# Patient Record
Sex: Male | Born: 1940
Health system: Southern US, Community
[De-identification: ages and names within clinical notes are randomized; demographics above are authoritative.]

## PROBLEM LIST (undated history)

## (undated) DIAGNOSIS — R001 Bradycardia, unspecified: Secondary | ICD-10-CM

## (undated) DIAGNOSIS — C20 Malignant neoplasm of rectum: Secondary | ICD-10-CM

## (undated) DIAGNOSIS — K635 Polyp of colon: Secondary | ICD-10-CM

## (undated) DIAGNOSIS — I10 Essential (primary) hypertension: Secondary | ICD-10-CM

## (undated) DIAGNOSIS — Z803 Family history of malignant neoplasm of breast: Secondary | ICD-10-CM

## (undated) DIAGNOSIS — Z9289 Personal history of other medical treatment: Secondary | ICD-10-CM

## (undated) DIAGNOSIS — E7800 Pure hypercholesterolemia, unspecified: Secondary | ICD-10-CM

## (undated) DIAGNOSIS — I480 Paroxysmal atrial fibrillation: Secondary | ICD-10-CM

## (undated) DIAGNOSIS — K219 Gastro-esophageal reflux disease without esophagitis: Secondary | ICD-10-CM

## (undated) DIAGNOSIS — G478 Other sleep disorders: Secondary | ICD-10-CM

## (undated) DIAGNOSIS — R1013 Epigastric pain: Secondary | ICD-10-CM

## (undated) DIAGNOSIS — C189 Malignant neoplasm of colon, unspecified: Secondary | ICD-10-CM

## (undated) HISTORY — DX: Pure hypercholesterolemia, unspecified: E78.00

## (undated) HISTORY — DX: Personal history of other medical treatment: Z92.89

## (undated) HISTORY — DX: Family history of malignant neoplasm of breast: Z80.3

## (undated) HISTORY — DX: Essential (primary) hypertension: I10

## (undated) HISTORY — PX: TONSILLECTOMY: SUR1361

## (undated) HISTORY — DX: Epigastric pain: R10.13

## (undated) HISTORY — DX: Bradycardia, unspecified: R00.1

## (undated) HISTORY — DX: Paroxysmal atrial fibrillation: I48.0

## (undated) HISTORY — PX: CATARACT EXTRACTION: SUR2

## (undated) HISTORY — PX: COLON SURGERY: SHX602

## (undated) HISTORY — DX: Malignant neoplasm of colon, unspecified: C18.9

## (undated) HISTORY — DX: Malignant neoplasm of rectum: C20

---

## 1990-05-01 DIAGNOSIS — C189 Malignant neoplasm of colon, unspecified: Secondary | ICD-10-CM

## 1990-05-01 HISTORY — DX: Malignant neoplasm of colon, unspecified: C18.9

## 2000-11-09 ENCOUNTER — Encounter: Admission: RE | Admit: 2000-11-09 | Discharge: 2000-11-09 | Payer: Self-pay | Admitting: Oncology

## 2000-11-09 ENCOUNTER — Encounter (HOSPITAL_COMMUNITY): Admission: RE | Admit: 2000-11-09 | Discharge: 2000-12-09 | Payer: Self-pay | Admitting: Oncology

## 2001-11-13 ENCOUNTER — Encounter (HOSPITAL_COMMUNITY): Admission: RE | Admit: 2001-11-13 | Discharge: 2001-12-13 | Payer: Self-pay | Admitting: Oncology

## 2001-11-13 ENCOUNTER — Encounter: Admission: RE | Admit: 2001-11-13 | Discharge: 2001-11-13 | Payer: Self-pay | Admitting: Oncology

## 2002-11-17 ENCOUNTER — Encounter: Admission: RE | Admit: 2002-11-17 | Discharge: 2002-11-17 | Payer: Self-pay | Admitting: Oncology

## 2002-11-17 ENCOUNTER — Encounter (HOSPITAL_COMMUNITY): Admission: RE | Admit: 2002-11-17 | Discharge: 2002-12-17 | Payer: Self-pay | Admitting: Oncology

## 2003-12-23 ENCOUNTER — Encounter: Admission: RE | Admit: 2003-12-23 | Discharge: 2003-12-23 | Payer: Self-pay | Admitting: Oncology

## 2003-12-23 ENCOUNTER — Encounter (HOSPITAL_COMMUNITY): Admission: RE | Admit: 2003-12-23 | Discharge: 2004-01-22 | Payer: Self-pay | Admitting: Oncology

## 2004-12-22 ENCOUNTER — Encounter: Admission: RE | Admit: 2004-12-22 | Discharge: 2004-12-22 | Payer: Self-pay | Admitting: Oncology

## 2004-12-22 ENCOUNTER — Ambulatory Visit (HOSPITAL_COMMUNITY): Payer: Self-pay | Admitting: Oncology

## 2004-12-22 ENCOUNTER — Encounter (HOSPITAL_COMMUNITY): Admission: RE | Admit: 2004-12-22 | Discharge: 2005-01-21 | Payer: Self-pay | Admitting: Oncology

## 2005-12-27 ENCOUNTER — Encounter (HOSPITAL_COMMUNITY): Admission: RE | Admit: 2005-12-27 | Discharge: 2006-01-26 | Payer: Self-pay | Admitting: Oncology

## 2005-12-27 ENCOUNTER — Encounter: Admission: RE | Admit: 2005-12-27 | Discharge: 2006-01-26 | Payer: Self-pay | Admitting: Oncology

## 2005-12-27 ENCOUNTER — Ambulatory Visit (HOSPITAL_COMMUNITY): Payer: Self-pay | Admitting: Oncology

## 2007-01-22 ENCOUNTER — Ambulatory Visit (HOSPITAL_COMMUNITY): Payer: Self-pay | Admitting: Oncology

## 2007-01-22 ENCOUNTER — Encounter (HOSPITAL_COMMUNITY): Admission: RE | Admit: 2007-01-22 | Discharge: 2007-01-29 | Payer: Self-pay | Admitting: Oncology

## 2008-02-11 ENCOUNTER — Encounter (HOSPITAL_COMMUNITY): Admission: RE | Admit: 2008-02-11 | Discharge: 2008-03-12 | Payer: Self-pay | Admitting: Oncology

## 2008-02-11 ENCOUNTER — Ambulatory Visit (HOSPITAL_COMMUNITY): Payer: Self-pay | Admitting: Oncology

## 2009-02-09 ENCOUNTER — Encounter (HOSPITAL_COMMUNITY): Admission: RE | Admit: 2009-02-09 | Discharge: 2009-03-11 | Payer: Self-pay | Admitting: Oncology

## 2009-02-09 ENCOUNTER — Ambulatory Visit (HOSPITAL_COMMUNITY): Payer: Self-pay | Admitting: Oncology

## 2010-02-09 ENCOUNTER — Ambulatory Visit (HOSPITAL_COMMUNITY): Payer: Self-pay | Admitting: Oncology

## 2010-08-04 LAB — DIFFERENTIAL
Basophils Absolute: 0 10*3/uL (ref 0.0–0.1)
Lymphocytes Relative: 25 % (ref 12–46)
Lymphs Abs: 1.1 10*3/uL (ref 0.7–4.0)
Monocytes Absolute: 0.3 10*3/uL (ref 0.1–1.0)
Neutro Abs: 2.9 10*3/uL (ref 1.7–7.7)

## 2010-08-04 LAB — COMPREHENSIVE METABOLIC PANEL
Albumin: 4.1 g/dL (ref 3.5–5.2)
BUN: 7 mg/dL (ref 6–23)
Calcium: 9 mg/dL (ref 8.4–10.5)
Chloride: 107 mEq/L (ref 96–112)
Creatinine, Ser: 0.83 mg/dL (ref 0.4–1.5)
Potassium: 3.9 mEq/L (ref 3.5–5.1)
Sodium: 139 mEq/L (ref 135–145)
Total Bilirubin: 0.8 mg/dL (ref 0.3–1.2)
Total Protein: 6.2 g/dL (ref 6.0–8.3)

## 2010-08-04 LAB — LIPID PANEL
HDL: 42 mg/dL (ref >39)
Total CHOL/HDL Ratio: 4.7 RATIO
Triglycerides: 184 mg/dL — ABNORMAL HIGH (ref <150)

## 2010-08-04 LAB — CBC
HCT: 41 % (ref 39.0–52.0)
Hemoglobin: 14.2 g/dL (ref 13.0–17.0)
RBC: 4.45 MIL/uL (ref 4.22–5.81)
WBC: 4.4 10*3/uL (ref 4.0–10.5)

## 2011-01-30 LAB — OCCULT BLOOD X 1 CARD TO LAB, STOOL
Fecal Occult Bld: NEGATIVE
Fecal Occult Bld: NEGATIVE

## 2011-01-30 LAB — DIFFERENTIAL
Eosinophils Relative: 2
Lymphocytes Relative: 24
Lymphs Abs: 1.1
Monocytes Absolute: 0.3
Monocytes Relative: 7

## 2011-01-30 LAB — CBC
MCHC: 34.6
MCV: 89.8
Platelets: 184
WBC: 4.6

## 2011-01-30 LAB — LIPID PANEL
HDL: 35 — ABNORMAL LOW
Total CHOL/HDL Ratio: 5

## 2011-01-30 LAB — COMPREHENSIVE METABOLIC PANEL
AST: 16
Albumin: 4
Calcium: 9.1
Creatinine, Ser: 0.89
GFR calc Af Amer: >60

## 2011-02-08 ENCOUNTER — Encounter (HOSPITAL_COMMUNITY): Payer: Self-pay | Admitting: Oncology

## 2011-02-08 ENCOUNTER — Encounter (HOSPITAL_COMMUNITY): Payer: Medicare Other | Attending: Oncology | Admitting: Oncology

## 2011-02-08 DIAGNOSIS — E7800 Pure hypercholesterolemia, unspecified: Secondary | ICD-10-CM

## 2011-02-08 DIAGNOSIS — R197 Diarrhea, unspecified: Secondary | ICD-10-CM

## 2011-02-08 DIAGNOSIS — Z79899 Other long term (current) drug therapy: Secondary | ICD-10-CM | POA: Insufficient documentation

## 2011-02-08 DIAGNOSIS — Z85048 Personal history of other malignant neoplasm of rectum, rectosigmoid junction, and anus: Secondary | ICD-10-CM | POA: Insufficient documentation

## 2011-02-08 DIAGNOSIS — C189 Malignant neoplasm of colon, unspecified: Secondary | ICD-10-CM

## 2011-02-08 DIAGNOSIS — Z09 Encounter for follow-up examination after completed treatment for conditions other than malignant neoplasm: Secondary | ICD-10-CM | POA: Insufficient documentation

## 2011-02-08 LAB — CBC
MCH: 30.5 pg (ref 26.0–34.0)
MCV: 91.2 fL (ref 78.0–100.0)
Platelets: 184 10*3/uL (ref 150–400)
RBC: 4.75 MIL/uL (ref 4.22–5.81)
RDW: 12.9 % (ref 11.5–15.5)

## 2011-02-08 LAB — LIPID PANEL
Cholesterol: 191 mg/dL (ref 0–200)
HDL: 47 mg/dL (ref >39)
LDL Cholesterol: 109 mg/dL — ABNORMAL HIGH (ref 0–99)
Triglycerides: 177 mg/dL — ABNORMAL HIGH (ref <150)

## 2011-02-08 LAB — DIFFERENTIAL
Basophils Absolute: 0 10*3/uL (ref 0.0–0.1)
Basophils Relative: 0 % (ref 0–1)
Monocytes Relative: 5 % (ref 3–12)
Neutro Abs: 3.5 10*3/uL (ref 1.7–7.7)
Neutrophils Relative %: 63 % (ref 43–77)

## 2011-02-08 LAB — COMPREHENSIVE METABOLIC PANEL
Albumin: 4 g/dL (ref 3.5–5.2)
BUN: 6 mg/dL (ref 6–23)
Chloride: 106 mEq/L (ref 96–112)
Creatinine, Ser: 0.81 mg/dL (ref 0.50–1.35)
Total Bilirubin: 0.3 mg/dL (ref 0.3–1.2)

## 2011-02-08 NOTE — Patient Instructions (Signed)
[facility]  Discharge Instructions  RECOMMENDATIONS MADE BY THE CONSULTANT AND ANY TEST RESULTS WILL BE SENT TO YOUR REFERRING DOCTOR.   EXAM FINDINGS BY MD TODAY AND SIGNS AND SYMPTOMS TO REPORT TO CLINIC OR PRIMARY MD: doing well  MEDICATIONS PRESCRIBED: lomotil Follow label directions  INSTRUCTIONS GIVEN AND DISCUSSED: Other: report bowel changes, blood in stool, etc  SPECIAL INSTRUCTIONS/FOLLOW-UP: Return to Clinic in 1 year.   I acknowledge that I have been informed and understand all the instructions given to me and received a copy. I do not have any more questions at this time, but understand that I may call the Specialty Clinic at [facility] at [phone] during business hours should I have any further questions or need assistance in obtaining follow-up care.    __________________________________________  _____________  __________ Signature of Patient or Authorized Representative            Date                   Time    __________________________________________ Nurse's Signature

## 2011-02-08 NOTE — Progress Notes (Signed)
CC:   Arthur Flavin, MD Arthur Obrien, M.D.  DIAGNOSES: 1. Stage III cancer of the rectum with resection earlier in 1992,     followed by combination chemotherapy with 5-FU, leucovorin and     levamisole in conjunction with radiation therapy to the pelvis by     Dr. Kathrin Greathouse, and he has had no evidence of recurrence thus     far.  His last colonoscopy was by Dr. Elmyra Ricks in November     2010 and for his next one, he would like to see Dr. Lionel Obrien.     Dr. Cleotis Nipper told him it will be 5 years from 2010. 2. Mild hypercholesterolemia.  His medications include just some red     yeast rice for this and he takes some fish oil at times. 3. Gaseous distention of his abdomen intermittently, for which he     takes Prilosec at times. 4. Diarrhea since his rectal surgery and radiation therapy, treated     with p.r.n. Lomotil, which works very well. 5. Minimal excess weight, unchanged. 6. Right cataract operation and lens implant in the past as well as     left implant in 2011. 7. Right distal index finger infection secondary to a laceration from     glass that is better, but he did get a tetanus toxoid booster in     October 2010. Arthur Obrien's review of systems oncologically is totally negative.  PHYSICAL EXAMINATION:  His vital signs are fine.  He still working 3 days weeks, soon to go to 2 days a week.  He weighs 196 pounds, which is where he has been really for quite some time.  He is 5 feet 10-1/2 inches is tall, and his BMI is 27.9.  Blood pressure today 165/83.  His blood pressures are occasionally high when he comes here due to mild anxiety.  Respirations 15 and unlabored.  Pulse 60 and regular.  He is afebrile.  He has no peripheral edema.  He is in no acute distress.  He has no thyromegaly.  No adenopathy in any location.  He has no gynecomastia.  Lungs:  Clear to auscultation and percussion.  Heart:  A regular rhythm and rate without murmur, rub or gallop.  Abdomen:   Soft and nontender with normal bowel sounds, no hepatosplenomegaly, no distention, no ascites.   He looks really quite good.  We will get some blood work on him, ship it to him and Dr. Dimas Aguas.  We will see him ourselves in a year, sooner if need be.    ______________________________ Ladona Horns. Mariel Sleet, MD ESN/MEDQ  D:  02/08/2011  T:  02/08/2011  Job:  [id number]

## 2011-02-08 NOTE — Progress Notes (Signed)
This office note has been dictated.

## 2011-02-09 LAB — COMPREHENSIVE METABOLIC PANEL
AST: 16
Albumin: 4
CO2: 29
Calcium: 9
Creatinine, Ser: 0.85
GFR calc Af Amer: >60
GFR calc non Af Amer: >60

## 2011-02-09 LAB — LIPID PANEL
HDL: 38 — ABNORMAL LOW
Triglycerides: 209 — ABNORMAL HIGH

## 2011-02-09 LAB — CBC
HCT: 40.7
MCHC: 34.6
MCV: 89.3
Platelets: 201
RDW: 14.1 — ABNORMAL HIGH

## 2011-02-09 LAB — DIFFERENTIAL
Eosinophils Relative: 1
Lymphocytes Relative: 25
Lymphs Abs: 1.1

## 2011-02-09 LAB — PSA: PSA: 0.28

## 2011-02-10 ENCOUNTER — Telehealth (HOSPITAL_COMMUNITY): Payer: Self-pay

## 2011-02-10 NOTE — Telephone Encounter (Signed)
Lab results faxed to Dr. Dimas Aguas per MD and patient's request.

## 2011-10-12 DIAGNOSIS — J342 Deviated nasal septum: Secondary | ICD-10-CM | POA: Diagnosis not present

## 2011-10-12 DIAGNOSIS — R040 Epistaxis: Secondary | ICD-10-CM | POA: Diagnosis not present

## 2012-01-16 ENCOUNTER — Other Ambulatory Visit (HOSPITAL_COMMUNITY): Payer: Self-pay | Admitting: Oncology

## 2012-01-16 DIAGNOSIS — R197 Diarrhea, unspecified: Secondary | ICD-10-CM

## 2012-01-16 MED ORDER — DIPHENOXYLATE-ATROPINE 2.5-0.025 MG PO TABS: ORAL_TABLET | ORAL | Status: DC

## 2012-01-18 DIAGNOSIS — H26499 Other secondary cataract, unspecified eye: Secondary | ICD-10-CM | POA: Diagnosis not present

## 2012-02-07 ENCOUNTER — Ambulatory Visit (HOSPITAL_COMMUNITY): Payer: Medicare Other | Admitting: Oncology

## 2012-02-08 ENCOUNTER — Encounter (HOSPITAL_COMMUNITY): Payer: Self-pay | Admitting: Oncology

## 2012-02-08 ENCOUNTER — Encounter (HOSPITAL_COMMUNITY): Payer: Medicare Other | Attending: Oncology | Admitting: Oncology

## 2012-02-08 VITALS — BP 154/71 | HR 60 | Temp 98.2°F | Resp 18 | Ht 71.0 in | Wt 201.6 lb

## 2012-02-08 DIAGNOSIS — Z85048 Personal history of other malignant neoplasm of rectum, rectosigmoid junction, and anus: Secondary | ICD-10-CM | POA: Insufficient documentation

## 2012-02-08 DIAGNOSIS — R197 Diarrhea, unspecified: Secondary | ICD-10-CM | POA: Insufficient documentation

## 2012-02-08 DIAGNOSIS — Z09 Encounter for follow-up examination after completed treatment for conditions other than malignant neoplasm: Secondary | ICD-10-CM | POA: Diagnosis not present

## 2012-02-08 DIAGNOSIS — C20 Malignant neoplasm of rectum: Secondary | ICD-10-CM

## 2012-02-08 DIAGNOSIS — E7800 Pure hypercholesterolemia, unspecified: Secondary | ICD-10-CM | POA: Diagnosis not present

## 2012-02-08 HISTORY — DX: Malignant neoplasm of rectum: C20

## 2012-02-08 HISTORY — DX: Pure hypercholesterolemia, unspecified: E78.00

## 2012-02-08 LAB — CBC WITH DIFFERENTIAL/PLATELET
Basophils Absolute: 0 10*3/uL (ref 0.0–0.1)
Basophils Relative: 0 % (ref 0–1)
Eosinophils Absolute: 0.1 10*3/uL (ref 0.0–0.7)
Eosinophils Relative: 2 % (ref 0–5)
MCH: 30.8 pg (ref 26.0–34.0)
MCHC: 34.2 g/dL (ref 30.0–36.0)
MCV: 90 fL (ref 78.0–100.0)
Platelets: 165 10*3/uL (ref 150–400)
RDW: 13 % (ref 11.5–15.5)
WBC: 5.1 10*3/uL (ref 4.0–10.5)

## 2012-02-08 LAB — LIPID PANEL
Cholesterol: 196 mg/dL (ref 0–200)
HDL: 43 mg/dL (ref >39)
LDL Cholesterol: 107 mg/dL — ABNORMAL HIGH (ref 0–99)
Triglycerides: 229 mg/dL — ABNORMAL HIGH (ref <150)
VLDL: 46 mg/dL — ABNORMAL HIGH (ref 0–40)

## 2012-02-08 LAB — COMPREHENSIVE METABOLIC PANEL
ALT: 14 U/L (ref 0–53)
AST: 14 U/L (ref 0–37)
Calcium: 9.2 mg/dL (ref 8.4–10.5)
Sodium: 140 mEq/L (ref 135–145)
Total Protein: 6.5 g/dL (ref 6.0–8.3)

## 2012-02-08 MED ORDER — DIPHENOXYLATE-ATROPINE 2.5-0.025 MG PO TABS: ORAL_TABLET | ORAL | Status: DC

## 2012-02-08 NOTE — Progress Notes (Signed)
No primary provider on file. No primary provider on file.  1. Rectal cancer  diphenoxylate-atropine (LOMOTIL) 2.5-0.025 MG per tablet  2. Hypercholesterolemia  Lipid panel  3. Diarrhea  diphenoxylate-atropine (LOMOTIL) 2.5-0.025 MG per tablet    CURRENT THERAPY: Observation  INTERVAL HISTORY: Arthur Obrien 71 y.o. male returns for  regular  Obrien for followup of Stage III cancer of the rectum with resection earlier in 1992, followed by combination chemotherapy with 5-FU, leucovorin and levamisole in conjunction with radiation therapy to the pelvis by Dr. Kathrin Greathouse, and he has had no evidence of recurrence thus far. His last colonoscopy was by Dr. Elmyra Ricks in November 2010 and for his next one, he would like to see Dr. Lionel December. Dr. Cleotis Nipper told him it will be 5 years from 2010.   Arthur Obrien is doing well.  He continues to have chronic loose stools.  He reports that he uses Lomotil to help with that, particularly when he is traveling.  This controls his loose stools.    Otherwise, he denies any changes in his stool size, color, caliber.  He denies any blood in his stool and black tarry stools.  He denies any "pencil-thin" stools.   Complete ROS questioning is negative.  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     Past Medical History  Diagnosis Date  . Colon cancer 1992    rectal stage 3  . Hypercholesteremia     mild  . Rectal cancer 02/08/2012    Stage III cancer of the rectum with resection earlier in 1992, followed by combination chemotherapy with 5-FU, leucovorin and levamisole in conjunction with radiation therapy to the pelvis by Dr. Kathrin Greathouse, and he has had no evidence of recurrence thus far. His last colonoscopy was by Dr. Elmyra Ricks in November 2010 and for his next one, he would like to see Dr. Lionel December.  Dr. Carley Hammed  . Hypercholesterolemia 02/08/2012    has Rectal cancer and  Hypercholesterolemia on his problem list.      has no known allergies.  Arthur Obrien.  Past Surgical History  Procedure Date  . Cataract extraction     right(years ago) left 06/2010    Denies any headaches, dizziness, double vision, fevers, chills, night sweats, nausea, vomiting, diarrhea, constipation, chest pain, heart palpitations, shortness of breath, blood in stool, black tarry stool, urinary pain, urinary burning, urinary frequency, hematuria.   PHYSICAL EXAMINATION  ECOG PERFORMANCE STATUS: 0 - Asymptomatic  Filed Vitals:   02/08/12 0900  BP: 154/71  Pulse: 60  Temp: 98.2 F (36.8 C)  Resp: 18    GENERAL:alert, healthy, no distress, well nourished, well developed, comfortable, cooperative and smiling SKIN: skin color, texture, turgor are normal, no rashes or significant lesions HEAD: Normocephalic, No masses, lesions, tenderness or abnormalities EYES: normal, Conjunctiva are pink and non-injected EARS: External ears normal OROPHARYNX:lips, buccal mucosa, and tongue normal and mucous membranes are moist  NECK: supple, no adenopathy, thyroid normal size, non-tender, without nodularity, no stridor, non-tender, trachea midline LYMPH:  no palpable lymphadenopathy, no hepatosplenomegaly BREAST:not examined LUNGS: clear to auscultation and percussion HEART: regular rate & rhythm, no murmurs, no gallops, S1 normal and S2 normal ABDOMEN:abdomen soft, non-tender, normal bowel sounds, no masses or organomegaly and no hepatosplenomegaly BACK: Back symmetric, no curvature., No CVA tenderness EXTREMITIES:less then 2 second capillary refill, no joint deformities, effusion, or inflammation, no edema,  no skin discoloration, no clubbing, no cyanosis  NEURO: alert & oriented x 3 with fluent speech, no focal motor/sensory deficits, gait normal     ASSESSMENT:  1. Stage III cancer of the rectum with resection earlier in 1992, followed  by combination chemotherapy with 5-FU, leucovorin and levamisole in conjunction with radiation therapy to the pelvis by Dr. Kathrin Greathouse, and he has had no evidence of recurrence thus far. His last colonoscopy was by Dr. Elmyra Ricks in November 2010 and for his next one, he would like to see Dr. Lionel December. Dr. Cleotis Nipper told him it will be 5 years from 2010.  2. Mild hypercholesterolemia. His medications include just some red yeast rice for this and he takes some fish oil at times.  3. Gaseous distention of his abdomen intermittently, for which he takes Prilosec at times.  4. Diarrhea since his rectal surgery and radiation therapy, treated with p.r.n. Lomotil, which works very well.  5. Minimal excess weight, unchanged.  6. Right cataract operation and lens implant in the past as well as left implant in 2011.  7. Right distal index finger infection secondary to a laceration from glass that is better, but he did get a tetanus toxoid booster in October 2010.   PLAN:  1. I personally reviewed and went over laboratory results with the patient. 2. Lab work today: CBC diff, CMET, CEA. 3. Due for screening colonoscopy in 2015.  He would like to see Dr. Karilyn Cota for this procedure.  4. Rx for Lomotil #100 with 4 refills for his diarrhea.  5. Return in 1 year for follow-up.    All questions were answered. The patient knows to call the clinic with any problems, questions or concerns. We can certainly see the patient much sooner if necessary.  Arthur Obrien

## 2012-02-08 NOTE — Patient Instructions (Addendum)
[facility]  Discharge Instructions  RECOMMENDATIONS MADE BY THE CONSULTANT AND ANY TEST RESULTS WILL BE SENT TO YOUR REFERRING DOCTOR.   Lab work today. We will call you if there are any abnormal lab results. RX given to pt for Lomotil. Return to clinic in 1 year. Report any issues/concerns to clinic as needed.   I acknowledge that I have been informed and understand all the instructions given to me and received a copy. I do not have any more questions at this time, but understand that I may call the Specialty Clinic at [facility] at [phone] during business hours should I have any further questions or need assistance in obtaining follow-up care.    __________________________________________  _____________  __________ Signature of Patient or Authorized Representative            Date                   Time    __________________________________________ Nurse's Signature

## 2012-02-12 DIAGNOSIS — Z23 Encounter for immunization: Secondary | ICD-10-CM | POA: Diagnosis not present

## 2012-03-05 DIAGNOSIS — R7309 Other abnormal glucose: Secondary | ICD-10-CM | POA: Diagnosis not present

## 2012-03-05 DIAGNOSIS — I10 Essential (primary) hypertension: Secondary | ICD-10-CM | POA: Diagnosis not present

## 2012-03-05 DIAGNOSIS — E7800 Pure hypercholesterolemia, unspecified: Secondary | ICD-10-CM | POA: Diagnosis not present

## 2012-05-23 DIAGNOSIS — E039 Hypothyroidism, unspecified: Secondary | ICD-10-CM | POA: Diagnosis not present

## 2012-05-23 DIAGNOSIS — R002 Palpitations: Secondary | ICD-10-CM | POA: Diagnosis not present

## 2012-05-23 DIAGNOSIS — Z0000 Encounter for general adult medical examination without abnormal findings: Secondary | ICD-10-CM | POA: Diagnosis not present

## 2012-06-26 DIAGNOSIS — Z808 Family history of malignant neoplasm of other organs or systems: Secondary | ICD-10-CM | POA: Diagnosis not present

## 2012-06-26 DIAGNOSIS — Z85038 Personal history of other malignant neoplasm of large intestine: Secondary | ICD-10-CM | POA: Diagnosis not present

## 2012-06-26 DIAGNOSIS — R079 Chest pain, unspecified: Secondary | ICD-10-CM | POA: Diagnosis not present

## 2012-06-26 DIAGNOSIS — Z8489 Family history of other specified conditions: Secondary | ICD-10-CM | POA: Diagnosis not present

## 2012-06-26 DIAGNOSIS — Z79899 Other long term (current) drug therapy: Secondary | ICD-10-CM | POA: Diagnosis not present

## 2012-06-26 DIAGNOSIS — I4891 Unspecified atrial fibrillation: Secondary | ICD-10-CM | POA: Diagnosis not present

## 2012-08-21 ENCOUNTER — Encounter: Payer: Self-pay | Admitting: Internal Medicine

## 2012-09-26 ENCOUNTER — Ambulatory Visit (INDEPENDENT_AMBULATORY_CARE_PROVIDER_SITE_OTHER): Payer: Medicare Other | Admitting: Internal Medicine

## 2012-09-26 ENCOUNTER — Encounter: Payer: Self-pay | Admitting: Internal Medicine

## 2012-09-26 VITALS — BP 158/80 | HR 62 | Ht 71.0 in | Wt 200.8 lb

## 2012-09-26 DIAGNOSIS — I4891 Unspecified atrial fibrillation: Secondary | ICD-10-CM

## 2012-09-26 DIAGNOSIS — I498 Other specified cardiac arrhythmias: Secondary | ICD-10-CM

## 2012-09-26 DIAGNOSIS — R0609 Other forms of dyspnea: Secondary | ICD-10-CM

## 2012-09-26 DIAGNOSIS — C20 Malignant neoplasm of rectum: Secondary | ICD-10-CM | POA: Diagnosis not present

## 2012-09-26 DIAGNOSIS — E7800 Pure hypercholesterolemia, unspecified: Secondary | ICD-10-CM | POA: Diagnosis not present

## 2012-09-26 DIAGNOSIS — I480 Paroxysmal atrial fibrillation: Secondary | ICD-10-CM

## 2012-09-26 DIAGNOSIS — R001 Bradycardia, unspecified: Secondary | ICD-10-CM

## 2012-09-26 DIAGNOSIS — R0683 Snoring: Secondary | ICD-10-CM

## 2012-09-26 HISTORY — DX: Bradycardia, unspecified: R00.1

## 2012-09-26 HISTORY — DX: Paroxysmal atrial fibrillation: I48.0

## 2012-09-26 MED ORDER — PROPRANOLOL HCL 10 MG PO TABS: ORAL_TABLET | ORAL | Status: DC

## 2012-09-26 NOTE — Assessment & Plan Note (Addendum)
The patient has largely asymptomatic paroxysmal atrial fibrillation not withstanding the quite rapid rate. He has bradycardia which we'll make rate control difficult. We will use when necessary beta blockers to help when necessary. In the case that this is inadequate,  Would consider the use of dofetilide as an antiarrhythmic drug which is unassociated with significant heart rate slowing.  the use of a 1C requires AV nodal blocking agents and in him  This would be problematic. In addition, his left atrium of 47 mm might make these drugs less likely effective. In the event that symptoms persisted despite the use of dofetilide, I would have a low threshold for considering catheter ablation.  We also discussed anticoagulation. His early discussions with Dr. GD I suggested that he should not be on anticoagulation. His  chads2 score is hard to tell because his age is 49 and his blood pressure is borderline. His CHADS-VASc score is 1+.. I would have a low threshold for the use of anticoagulation but will hold off based on prior discussions he had with Dr. GD right now

## 2012-09-26 NOTE — Progress Notes (Signed)
ELECTROPHYSIOLOGY CONSULT NOTE  Patient ID: Arthur Obrien, MRN: [id number], DOB/AGE: 1941-04-02 72 y.o. Admit date: (Not on file) Date of Consult: 09/26/2012  Primary Physician: No primary provider on file. Primary Cardiologist GD  Chief Complaint: afib   HPI Arthur Obrien is a 72 y.o. male  ( note written days after visit>>A&P written day pt was here With a history of afib with rapid rates and complicated by sinus bradycardia  He had been seeing dr GD until he left, and there were plans for sleep study Episodes of atrial fibrillation relatively asymptomatic  TERF age-13>CHADS almost 1  Prior.  Cardiac eval>> EF normal and LA 47 mm     * Past Medical History  Diagnosis Date  . Colon cancer 1992    rectal stage 3  . Hypercholesteremia     mild  . Rectal cancer 02/08/2012    Stage III cancer of the rectum with resection earlier in 1992, followed by combination chemotherapy with 5-FU, leucovorin and levamisole in conjunction with radiation therapy to the pelvis by Dr. Kathrin Greathouse, and he has had no evidence of recurrence thus far. His last colonoscopy was by Dr. Elmyra Ricks in November 2010 and for his next one, he would like to see Dr. Lionel December.  Dr. Carley Hammed  . Hypercholesterolemia 02/08/2012      Surgical History:  Past Surgical History  Procedure Laterality Date  . Cataract extraction      right(years ago) left 06/2010     Home Meds: Prior to Admission medications   Medication Sig Start Date End Date Taking? Authorizing Provider  cyanocobalamin 1000 MCG tablet Take 1,000 mcg by mouth daily.   Yes Historical Provider, MD  diltiazem (CARDIZEM) 30 MG tablet Take 30 mg by mouth as needed.   Yes Historical Provider, MD  diphenoxylate-atropine (LOMOTIL) 2.5-0.025 MG per tablet Take 1-2 tablets PO every 6 hours PRN diarrhea 02/08/12  Yes Maurine Minister Kefalas, PA-C  ibuprofen (ADVIL,MOTRIN) 800 MG tablet Take 800 mg by mouth every 8 (eight) hours as needed.     Yes  Historical Provider, MD  Multiple Vitamin (MULTIVITAMIN) tablet Take 1 tablet by mouth daily.     Yes Historical Provider, MD      Allergies: No Known Allergies  History   Social History  . Marital Status: Married    Spouse Name: Arthur Obrien    Number of Children: Arthur Obrien  . Years of Education: Arthur Obrien   Occupational History  . Not on file.   Social History Main Topics  . Smoking status: Former Games developer  . Smokeless tobacco: Never Used  . Alcohol Use: No  . Drug Use: No  . Sexually Active: No   Other Topics Concern  . Not on file   Social History Narrative  . No narrative on file     Family History  Problem Relation Age of Onset  . Cancer Father     multiple myeloma  . Cancer Daughter     metastatic breast     ROS:  Please see the history of present illness.     All other systems reviewed and negative.    Physical Exam:*   Blood pressure 158/80, pulse 62, height 5\' 11"  (1.803 m), weight 200 lb 12.8 oz (91.082 kg). General: Well developed, well nourished male in no acute distress. Head: Normocephalic, atraumatic, sclera non-icteric, no xanthomas, nares are without discharge. EENT: normal Lymph Nodes:  none Back: without scoliosis/kyphosis, no CVA tendersness Neck: Negative for carotid bruits. JVD not  elevated. Lungs: Clear bilaterally to auscultation without wheezes, rales, or rhonchi. Breathing is unlabored. Heart: RRR with S1 S2. No  murmur , rubs, or gallops appreciated. Abdomen: Soft, non-tender, non-distended with normoactive bowel sounds. No hepatomegaly. No rebound/guarding. No obvious abdominal masses. Msk:  Strength and tone appear normal for age. Extremities: No clubbing or cyanosis. No  edema.  Distal pedal pulses are 2+ and equal bilaterally. Skin: Warm and Dry Neuro: Alert and oriented X 3. CN III-XII intact Grossly normal sensory and motor function . Psych:  Responds to questions appropriately with a normal affect.      Labs: Cardiac Enzymes No results  found for this basename: CKTOTAL, CKMB, TROPONINI,  in the last 72 hours CBC Lab Results  Component Value Date   WBC 5.1 02/08/2012   HGB 14.4 02/08/2012   HCT 42.1 02/08/2012   MCV 90.0 02/08/2012   PLT 165 02/08/2012   PROTIME: No results found for this basename: LABPROT, INR,  in the last 72 hours Chemistry No results found for this basename: NA, K, CL, CO2, BUN, CREATININE, CALCIUM, LABALBU, PROT, BILITOT, ALKPHOS, ALT, AST, GLUCOSE,  in the last 168 hours Lipids Lab Results  Component Value Date   CHOL 196 02/08/2012   HDL 43 02/08/2012   LDLCALC 107* 02/08/2012   TRIG 229* 02/08/2012   BNP No results found for this basename: probnp   Miscellaneous No results found for this basename: DDIMER    Radiology/Studies:  No results found.  EKG: sinus rhythm at 62 Intervals 16/08/39 Left atrial enlargement  Echocardiogram 4/14 normal left ventricular function left atrial dimension 47 Holter monitor/event recorder paroxysmal atrial fibrillation with heart rates ranging from 45 at night to 210 during the day. Also has sinus rhythm with rates in the 50s during the day   Assessment and Plan:    Sherryl Manges

## 2012-09-26 NOTE — Assessment & Plan Note (Signed)
As above.

## 2012-09-26 NOTE — Patient Instructions (Addendum)
Your physician has recommended that you have a sleep study in [place]. This test records several body functions during sleep, including: brain activity, eye movement, oxygen and carbon dioxide blood levels, heart rate and rhythm, breathing rate and rhythm, the flow of air through your mouth and nose, snoring, body muscle movements, and chest and belly movement.  Your physician has recommended you make the following change in your medication:  1) start inderal (propranolol) 10 mg one tablet as needed  Your physician recommends that you schedule a follow-up appointment in: 3 months with Dr. Graciela Husbands.

## 2012-10-15 ENCOUNTER — Telehealth: Payer: Self-pay | Admitting: Internal Medicine

## 2012-10-15 NOTE — Telephone Encounter (Signed)
New Prob    Pt has some questions regarding an appt he has scheduled for 6/23. Appt looks like its coming from pulmonary, however, Dr. Koren Bound name is in the appointment notes. Please call.

## 2012-10-15 NOTE — Telephone Encounter (Signed)
Spoke with pt, he is scheduled for PFT's on 6/23. He has not had his sleep study as of yet and he wonders if this was to be for his son instead of him. Left message for dr Caryn Bee howard's office to call me back to discuss.

## 2012-10-16 DIAGNOSIS — Z85038 Personal history of other malignant neoplasm of large intestine: Secondary | ICD-10-CM | POA: Diagnosis not present

## 2012-10-16 DIAGNOSIS — I4891 Unspecified atrial fibrillation: Secondary | ICD-10-CM | POA: Diagnosis not present

## 2012-10-16 DIAGNOSIS — Z6828 Body mass index (BMI) 28.0-28.9, adult: Secondary | ICD-10-CM | POA: Diagnosis not present

## 2012-10-16 DIAGNOSIS — G4710 Hypersomnia, unspecified: Secondary | ICD-10-CM | POA: Diagnosis not present

## 2012-10-16 DIAGNOSIS — Z79899 Other long term (current) drug therapy: Secondary | ICD-10-CM | POA: Diagnosis not present

## 2012-10-16 DIAGNOSIS — G4761 Periodic limb movement disorder: Secondary | ICD-10-CM | POA: Diagnosis not present

## 2012-10-17 NOTE — Telephone Encounter (Signed)
F/u   Pt called again and stated dr Dimas Aguas did not schedule this appt

## 2012-10-17 NOTE — Telephone Encounter (Signed)
Discussed with dr Graciela Husbands, this pt does not need PFT's. Left message for pt that appt has been cancelled.

## 2012-12-04 ENCOUNTER — Other Ambulatory Visit: Payer: Self-pay

## 2012-12-27 ENCOUNTER — Ambulatory Visit (INDEPENDENT_AMBULATORY_CARE_PROVIDER_SITE_OTHER): Payer: Medicare Other | Admitting: Internal Medicine

## 2012-12-27 ENCOUNTER — Encounter: Payer: Self-pay | Admitting: Internal Medicine

## 2012-12-27 VITALS — BP 162/74 | HR 58 | Ht 71.0 in | Wt 194.0 lb

## 2012-12-27 DIAGNOSIS — R001 Bradycardia, unspecified: Secondary | ICD-10-CM

## 2012-12-27 DIAGNOSIS — I4891 Unspecified atrial fibrillation: Secondary | ICD-10-CM

## 2012-12-27 DIAGNOSIS — R030 Elevated blood-pressure reading, without diagnosis of hypertension: Secondary | ICD-10-CM | POA: Diagnosis not present

## 2012-12-27 DIAGNOSIS — I498 Other specified cardiac arrhythmias: Secondary | ICD-10-CM | POA: Diagnosis not present

## 2012-12-27 NOTE — Patient Instructions (Addendum)
The current medical regimen is effective;  continue present plan and medications.  Follow up in 1 year with Dr Graciela Husbands.  You will receive a letter in the mail 2 months before you are due.  Please call us when you receive this letter to schedule your follow up appointment.

## 2012-12-27 NOTE — Progress Notes (Signed)
Patient has no care team.   HPI  Arthur Obrien is a 72 y.o. male Seen in followup for atrial fibrillation with a moderately rapid ventricular response in the setting of sinus bradycardia.  For thromboembolic risk factors are notable for borderline hypertension and borderline age 81 a CHADS-2 score of one  And a CHADS-VASc score of 2.   The patient denies chest pain, shortness of breath, nocturnal dyspnea, orthopnea or peripheral edema.  There have been no palpitations, lightheadedness or syncope.   His sleep study results are not available  Past Medical History  Diagnosis Date  . Colon cancer 1992    rectal stage 3  . Hypercholesteremia     mild  . Rectal cancer 02/08/2012    Stage III cancer of the rectum with resection earlier in 1992, followed by combination chemotherapy with 5-FU, leucovorin and levamisole in conjunction with radiation therapy to the pelvis by Dr. Kathrin Greathouse, and he has had no evidence of recurrence thus far. His last colonoscopy was by Dr. Elmyra Ricks in November 2010 and for his next one, he would like to see Dr. Lionel December.  Dr. Carley Hammed  . Hypercholesterolemia 02/08/2012  . Atrial fibrillation 09/26/2012  . Sinus bradycardia 09/26/2012    Past Surgical History  Procedure Laterality Date  . Cataract extraction      right(years ago) left 06/2010    Current Outpatient Prescriptions  Medication Sig Dispense Refill  . cyanocobalamin 1000 MCG tablet Take 1,000 mcg by mouth daily.      . diphenoxylate-atropine (LOMOTIL) 2.5-0.025 MG per tablet Take 1-2 tablets PO every 6 hours PRN diarrhea  100 tablet  4  . Multiple Vitamin (MULTIVITAMIN) tablet Take 1 tablet by mouth daily.        . propranolol (INDERAL) 10 MG tablet Take one tablet by mouth as needed  30 tablet  3   No current facility-administered medications for this visit.    No Known Allergies  Review of Systems negative except from HPI and PMH  Physical Exam BP 162/74  Pulse 58  Ht 5'  11" (1.803 m)  Wt 194 lb (87.998 kg)  BMI 27.07 kg/m2 Well developed and nourished in no acute distress HENT normal Neck supple with JVP-flat Clear Regular rate and rhythm, no murmurs or gallops Abd-soft with active BS No Clubbing cyanosis edema Skin-warm and dry A & Oriented  Grossly normal sensory and motor function  ECG demonstrates sinus rhythm at 58 Intervals 17/08/22 Axis XXXIV  Assessment and  Plan

## 2012-12-27 NOTE — Assessment & Plan Note (Signed)
Ihave asked him to follow outpatient blood pressures. In the past they have been in the range of 130-140/75-90 This would  have an impact on anticoagulation recommendations

## 2012-12-27 NOTE — Assessment & Plan Note (Signed)
Paroxysmal. Using when necessary beta blockers. We discussed anticoagulation and antiplatelet therapy. I've advised him that I would not take antiplatelet therapy. If blood pressure remains elevated, I would consider the use of apixaban

## 2012-12-27 NOTE — Assessment & Plan Note (Signed)
wtable

## 2013-02-03 DIAGNOSIS — Z23 Encounter for immunization: Secondary | ICD-10-CM | POA: Diagnosis not present

## 2013-02-07 ENCOUNTER — Encounter (HOSPITAL_COMMUNITY): Payer: Medicare Other | Attending: Hematology and Oncology

## 2013-02-07 ENCOUNTER — Encounter (HOSPITAL_COMMUNITY): Payer: Self-pay

## 2013-02-07 VITALS — BP 176/92 | HR 59 | Temp 97.9°F | Resp 16 | Wt 196.4 lb

## 2013-02-07 DIAGNOSIS — Z85048 Personal history of other malignant neoplasm of rectum, rectosigmoid junction, and anus: Secondary | ICD-10-CM | POA: Diagnosis not present

## 2013-02-07 DIAGNOSIS — Z09 Encounter for follow-up examination after completed treatment for conditions other than malignant neoplasm: Secondary | ICD-10-CM | POA: Diagnosis not present

## 2013-02-07 DIAGNOSIS — C20 Malignant neoplasm of rectum: Secondary | ICD-10-CM

## 2013-02-07 LAB — CBC WITH DIFFERENTIAL/PLATELET
Basophils Absolute: 0 10*3/uL (ref 0.0–0.1)
Basophils Relative: 0 % (ref 0–1)
Eosinophils Absolute: 0.1 10*3/uL (ref 0.0–0.7)
HCT: 40.2 % (ref 39.0–52.0)
MCH: 31.2 pg (ref 26.0–34.0)
MCHC: 34.6 g/dL (ref 30.0–36.0)
Monocytes Absolute: 0.4 10*3/uL (ref 0.1–1.0)
Neutro Abs: 3.4 10*3/uL (ref 1.7–7.7)
Neutrophils Relative %: 67 % (ref 43–77)
RDW: 12.9 % (ref 11.5–15.5)

## 2013-02-07 LAB — COMPREHENSIVE METABOLIC PANEL
AST: 12 U/L (ref 0–37)
Albumin: 3.6 g/dL (ref 3.5–5.2)
Chloride: 105 mEq/L (ref 96–112)
Creatinine, Ser: 0.86 mg/dL (ref 0.50–1.35)
Total Bilirubin: 0.3 mg/dL (ref 0.3–1.2)
Total Protein: 6.6 g/dL (ref 6.0–8.3)

## 2013-02-07 LAB — FERRITIN: Ferritin: 44 ng/mL (ref 22–322)

## 2013-02-07 MED ORDER — DIPHENOXYLATE-ATROPINE 2.5-0.025 MG PO TABS: 1.0000 | ORAL_TABLET | Freq: Four times a day (QID) | ORAL | Status: DC | PRN

## 2013-02-07 NOTE — Patient Instructions (Signed)
[facility] Discharge Instructions  RECOMMENDATIONS MADE BY THE CONSULTANT AND ANY TEST RESULTS WILL BE SENT TO YOUR REFERRING PHYSICIAN.  EXAM FINDINGS BY THE PHYSICIAN TODAY AND SIGNS OR SYMPTOMS TO REPORT TO CLINIC OR PRIMARY PHYSICIAN: Exam and findings as discussed by Dr. Zigmund Daniel.  MEDICATIONS PRESCRIBED:  1.  Lomotil - take as prescribed as needed for diarrhea symptoms  INSTRUCTIONS/FOLLOW-UP: 1.  We performed labs today and all results should be in by Tuesday.  Please call by Tuesday afternoon to have [place] fax your labs to you. 2.  Return in 1 year for a follow-up to see the physician again.  Thank you for choosing [facility] to provide your oncology and hematology care.  To afford each patient quality time with our providers, please arrive at least 15 minutes before your scheduled appointment time.  With your help, our goal is to use those 15 minutes to complete the necessary work-up to ensure our physicians have the information they need to help with your evaluation and healthcare recommendations.    Effective January 1st, 2014, we ask that you re-schedule your appointment with our physicians should you arrive 10 or more minutes late for your appointment.  We strive to give you quality time with our providers, and arriving late affects you and other patients whose appointments are after yours.    Again, thank you for choosing [facility].  Our hope is that these requests will decrease the amount of time that you wait before being seen by our physicians.       _____________________________________________________________  Should you have questions after your visit to [facility], please contact our office at [phone] between the hours of 8:30 a.m. and 5:00 p.m.  Voicemails left after 4:30 p.m. will not be returned until the following business day.  For prescription refill requests, have your pharmacy contact our office  with your prescription refill request.

## 2013-02-07 NOTE — Progress Notes (Signed)
[facility] OFFICE PROGRESS NOTE  No primary provider on file. No primary provider on file.  DIAGNOSIS: Rectal cancer - Plan: CBC with Differential, Comprehensive metabolic panel, Ferritin, CEA, CBC with Differential, Comprehensive metabolic panel, Ferritin, CEA  Chief Complaint  Patient presents with  . Rectal Cancer    CURRENT THERAPY: Watchful expectation.  INTERVAL HISTORY: Arthur Obrien 72 y.o. male returns for followup of rectal cancer treated in 1992. He continues to do well except for episodic diarrhea. He denies any melena or hematochezia, hematemesis, nausea, vomiting, fever, night sweats, lower extremity swelling or redness, craving for ice, cough, wheezing, nasal drip, skin rash, PND, orthopnea, palpitations, headache, or seizures.   MEDICAL HISTORY: Past Medical History  Diagnosis Date  . Colon cancer 1992    rectal stage 3  . Hypercholesteremia     mild  . Rectal cancer 02/08/2012    Stage III cancer of the rectum with resection earlier in 1992, followed by combination chemotherapy with 5-FU, leucovorin and levamisole in conjunction with radiation therapy to the pelvis by Dr. Kathrin Greathouse, and he has had no evidence of recurrence thus far. His last colonoscopy was by Dr. Elmyra Ricks in November 2010 and for his next one, he would like to see Dr. Lionel December.  Dr. Carley Hammed  . Hypercholesterolemia 02/08/2012  . Atrial fibrillation 09/26/2012  . Sinus bradycardia 09/26/2012    INTERIM HISTORY: has Rectal cancer; Hypercholesterolemia; Atrial fibrillation; Sinus bradycardia; and Elevated blood pressure on his problem list.   Stage III cancer of the rectum with resection earlier in 1992, followed by combination chemotherapy with 5-FU, leucovorin and levamisole in conjunction with radiation therapy to the pelvis by Dr. Kathrin Greathouse, and he has had no evidence of recurrence thus far. His last colonoscopy was by Dr. Elmyra Ricks in November 2010 and  for his next one, he would like to see Dr. Lionel December. Dr. Cleotis Nipper told him it will be 5 years from 2010  ALLERGIES:  has No Known Allergies.  MEDICATIONS: has a current medication list which includes the following prescription(s): cyanocobalamin, diphenoxylate-atropine, multivitamin, propranolol, and diphenoxylate-atropine.  SURGICAL HISTORY:  Past Surgical History  Procedure Laterality Date  . Cataract extraction      right(years ago) left 06/2010  . Colon surgery    . Tonsillectomy      FAMILY HISTORY: family history includes Cancer in his daughter and father.  SOCIAL HISTORY:  reports that he has never smoked. He has never used smokeless tobacco. He reports that he does not drink alcohol or use illicit drugs.  REVIEW OF SYSTEMS:  Other than that discussed above is noncontributory.  PHYSICAL EXAMINATION: ECOG PERFORMANCE STATUS: 0 - Asymptomatic  Blood pressure 176/92, pulse 59, temperature 97.9 F (36.6 C), temperature source Oral, resp. rate 16, weight 196 lb 6.4 oz (89.086 kg).  GENERAL:alert, no distress and comfortable SKIN: skin color, texture, turgor are normal, no rashes or significant lesions EYES: PERLA; Conjunctiva are pink and non-injected, sclera clear OROPHARYNX:no exudate, no erythema on lips, buccal mucosa, or tongue. NECK: supple, thyroid normal size, non-tender, without nodularity. No masses CHEST: Normal AP diameter with no gynecomastia. LYMPH:  no palpable lymphadenopathy in the cervical, axillary or inguinal LUNGS: clear to auscultation and percussion with normal breathing effort HEART: regular rate & rhythm and no murmurs and no lower extremity edema ABDOMEN:abdomen soft, non-tender and normal bowel sounds MUSCULOSKELETAL:no cyanosis of digits and no clubbing. Range of motion normal.  NEURO: alert & oriented x 3 with  fluent speech, no focal motor/sensory deficits   LABORATORY DATA: Office Visit on 02/07/2013  Component Date Value Range Status    . WBC 02/07/2013 5.1  4.0 - 10.5 K/uL Final  . RBC 02/07/2013 4.46  4.22 - 5.81 MIL/uL Final  . Hemoglobin 02/07/2013 13.9  13.0 - 17.0 g/dL Final  . HCT 16/01/9603 40.2  39.0 - 52.0 % Final  . MCV 02/07/2013 90.1  78.0 - 100.0 fL Final  . MCH 02/07/2013 31.2  26.0 - 34.0 pg Final  . MCHC 02/07/2013 34.6  30.0 - 36.0 g/dL Final  . RDW 54/12/8117 12.9  11.5 - 15.5 % Final  . Platelets 02/07/2013 181  150 - 400 K/uL Final  . Neutrophils Relative % 02/07/2013 67  43 - 77 % Final  . Neutro Abs 02/07/2013 3.4  1.7 - 7.7 K/uL Final  . Lymphocytes Relative 02/07/2013 24  12 - 46 % Final  . Lymphs Abs 02/07/2013 1.2  0.7 - 4.0 K/uL Final  . Monocytes Relative 02/07/2013 8  3 - 12 % Final  . Monocytes Absolute 02/07/2013 0.4  0.1 - 1.0 K/uL Final  . Eosinophils Relative 02/07/2013 1  0 - 5 % Final  . Eosinophils Absolute 02/07/2013 0.1  0.0 - 0.7 K/uL Final  . Basophils Relative 02/07/2013 0  0 - 1 % Final  . Basophils Absolute 02/07/2013 0.0  0.0 - 0.1 K/uL Final    PATHOLOGY: None new.  Urinalysis No results found for this basename: colorurine,  appearanceur,  labspec,  phurine,  glucoseu,  hgbur,  bilirubinur,  ketonesur,  proteinur,  urobilinogen,  nitrite,  leukocytesur    RADIOGRAPHIC STUDIES: No results found.  ASSESSMENT:1. Stage III cancer of the rectum with resection earlier in 1992, followed by combination chemotherapy with 5-FU, leucovorin and levamisole in conjunction with radiation therapy to the pelvis by Dr. Kathrin Greathouse, and he has had no evidence of recurrence thus far. His last colonoscopy was by Dr. Elmyra Ricks in November 2010 and for his next one, he would like to see Dr. Lionel December. Dr. Cleotis Nipper told him it will be 5 years from 2010, currently clinically without evidence of disease  #2. Intermittent diarrhea secondary to previous therapy. #3. Hypercholesterolemia, diet controlled.   PLAN: #1. CBC, chem profile, and CEA today. #2. Reminded to call Dr.  Karilyn Cota for colonoscopy and 2015. #3. Followup in one year with lab tests and physical exam. Patient was told to call should any new symptoms occur that are troublesome and persistent.   All questions were answered. The patient knows to call the clinic with any problems, questions or concerns. We can certainly see the patient much sooner if necessary.   I spent 30 minutes counseling the patient face to face. The total time spent in the appointment was 25 minutes.    Maurilio Lovely, MD 02/07/2013 10:32 AM

## 2013-02-08 LAB — CEA: CEA: <0.5 ng/mL (ref 0.0–5.0)

## 2013-02-10 ENCOUNTER — Encounter (HOSPITAL_COMMUNITY): Payer: Self-pay

## 2013-02-13 ENCOUNTER — Encounter: Payer: Self-pay | Admitting: Internal Medicine

## 2013-03-06 ENCOUNTER — Other Ambulatory Visit: Payer: Self-pay

## 2014-02-02 DIAGNOSIS — Z23 Encounter for immunization: Secondary | ICD-10-CM | POA: Diagnosis not present

## 2014-02-06 ENCOUNTER — Encounter (HOSPITAL_COMMUNITY): Payer: Medicare Other | Attending: Hematology

## 2014-02-06 ENCOUNTER — Encounter (HOSPITAL_BASED_OUTPATIENT_CLINIC_OR_DEPARTMENT_OTHER): Payer: Medicare Other

## 2014-02-06 VITALS — BP 145/64 | HR 60 | Temp 97.7°F | Resp 20 | Wt 194.8 lb

## 2014-02-06 DIAGNOSIS — C20 Malignant neoplasm of rectum: Secondary | ICD-10-CM | POA: Diagnosis not present

## 2014-02-06 DIAGNOSIS — Z85048 Personal history of other malignant neoplasm of rectum, rectosigmoid junction, and anus: Secondary | ICD-10-CM

## 2014-02-06 LAB — CBC WITH DIFFERENTIAL/PLATELET
BASOS ABS: 0 10*3/uL (ref 0.0–0.1)
BASOS PCT: 0 % (ref 0–1)
EOS ABS: 0.1 10*3/uL (ref 0.0–0.7)
EOS PCT: 2 % (ref 0–5)
HCT: 40.1 % (ref 39.0–52.0)
Hemoglobin: 13.9 g/dL (ref 13.0–17.0)
Lymphocytes Relative: 33 % (ref 12–46)
Lymphs Abs: 1.7 10*3/uL (ref 0.7–4.0)
MCH: 31.3 pg (ref 26.0–34.0)
MCHC: 34.7 g/dL (ref 30.0–36.0)
MCV: 90.3 fL (ref 78.0–100.0)
Monocytes Absolute: 0.4 10*3/uL (ref 0.1–1.0)
Monocytes Relative: 8 % (ref 3–12)
Neutro Abs: 2.9 10*3/uL (ref 1.7–7.7)
Neutrophils Relative %: 57 % (ref 43–77)
PLATELETS: 177 10*3/uL (ref 150–400)
RBC: 4.44 MIL/uL (ref 4.22–5.81)
RDW: 13 % (ref 11.5–15.5)
WBC: 5.1 10*3/uL (ref 4.0–10.5)

## 2014-02-06 LAB — COMPREHENSIVE METABOLIC PANEL
ALT: 13 U/L (ref 0–53)
AST: 14 U/L (ref 0–37)
Albumin: 3.7 g/dL (ref 3.5–5.2)
Alkaline Phosphatase: 91 U/L (ref 39–117)
Anion gap: 11 (ref 5–15)
BUN: 18 mg/dL (ref 6–23)
CALCIUM: 9.3 mg/dL (ref 8.4–10.5)
CO2: 26 mEq/L (ref 19–32)
Chloride: 106 mEq/L (ref 96–112)
Creatinine, Ser: 0.96 mg/dL (ref 0.50–1.35)
GFR calc non Af Amer: 79 mL/min — ABNORMAL LOW (ref >90)
GLUCOSE: 103 mg/dL — AB (ref 70–99)
Potassium: 4.3 mEq/L (ref 3.7–5.3)
SODIUM: 143 meq/L (ref 137–147)
TOTAL PROTEIN: 6.6 g/dL (ref 6.0–8.3)
Total Bilirubin: 0.3 mg/dL (ref 0.3–1.2)

## 2014-02-06 LAB — FERRITIN: FERRITIN: 57 ng/mL (ref 22–322)

## 2014-02-06 LAB — CEA: CEA: <0.5 ng/mL (ref 0.0–5.0)

## 2014-02-06 NOTE — Progress Notes (Signed)
[facility] OFFICE PROGRESS NOTE  PCP: Dr. Selinda Michaels   DIAGNOSIS: Stage III Rectal Cancer S/P Low Anterior Resection 1992                         Adjuvant  Chemotherapy 5FU, Leucovorin, and Levamisole in conjunction with external beam pelvic radiation   CURRENT THERAPY:  Surveillance. Prior colonoscopy 5 years ago.  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 The patient has urgency but no stool incontinence as the anastomosis is close to the sphincter. No painful defecation or narrowing. Denies bleeding or urinary complaints. CEA levels have been normal.      MEDICAL HISTORY:  Past Medical History  Diagnosis Date  . Colon cancer 1992    rectal stage 3  . Hypercholesteremia     mild  . Rectal cancer 02/08/2012    Stage III cancer of the rectum with resection earlier in 1992, followed by combination chemotherapy with 5-FU, leucovorin and levamisole in conjunction with radiation therapy to the pelvis by Dr. Nila Nephew, and he has had no evidence of recurrence thus far. His last colonoscopy was by Dr. Benson Norway in November 2010 and for his next one, he would like to see Dr. Hildred Laser.  Dr. Posey Rea  . Hypercholesterolemia 02/08/2012  . Atrial fibrillation 09/26/2012  . Sinus bradycardia 09/26/2012    has Rectal cancer; Hypercholesterolemia; Atrial fibrillation; Sinus bradycardia; and Elevated blood pressure on his problem list.    ALLERGIES:  has No Known Allergies.  MEDICATIONS: has a current medication list which includes the following prescription(s): cyanocobalamin, diphenoxylate-atropine, ibuprofen, krill oil, multivitamin, and propranolol.  FAMILY HISTORY: family history includes Cancer in his daughter and father.  REVIEW OF SYSTEMS:    SINCE YOUR LAST VISIT Been diagnosed or  treated for a new medical /surgical  problem or condition: No (has afib & takes med prn for that) Any Recent Xrays or studies performed: No Any new prescription or OTC medications: No ECOG Perf Status: Fully active, able to carry on all pre-disease performance without restriction Problems sleeping: No Medications taken to help sleep: No How is your appetie: 100% normal Any Supplements: No Any trouble chewing or swallowing: No Any Nausea or Vomiting: No Any Bowel problems: Yes (occassional diarrhea) # Bowel Movements per week: 10+ Any Urinary Issues: No Any Cardiac Problems: No Any Respiratory Issues: No Any Neurological Issues: No Do you live alone: No Feelings hopelessness: No You or your family have any concerns or Health changes: No Pain Assessment Pain Score: 0-No pain  Other than that discussed above is noncontributory.    PHYSICAL EXAMINATION:   weight is 194 lb 12.8 oz (88.361 kg). His oral temperature is 97.7 F (36.5 C). His blood pressure is 145/64 and his pulse is 60. His respiration is 20.    GENERAL:alert, no distress and comfortable EYES: PERRL, EOM intact. Conjunctiva are pink and sclera clear OROPHARYNX:no exudate, no erythema and lips, buccal mucosa, and tongue normal  NECK: supple, thyroid normal size, non-tender, without nodularity. No cervical adenopathy.  CHEST/BREASTS:  Non tender ribcage. No axillary adenopathy.  LUNGS: clear to auscultation and percussion with normal breathing effort HEART: regular rate & rhythm and no murmurs, S3, or S4 ABDOMEN: soft, non-tender, normal bowel sounds;  liver: and spleen are nonpalpable MUSCULOSKELETAL: Spine without localized tenderness .  No peripheral edema or inguinal adenopathy.  SKIN: no jaundice, bruises, or rashes. NEURO: alert & oriented x 3 with fluent speech, no focal motor/sensory deficits,      LABORATORY DATA: Office Visit on 02/06/2014  Component Date Value Ref Range Status  . WBC 02/06/2014 5.1   4.0 - 10.5 K/uL Final  . RBC 02/06/2014 4.44  4.22 - 5.81 MIL/uL Final  . Hemoglobin 02/06/2014 13.9  13.0 - 17.0 g/dL Final  . HCT 02/06/2014 40.1  39.0 - 52.0 % Final  . MCV 02/06/2014 90.3  78.0 - 100.0 fL Final  . MCH 02/06/2014 31.3  26.0 - 34.0 pg Final  . MCHC 02/06/2014 34.7  30.0 - 36.0 g/dL Final  . RDW 02/06/2014 13.0  11.5 - 15.5 % Final  . Platelets 02/06/2014 177  150 - 400 K/uL Final  . Neutrophils Relative % 02/06/2014 57  43 - 77 % Final  . Neutro Abs 02/06/2014 2.9  1.7 - 7.7 K/uL Final  . Lymphocytes Relative 02/06/2014 33  12 - 46 % Final  . Lymphs Abs 02/06/2014 1.7  0.7 - 4.0 K/uL Final  . Monocytes Relative 02/06/2014 8  3 - 12 % Final  . Monocytes Absolute 02/06/2014 0.4  0.1 - 1.0 K/uL Final  . Eosinophils Relative 02/06/2014 2  0 - 5 % Final  . Eosinophils Absolute 02/06/2014 0.1  0.0 - 0.7 K/uL Final  . Basophils Relative 02/06/2014 0  0 - 1 % Final  . Basophils Absolute 02/06/2014 0.0  0.0 - 0.1 K/uL Final  . Sodium 02/06/2014 143  137 - 147 mEq/L Final  . Potassium 02/06/2014 4.3  3.7 - 5.3 mEq/L Final  . Chloride 02/06/2014 106  96 - 112 mEq/L Final  . CO2 02/06/2014 26  19 - 32 mEq/L Final  . Glucose, Bld 02/06/2014 103* 70 - 99 mg/dL Final  . BUN 02/06/2014 18  6 - 23 mg/dL Final  . Creatinine, Ser 02/06/2014 0.96  0.50 - 1.35 mg/dL Final  . Calcium 02/06/2014 9.3  8.4 - 10.5 mg/dL Final  . Total Protein 02/06/2014 6.6  6.0 - 8.3 g/dL Final  . Albumin 02/06/2014 3.7  3.5 - 5.2 g/dL Final  . AST 02/06/2014 14  0 - 37 U/L Final  . ALT 02/06/2014 13  0 - 53 U/L Final  . Alkaline Phosphatase 02/06/2014 91  39 - 117 U/L Final  . Total Bilirubin 02/06/2014 0.3  0.3 - 1.2 mg/dL Final  . GFR calc non Af Amer 02/06/2014 79* >90 mL/min Final  . GFR calc Af Amer 02/06/2014 >90  >90 mL/min Final   Comment: (NOTE)                          The eGFR has been calculated using the CKD EPI equation.                          This calculation has not been validated in  all clinical situations.                          eGFR's persistently <90 mL/min signify possible Chronic Kidney                          Disease.  . Anion gap 02/06/2014 11  5 - 15 Final  . CEA 02/06/2014 <0.5  0.0 -  5.0 ng/mL Final   Performed at [facility]  . Ferritin 02/06/2014 57  22 - 322 ng/mL Final   Performed at [facility]: No results found.  ASSESSMENT:   1. Distant history of Stage III rectal cancer resected in 1992 and clinically without evidence of recurrent or metastatic disease.  2. Decreased rectal capacity S/P low anterior resection and anastomosis. Patient controls frequent stools with lomotil.     RECOMMENDATIONS/PLAN:   Due for surveillance colonoscopy with Dr. Laural Golden. Patient advised accordingly. We remain available to see the patient in 1 year otherwise Mr. Tuohey rectal cancer can be monitored by Dr. Caryl Comes, his PCP.        All questions were answered. The patient knows to call the clinic with any problems, questions or concerns.    Darrall Dears, MD 02/06/2014 9:40 PM

## 2014-02-06 NOTE — Progress Notes (Signed)
LABS FOR FERR,CMP,CBCD,CEA

## 2014-02-06 NOTE — Patient Instructions (Signed)
..  [facility] Discharge Instructions  RECOMMENDATIONS MADE BY THE CONSULTANT AND ANY TEST RESULTS WILL BE SENT TO YOUR REFERRING PHYSICIAN.  EXAM FINDINGS BY THE PHYSICIAN TODAY AND SIGNS OR SYMPTOMS TO REPORT TO CLINIC OR PRIMARY PHYSICIAN: Exam and findings as discussed by Dr. Bubba Hales.   INSTRUCTIONS/FOLLOW-UP: Released from clinic  Thank you for choosing [facility] to provide your oncology and hematology care.  To afford each patient quality time with our providers, please arrive at least 15 minutes before your scheduled appointment time.  With your help, our goal is to use those 15 minutes to complete the necessary work-up to ensure our physicians have the information they need to help with your evaluation and healthcare recommendations.    Effective January 1st, 2014, we ask that you re-schedule your appointment with our physicians should you arrive 10 or more minutes late for your appointment.  We strive to give you quality time with our providers, and arriving late affects you and other patients whose appointments are after yours.    Again, thank you for choosing [facility].  Our hope is that these requests will decrease the amount of time that you wait before being seen by our physicians.       _____________________________________________________________  Should you have questions after your visit to [facility], please contact our office at [phone] between the hours of 8:30 a.m. and 4:30 p.m.  Voicemails left after 4:30 p.m. will not be returned until the following business day.  For prescription refill requests, have your pharmacy contact our office with your prescription refill request.    _______________________________________________________________  We hope that we have given you very good care.  You may receive a patient satisfaction survey in the mail, please complete it and return it as soon as possible.  We  value your feedback!  _______________________________________________________________  Have you asked about our STAR program?  STAR stands for Survivorship Training and Rehabilitation, and this is a nationally recognized cancer care program that focuses on survivorship and rehabilitation.  Cancer and cancer treatments may cause problems, such as, pain, making you feel tired and keeping you from doing the things that you need or want to do. Cancer rehabilitation can help. Our goal is to reduce these troubling effects and help you have the best quality of life possible.  You may receive a survey from a nurse that asks questions about your current state of health.  Based on the survey results, all eligible patients will be referred to the [facility] program for an evaluation so we can better serve you!  A frequently asked questions sheet is available upon request.

## 2014-03-06 ENCOUNTER — Ambulatory Visit: Payer: Medicare Other | Admitting: Physician Assistant

## 2014-03-12 ENCOUNTER — Encounter: Payer: Self-pay | Admitting: Physician Assistant

## 2014-03-12 ENCOUNTER — Ambulatory Visit (INDEPENDENT_AMBULATORY_CARE_PROVIDER_SITE_OTHER): Payer: Medicare Other | Admitting: Physician Assistant

## 2014-03-12 VITALS — BP 151/77 | HR 56 | Ht 71.0 in | Wt 200.0 lb

## 2014-03-12 DIAGNOSIS — R030 Elevated blood-pressure reading, without diagnosis of hypertension: Secondary | ICD-10-CM | POA: Diagnosis not present

## 2014-03-12 DIAGNOSIS — E7800 Pure hypercholesterolemia, unspecified: Secondary | ICD-10-CM

## 2014-03-12 DIAGNOSIS — IMO0001 Reserved for inherently not codable concepts without codable children: Secondary | ICD-10-CM

## 2014-03-12 DIAGNOSIS — I480 Paroxysmal atrial fibrillation: Secondary | ICD-10-CM | POA: Diagnosis not present

## 2014-03-12 MED ORDER — APIXABAN 5 MG PO TABS: 5.0000 mg | ORAL_TABLET | Freq: Two times a day (BID) | ORAL | Status: DC

## 2014-03-12 MED ORDER — METOPROLOL SUCCINATE ER 25 MG PO TB24
25.0000 mg | ORAL_TABLET | Freq: Every day | ORAL | Status: DC
Start: 2014-03-12 — End: 2014-03-31

## 2014-03-12 NOTE — Patient Instructions (Addendum)
Your physician has recommended you make the following change in your medication:  1) Start Eliquis 5 mg twice a day 2) Start Metoprolol ER 25 mg once a day  Your physician wants you to follow-up in: 2 weeks with Richardson Dopp same day as Dr. Caryl Comes in office.  Needs to see [facility] for new Eliquis. Needs BMET/CBC in 6-8 weeks when sees [facility].  Giving prescription for ECK to be performed Monday 03/16/2014 with PCP.

## 2014-03-12 NOTE — Progress Notes (Signed)
Cardiology Office Note   Date:  03/12/2014   ID:  Arthur Obrien, DOB 11/27/1940, MRN [id number]  PCP:  Rory Percy, MD  Cardiologist:  Dr. Virl Axe     History of Present Illness: Arthur Obrien is a 73 y.o. male with a history of PAF, borderline hypertension, HL, colon cancer. Last seen by Dr. Caryl Comes 8/14. Patient's thromboembolic risk factor profile has been low in the past. If he has documented hypertension, he will need anticoagulation therapy.  He returns for FU.  The patient denies chest pain, shortness of breath, syncope, orthopnea, PND or significant pedal edema.  He feels that he has AFib about 1-2 times a month.  This improves with low dose Propranolol.  He has a hx of bradycardia and is unable to take daily beta blockers.  He was in sinus brady today when he was checked in.  However, now he is in AFib.  He is unable to tell that he is in AFib.    Studies:   - Echo (6/14):  EF 55-60%, mild-mod LAE, normal RV function, mild MR, RVSP 37 mmHg, mild pulmonary hypertension   Recent Labs/Images:  02/06/2014: ALT 13; BUN 18; Creatinine 0.96; Hemoglobin 13.9; Potassium 4.3; Sodium 143     Wt Readings from Last 3 Encounters:  03/12/14 200 lb (90.719 kg)  02/06/14 194 lb 12.8 oz (88.361 kg)  02/07/13 196 lb 6.4 oz (89.086 kg)     Past Medical History  Diagnosis Date  . Colon cancer 1992    rectal stage 3  . Hypercholesteremia     mild  . Rectal cancer 02/08/2012    Stage III cancer of the rectum with resection earlier in 1992, followed by combination chemotherapy with 5-FU, leucovorin and levamisole in conjunction with radiation therapy to the pelvis by Dr. Nila Nephew, and he has had no evidence of recurrence thus far. His last colonoscopy was by Dr. Benson Norway in November 2010 and for his next one, he would like to see Dr. Hildred Laser.  Dr. Posey Rea  . Hypercholesterolemia 02/08/2012  . Atrial fibrillation 09/26/2012  . Sinus bradycardia 09/26/2012     Current Outpatient Prescriptions  Medication Sig Dispense Refill  . Cyanocobalamin 2500 MCG TABS Take 2,500 mcg by mouth daily. B12    . diphenoxylate-atropine (LOMOTIL) 2.5-0.025 MG per tablet Take 1-2 tablets PO every 6 hours PRN diarrhea 100 tablet 4  . ibuprofen (ADVIL,MOTRIN) 800 MG tablet Take 800 mg by mouth every 8 (eight) hours as needed.    Javier Docker Oil 300 MG CAPS Take 1 capsule by mouth daily.    . Multiple Vitamin (MULTIVITAMIN) tablet Take 1 tablet by mouth daily.      . propranolol (INDERAL) 10 MG tablet Take one tablet by mouth as needed 30 tablet 3   No current facility-administered medications for this visit.     Allergies:   Review of patient's allergies indicates no known allergies.   Social History:  The patient  reports that he has never smoked. He has never used smokeless tobacco. He reports that he does not drink alcohol or use illicit drugs.   Family History:  The patient's family history includes Cancer in his daughter and father; Hypertension in his mother. There is no history of Heart attack or Stroke.   ROS:  Please see the history of present illness.       All other systems reviewed and negative.    PHYSICAL EXAM: VS:  BP 151/77 mmHg  Pulse  56  Ht 5\' 11"  (1.803 m)  Wt 200 lb (90.719 kg)  BMI 27.91 kg/m2 Well nourished, well developed, in no acute distress HEENT: normal Neck:  no JVD Cardiac:  normal S1, S2; irreg irreg rhythm; no murmur Lungs:   clear to auscultation bilaterally, no wheezing, rhonchi or rales Abd: soft, nontender, no hepatomegaly Ext:  no edema Skin: warm and dry Neuro:  CNs 2-12 intact, no focal abnormalities noted  EKG:  #1 Sinus brady, HR 56;  #2 AFib with RVR, HR ~ 120     BP Readings from Last 3 Encounters:  03/12/14 151/77  02/06/14 145/64  02/07/13 176/92     ASSESSMENT AND PLAN:  1.  Paroxysmal atrial fibrillation:  Patient went into AFib with RVR in the office today. CHADS2-VASc=2 (poss 3 with his borderline  BP).  He should be on anticoagulation Rx.  Bradycardia has limited Rx of RVR in the past.  I suspect he will go back into NSR on his own.    -  Start Toprol-XL 25 mg QD.  Hopefully, he can tolerate this.  He is a Software engineer.  He will check his pulse and reduce it to 12.5 mg QD if his HR is running low.     -  Start Eliquis 5 mg bid.      -  FU ECG with his PCP Monday (he cannot go get one tomorrow) and fax to me.    -  If in NSR at FU, consider Event monitor to assess AFib burden.    -  If remains in AFib by FU ECG with PCP, could consider DCCV as he has been in AFib < 24 hours (started today).     -  Consider repeat echo at FU visit. 2.  Elevated blood pressure:  Keep an eye on BP.  Hopefully, his HR will tolerate the low dose Toprol-XL. 3.  Hypercholesterolemia:  Managed by PCP.  Disposition:   FU with me in 2 weeks.    Signed, Versie Starks, MHS 03/12/2014 11:16 AM    [facility] [place], [place], [place]  [id number] Phone: [phone]; Fax: [phone]

## 2014-03-16 DIAGNOSIS — I480 Paroxysmal atrial fibrillation: Secondary | ICD-10-CM | POA: Diagnosis not present

## 2014-03-24 NOTE — Progress Notes (Signed)
FU ECG 03/16/14:  NSR. Keep FU with Dr. Virl Axe as planned. Richardson Dopp, PA-C   03/24/2014 5:55 PM

## 2014-03-31 ENCOUNTER — Ambulatory Visit (INDEPENDENT_AMBULATORY_CARE_PROVIDER_SITE_OTHER): Payer: Medicare Other | Admitting: Internal Medicine

## 2014-03-31 VITALS — BP 148/76 | HR 62 | Ht 71.0 in | Wt 200.0 lb

## 2014-03-31 DIAGNOSIS — I480 Paroxysmal atrial fibrillation: Secondary | ICD-10-CM

## 2014-03-31 MED ORDER — RIVAROXABAN 20 MG PO TABS
20.0000 mg | ORAL_TABLET | Freq: Every day | ORAL | Status: DC
Start: 2014-03-31 — End: 2014-05-20

## 2014-03-31 MED ORDER — DILTIAZEM HCL ER COATED BEADS 120 MG PO CP24: 120.0000 mg | ORAL_CAPSULE | Freq: Every day | ORAL | Status: DC

## 2014-03-31 NOTE — Progress Notes (Signed)
Patient Care Team: Rory Percy, MD as PCP - General (Family Medicine)   HPI  Arthur Obrien is a 73 y.o. male Seen in followup for atrial fibrillation with a moderately rapid ventricular response in the setting of sinus bradycardia.  For thromboembolic risk factors are notable for borderline hypertension and borderline age 37 a CHADS-2 score of one  And a CHADS-VASc score of 2.   The patient denies chest pain, shortness of breath, nocturnal dyspnea, orthopnea or peripheral edema.  There have been no palpitations, lightheadedness or syncope.    He has paroxysms of atrial fibrillation. This was noted when he saw Richardson Dopp in the clinic 11/12. Sinus rates were 56 A. fib rates were 120. He was started on metoprolol succinate as well as apixaban. Since that time he has felt terrible. He complains primarily of myalgias.  Echocardiogram 614 demonstrated normal left ventricular function but significant left atrial enlargement at 47 mm  Past Medical History  Diagnosis Date  . Colon cancer 1992    rectal stage 3  . Hypercholesteremia     mild  . Rectal cancer 02/08/2012    Stage III cancer of the rectum with resection earlier in 1992, followed by combination chemotherapy with 5-FU, leucovorin and levamisole in conjunction with radiation therapy to the pelvis by Dr. Nila Nephew, and he has had no evidence of recurrence thus far. His last colonoscopy was by Dr. Benson Norway in November 2010 and for his next one, he would like to see Dr. Hildred Laser.  Dr. Posey Rea  . Hypercholesterolemia 02/08/2012  . Atrial fibrillation 09/26/2012  . Sinus bradycardia 09/26/2012    Past Surgical History  Procedure Laterality Date  . Cataract extraction      right(years ago) left 06/2010  . Colon surgery    . Tonsillectomy      Current Outpatient Prescriptions  Medication Sig Dispense Refill  . apixaban (ELIQUIS) 5 MG TABS tablet Take 1 tablet (5 mg total) by mouth 2 (two) times daily. 60 tablet 0   . Cyanocobalamin 2500 MCG TABS Take 2,500 mcg by mouth daily. B12    . diphenoxylate-atropine (LOMOTIL) 2.5-0.025 MG per tablet Take 1-2 tablets PO every 6 hours PRN diarrhea 100 tablet 4  . ibuprofen (ADVIL,MOTRIN) 800 MG tablet Take 800 mg by mouth every 8 (eight) hours as needed.    Javier Docker Oil 300 MG CAPS Take 1 capsule by mouth daily.    . Multiple Vitamin (MULTIVITAMIN) tablet Take 1 tablet by mouth daily.      . propranolol (INDERAL) 10 MG tablet Take one tablet by mouth as needed 30 tablet 3  . metoprolol succinate (TOPROL-XL) 25 MG 24 hr tablet Take 1 tablet (25 mg total) by mouth daily. 30 tablet 0   No current facility-administered medications for this visit.    No Known Allergies  Review of Systems negative except from HPI and PMH  Physical Exam BP 148/76 mmHg  Pulse 62  Ht 5\' 11"  (1.803 m)  Wt 200 lb (90.719 kg)  BMI 27.91 kg/m2 Well developed and nourished in no acute distress HENT normal Neck supple with JVP-flat Clear Regular rate and rhythm, no murmurs or gallops Abd-soft with active BS No Clubbing cyanosis edema Skin-warm and dry A & Oriented  Grossly normal sensory and motor function  ECG demonstrates sinus rhythm at 58 Intervals 17/08/22 Axis XXXIV  Assessment and  Plan   Sinus bradycardia  Atrial fibrillation-paroxysmal  Hypertension  Myalgias.  Blood pressure is borderline elevated. We'll  fluid medication issues listed below we'll address it.  Temporally it is the patient's impression that his myalgias are related to his drugs. I do not see any significant association of myalgias with apixaban; however, we will go ahead and stop it. We will also stop his metoprolol succinate. In the event that his symptoms do not abate, I've asked that he follow-up with his PCP to consider alternative diagnoses like polymyalgia rheumatica.  If his symptoms do abate that we will implicate one of the drugs. It is more important that we protect him against  thromboembolism with a CHADS-VASc score of 3 so we will begin him on Rivaroxaban . I should note that his renal function was normal 2 weeks ago. Creatinine clearance was 79.  I will also give a prescription for low-dose diltiazem to take in conjunction. Again, I don't know that it's terribly significant put him on rate control as the reason to treat his atrial fibrillation is primarily related to symptoms apart from the issue of thromboembolic risk reduction.

## 2014-03-31 NOTE — Patient Instructions (Addendum)
Your physician has recommended you make the following change in your medication:  1) STOP Eliquis 2) STOP Metoprolol 3) START Diltiazem 120 mg daily 4) START Xarelto 20 mg daily  Your physician recommends that you schedule a follow-up appointment in: 4 weeks with Richardson Dopp, PA.   Your physician wants you to follow-up in: 1 year with Dr. Caryl Comes.  You will receive a reminder letter in the mail two months in advance. If you don't receive a letter, please call our office to schedule the follow-up appointment.

## 2014-04-08 ENCOUNTER — Encounter: Payer: Self-pay | Admitting: Physician Assistant

## 2014-04-22 ENCOUNTER — Other Ambulatory Visit: Payer: Medicare Other

## 2014-05-13 ENCOUNTER — Encounter: Payer: Self-pay | Admitting: Internal Medicine

## 2014-05-20 ENCOUNTER — Telehealth: Payer: Self-pay | Admitting: *Deleted

## 2014-05-20 ENCOUNTER — Ambulatory Visit (INDEPENDENT_AMBULATORY_CARE_PROVIDER_SITE_OTHER): Payer: Medicare Other | Admitting: Physician Assistant

## 2014-05-20 ENCOUNTER — Encounter: Payer: Self-pay | Admitting: Physician Assistant

## 2014-05-20 VITALS — BP 143/72 | HR 60 | Ht 71.0 in | Wt 202.0 lb

## 2014-05-20 DIAGNOSIS — M791 Myalgia, unspecified site: Secondary | ICD-10-CM

## 2014-05-20 DIAGNOSIS — I10 Essential (primary) hypertension: Secondary | ICD-10-CM

## 2014-05-20 DIAGNOSIS — I480 Paroxysmal atrial fibrillation: Secondary | ICD-10-CM | POA: Diagnosis not present

## 2014-05-20 LAB — CBC WITH DIFFERENTIAL/PLATELET
BASOS PCT: 0.4 % (ref 0.0–3.0)
Basophils Absolute: 0 10*3/uL (ref 0.0–0.1)
EOS PCT: 2.6 % (ref 0.0–5.0)
Eosinophils Absolute: 0.1 10*3/uL (ref 0.0–0.7)
HCT: 37.7 % — ABNORMAL LOW (ref 39.0–52.0)
Hemoglobin: 13.1 g/dL (ref 13.0–17.0)
Lymphocytes Relative: 25.8 % (ref 12.0–46.0)
Lymphs Abs: 1.3 10*3/uL (ref 0.7–4.0)
MCHC: 34.8 g/dL (ref 30.0–36.0)
MCV: 89 fl (ref 78.0–100.0)
MONO ABS: 0.4 10*3/uL (ref 0.1–1.0)
Monocytes Relative: 7 % (ref 3.0–12.0)
Neutro Abs: 3.2 10*3/uL (ref 1.4–7.7)
Neutrophils Relative %: 64.2 % (ref 43.0–77.0)
Platelets: 183 10*3/uL (ref 150.0–400.0)
RBC: 4.23 Mil/uL (ref 4.22–5.81)
RDW: 13.4 % (ref 11.5–15.5)
WBC: 5.1 10*3/uL (ref 4.0–10.5)

## 2014-05-20 LAB — BASIC METABOLIC PANEL
BUN: 12 mg/dL (ref 6–23)
CALCIUM: 9.1 mg/dL (ref 8.4–10.5)
CO2: 31 mEq/L (ref 19–32)
CREATININE: 0.96 mg/dL (ref 0.40–1.50)
Chloride: 108 mEq/L (ref 96–112)
GFR: 81.01 mL/min (ref >60.00)
GLUCOSE: 101 mg/dL — AB (ref 70–99)
POTASSIUM: 3.9 meq/L (ref 3.5–5.1)
Sodium: 141 mEq/L (ref 135–145)

## 2014-05-20 MED ORDER — RIVAROXABAN 20 MG PO TABS: 20.0000 mg | ORAL_TABLET | Freq: Every day | ORAL | Status: DC

## 2014-05-20 NOTE — Telephone Encounter (Signed)
Pt notified of lab results with verbal understanding  

## 2014-05-20 NOTE — Patient Instructions (Addendum)
Good to see you today.  Continue on Diltiazem every day.  Continue on Xarelto every day.  We will send in a prescription to your pharmacy.  Labs today:  BMET and CBC.  Follow up with Dr. Virl Axe in 04/2015 as planned.  Keep an eye on your blood pressure.  Call if it is 150/90 or higher.

## 2014-05-20 NOTE — Progress Notes (Signed)
Cardiology Office Note   Date:  05/20/2014   ID:  Arthur Obrien, Arthur Obrien 03-11-1941, MRN [id number]  Patient Care Team: Rory Percy, MD as PCP - General (Family Medicine) Deboraha Sprang, MD as Consulting Physician (Clinical Cardiac Electrophysiology)   Chief Complaint  Patient presents with  . Atrial Fibrillation  . Hypertension     History of Present Illness: Arthur Obrien is a 74 y.o. male who presents for FU on the above problems.  He has a history of PAF, borderline hypertension, HL, colon cancer.  I saw him in follow-up 03/12/14. He went into atrial fibrillation while in the office. I placed him on low-dose Toprol and Apixaban for anticoagulation.  He then saw Dr. Caryl Comes in follow-up 03/31/14. He complained of feeling poorly.  He reported problems with myalgias. His Apixaban was stopped. He was placed on Xarelto. His Toprol was stopped and he was placed on Cardizem CD. He returns for follow-up.  He is doing well.  His myalgias stopped as soon as he stopped the Apixaban.  He is tolerating the Diltiazem and Xarelto.  He denies chest pain, shortness of breath, syncope, orthopnea, PND or significant pedal edema. He has not taken an Propranolol since last seen.  He denies any increase in Palpitations.    Studies Reviewed Today:    - Echo (6/14): EF 55-60%, mild-mod LAE, normal RV function, mild MR, RVSP 37 mmHg, mild pulmonary hypertension      Past Medical History  Diagnosis Date  . Colon cancer 1992    rectal stage 3  . HTN (hypertension)   . Rectal cancer 02/08/2012    Stage III cancer of the rectum with resection earlier in 1992, followed by combination chemotherapy with 5-FU, leucovorin and levamisole in conjunction with radiation therapy to the pelvis by Dr. Nila Nephew, and he has had no evidence of recurrence thus far. His last colonoscopy was by Dr. Benson Norway in November 2010 and for his next one, he would like to see Dr. Hildred Laser.  Dr. Posey Rea  .  Hypercholesterolemia 02/08/2012  . PAF (paroxysmal atrial fibrillation) 09/26/2012  . Sinus bradycardia 09/26/2012  . Hx of echocardiogram     a.  Echo (6/14): EF 55-60%, mild-mod LAE, normal RV function, mild MR, RVSP 37 mmHg, mild pulmonary hypertension      Past Surgical History  Procedure Laterality Date  . Cataract extraction      right(years ago) left 06/2010  . Colon surgery    . Tonsillectomy       Current Outpatient Prescriptions  Medication Sig Dispense Refill  . Cyanocobalamin 2500 MCG TABS Take 2,500 mcg by mouth daily. B12    . diltiazem (CARDIZEM CD) 120 MG 24 hr capsule Take 1 capsule (120 mg total) by mouth daily. 90 capsule 3  . diphenoxylate-atropine (LOMOTIL) 2.5-0.025 MG per tablet Take 1-2 tablets PO every 6 hours PRN diarrhea 100 tablet 4  . Krill Oil 300 MG CAPS Take 1 capsule by mouth daily.    . Multiple Vitamin (MULTIVITAMIN) tablet Take 1 tablet by mouth daily.      . rivaroxaban (XARELTO) 20 MG TABS tablet Take 1 tablet (20 mg total) by mouth daily with supper. 30 tablet 0   No current facility-administered medications for this visit.    Allergies:   Review of patient's allergies indicates no known allergies.    Social History:  The patient  reports that he has never smoked. He has never used smokeless tobacco. He reports that  he does not drink alcohol or use illicit drugs.   Family History:  The patient's family history includes Cancer in his daughter and father; Hypertension in his mother. There is no history of Heart attack or Stroke.    ROS:  Please see the history of present illness.   Otherwise, review of systems are positive for chronic diarrhea, mild muscle pain.   All other systems are reviewed and negative.    PHYSICAL EXAM: VS:  BP 143/72 mmHg  Pulse 60  Ht 5\' 11"  (1.803 m)  Wt 202 lb (91.627 kg)  BMI 28.19 kg/m2   GEN: Well nourished, well developed, in no acute distress HEENT: normal Neck: no JVD or masses Cardiac:  RRR; no  murmurs, rubs, or gallops,no edema  Respiratory:  clear to auscultation bilaterally, no wheezing, rhonchi or rales. GI: soft, nontender  MS: no deformity or atrophy Skin: warm and dry  Neuro:  Strength and sensation are intact Psych: Normal affect   Wt Readings from Last 3 Encounters:  05/20/14 202 lb (91.627 kg)  03/31/14 200 lb (90.719 kg)  03/12/14 200 lb (90.719 kg)     EKG:  EKG is ordered today.  It demonstrates:   NSR, HR 60, no chagnes   Recent Labs: 02/06/2014: ALT 13; BUN 18; Creatinine 0.96; Hemoglobin 13.9; Platelets 177; Potassium 4.3; Sodium 143    Lipid Panel    Component Value Date/Time   CHOL 196 02/08/2012 0956   TRIG 229* 02/08/2012 0956   HDL 43 02/08/2012 0956   CHOLHDL 4.6 02/08/2012 0956   VLDL 46* 02/08/2012 0956   LDLCALC 107* 02/08/2012 0956     ASSESSMENT AND PLAN:  1.  Paroxysmal Atrial Fibrillation:  Maintaining NSR.  CHADS2-VASc=3 (age 87, HTN).  Continue Xarelto.  Continue Diltiazem.      - BMET, CBC today. 2.  Hypertension:  Borderline control.  He is a retired Software engineer.  He will keep an eye on his BP and let us know if it is elevated. 3.  Myalgias:  Improved off of Apixaban.   Current medicines are reviewed at length with the patient today.  The patient does not have concerns regarding medicines.  The following changes have been made:  no change  Labs/ tests ordered today include:  Orders Placed This Encounter  Procedures  . Basic metabolic panel  . CBC with Differential  . EKG 12-Lead     Disposition:   FU with Dr. Virl Axe  in 1 year.   Signed, Versie Starks, MHS 05/20/2014 8:41 AM    [facility] [address], [place], [place]  [address] Phone: [phone]; Fax: [phone]

## 2014-10-26 ENCOUNTER — Other Ambulatory Visit: Payer: Self-pay

## 2015-02-03 DIAGNOSIS — Z23 Encounter for immunization: Secondary | ICD-10-CM | POA: Diagnosis not present

## 2015-03-08 ENCOUNTER — Other Ambulatory Visit: Payer: Self-pay | Admitting: Internal Medicine

## 2015-05-26 ENCOUNTER — Other Ambulatory Visit: Payer: Self-pay | Admitting: Physician Assistant

## 2015-06-02 ENCOUNTER — Ambulatory Visit (INDEPENDENT_AMBULATORY_CARE_PROVIDER_SITE_OTHER): Payer: Medicare Other | Admitting: Internal Medicine

## 2015-06-02 ENCOUNTER — Encounter: Payer: Self-pay | Admitting: Internal Medicine

## 2015-06-02 VITALS — BP 148/68 | HR 63 | Ht 71.0 in | Wt 195.8 lb

## 2015-06-02 DIAGNOSIS — I4891 Unspecified atrial fibrillation: Secondary | ICD-10-CM | POA: Diagnosis not present

## 2015-06-02 DIAGNOSIS — I480 Paroxysmal atrial fibrillation: Secondary | ICD-10-CM | POA: Diagnosis not present

## 2015-06-02 LAB — CBC WITH DIFFERENTIAL/PLATELET
BASOS PCT: 0 % (ref 0–1)
Basophils Absolute: 0 10*3/uL (ref 0.0–0.1)
Eosinophils Absolute: 0.1 10*3/uL (ref 0.0–0.7)
Eosinophils Relative: 2 % (ref 0–5)
HCT: 38.7 % — ABNORMAL LOW (ref 39.0–52.0)
Hemoglobin: 12.6 g/dL — ABNORMAL LOW (ref 13.0–17.0)
Lymphocytes Relative: 24 % (ref 12–46)
Lymphs Abs: 1.3 10*3/uL (ref 0.7–4.0)
MCH: 28.1 pg (ref 26.0–34.0)
MCHC: 32.6 g/dL (ref 30.0–36.0)
MCV: 86.4 fL (ref 78.0–100.0)
MPV: 10.2 fL (ref 8.6–12.4)
Monocytes Absolute: 0.4 10*3/uL (ref 0.1–1.0)
Monocytes Relative: 7 % (ref 3–12)
NEUTROS PCT: 67 % (ref 43–77)
Neutro Abs: 3.8 10*3/uL (ref 1.7–7.7)
PLATELETS: 222 10*3/uL (ref 150–400)
RBC: 4.48 MIL/uL (ref 4.22–5.81)
RDW: 14.4 % (ref 11.5–15.5)
WBC: 5.6 10*3/uL (ref 4.0–10.5)

## 2015-06-02 LAB — BASIC METABOLIC PANEL
BUN: 13 mg/dL (ref 7–25)
CALCIUM: 9 mg/dL (ref 8.6–10.3)
CO2: 27 mmol/L (ref 20–31)
Chloride: 106 mmol/L (ref 98–110)
Creat: 0.88 mg/dL (ref 0.70–1.18)
Glucose, Bld: 94 mg/dL (ref 65–99)
Potassium: 3.8 mmol/L (ref 3.5–5.3)
Sodium: 141 mmol/L (ref 135–146)

## 2015-06-02 MED ORDER — DILTIAZEM HCL ER COATED BEADS 120 MG PO CP24: 120.0000 mg | ORAL_CAPSULE | Freq: Every day | ORAL | Status: DC

## 2015-06-02 MED ORDER — RIVAROXABAN 20 MG PO TABS: 20.0000 mg | ORAL_TABLET | Freq: Every day | ORAL | Status: DC

## 2015-06-02 MED ORDER — DILTIAZEM HCL 30 MG PO TABS: ORAL_TABLET | ORAL | Status: DC

## 2015-06-02 MED ORDER — LISINOPRIL 5 MG PO TABS: 5.0000 mg | ORAL_TABLET | Freq: Every day | ORAL | Status: DC

## 2015-06-02 NOTE — Patient Instructions (Signed)
Medication Instructions: 1) Start lisinopril 5 mg one tablet by mouth once daily 2) Start diltiazem 30 mg one tablet by mouth every 4 hours as needed for heart palpitations/ racing  Labwork: - Your physician recommends that you have lab work today: BMET/ CBC  Procedures/Testing: - none  Follow-Up: - Your physician wants you to follow-up in: 1 year with Dr. Caryl Comes. You will receive a reminder letter in the mail two months in advance. If you don't receive a letter, please call our office to schedule the follow-up appointment.  Any Additional Special Instructions Will Be Listed Below (If Applicable).     If you need a refill on your cardiac medications before your next appointment, please call your pharmacy.

## 2015-06-02 NOTE — Progress Notes (Signed)
Patient Care Team: Rory Percy, MD as PCP - General (Family Medicine) Deboraha Sprang, MD as Consulting Physician (Clinical Cardiac Electrophysiology)   HPI  Arthur Obrien is a 75 y.o. male Seen in followup for atrial fibrillation with a moderately rapid ventricular response in the setting of sinus bradycardia.  For thromboembolic risk factors are notable for borderline hypertension and borderline age 89 a CHADS-2 score of one  And a CHADS-VASc score of 2. On Rivaroxaban   The patient denies chest pain, shortness of breath, nocturnal dyspnea, orthopnea or peripheral edema.  There have been some palpitations particularly at night.  He does not have daytime somnolence. He does snore. His wife has had no complaints of this.   Echocardiogram 6/14 demonstrated normal left ventricular function but significant left atrial enlargement at 47 mm  Past Medical History  Diagnosis Date  . Colon cancer ([place]) 1992    rectal stage 3  . HTN (hypertension)   . Rectal cancer ([place]) 02/08/2012    Stage III cancer of the rectum with resection earlier in 1992, followed by combination chemotherapy with 5-FU, leucovorin and levamisole in conjunction with radiation therapy to the pelvis by Dr. Nila Nephew, and he has had no evidence of recurrence thus far. His last colonoscopy was by Dr. Benson Norway in November 2010 and for his next one, he would like to see Dr. Hildred Laser.  Dr. Posey Rea  . Hypercholesterolemia 02/08/2012  . PAF (paroxysmal atrial fibrillation) ([place]) 09/26/2012  . Sinus bradycardia 09/26/2012  . Hx of echocardiogram     a.  Echo (6/14): EF 55-60%, mild-mod LAE, normal RV function, mild MR, RVSP 37 mmHg, mild pulmonary hypertension      Past Surgical History  Procedure Laterality Date  . Cataract extraction      right(years ago) left 06/2010  . Colon surgery    . Tonsillectomy      Current Outpatient Prescriptions  Medication Sig Dispense Refill  . Cyanocobalamin 2500 MCG TABS  Take 5,000 mcg by mouth daily. B12    . diltiazem (CARDIZEM CD) 120 MG 24 hr capsule Take 1 capsule (120 mg total) by mouth daily. 90 capsule 3  . diphenoxylate-atropine (LOMOTIL) 2.5-0.025 MG per tablet Take 1-2 tablets PO every 6 hours PRN diarrhea 100 tablet 4  . Krill Oil 300 MG CAPS Take 1 capsule by mouth daily.    . Multiple Vitamin (MULTIVITAMIN) tablet Take 1 tablet by mouth daily.      . rivaroxaban (XARELTO) 20 MG TABS tablet Take 1 tablet (20 mg total) by mouth daily with supper. 30 tablet 11  . sildenafil (REVATIO) 20 MG tablet Take 20 mg by mouth as needed.    . diltiazem (CARDIZEM) 30 MG tablet Take one tablet by mouth every 4 hours as needed for heart palpitations/ racing 30 tablet 3  . lisinopril (PRINIVIL,ZESTRIL) 5 MG tablet Take 1 tablet (5 mg total) by mouth daily. 90 tablet 3   No current facility-administered medications for this visit.    No Known Allergies  Review of Systems negative except from HPI and PMH  Physical Exam BP 148/68 mmHg  Pulse 63  Ht 5\' 11"  (1.803 m)  Wt 195 lb 12.8 oz (88.814 kg)  BMI 27.32 kg/m2 Well developed and nourished in no acute distress HENT normal Neck supple with JVP-flat Clear Regular rate and rhythm, no murmurs or gallops Abd-soft with active BS No Clubbing cyanosis edema Skin-warm and dry A & Oriented  Grossly normal sensory and motor function  ECG demonstrates sinus rhythm at 63 Intervals 16/09/40 Axis XXXIII Otherwise normal   Assessment and  Plan  Sinus bradycardia  Atrial fibrillation-paroxysmal  Hypertension  Myalgias.    Blood pressure remains elevated. We discussed the sprain data. We'll begin him on lisinopril 5 mg. We have reviewed potential side effects. We will check his metabolic profile today. He is reminded that he needs a metabolic profile in 2 weeks time with his primary care physician. We will also check a CBC on his anticoagulant. He is not having any bleeding issues.  We will use diltiazem  to take as needed for his paroxysms of atrial fibrillation  Myalgias are much attenuated on the Rivaroxaban as opposed to the apixaban

## 2016-02-04 ENCOUNTER — Other Ambulatory Visit: Payer: Self-pay | Admitting: Internal Medicine

## 2016-02-04 DIAGNOSIS — Z23 Encounter for immunization: Secondary | ICD-10-CM | POA: Diagnosis not present

## 2016-06-17 ENCOUNTER — Other Ambulatory Visit: Payer: Self-pay | Admitting: Internal Medicine

## 2016-06-19 ENCOUNTER — Encounter: Payer: Self-pay | Admitting: Internal Medicine

## 2016-06-19 NOTE — Telephone Encounter (Signed)
Pt last saw Dr Caryl Comes on 06/02/15, has appt scheduled 1 yr f/u on 06/29/16.  Last labs on 06/02/15 Creat 0.88, placed note on appt to draw f/u CBC and BMP at [place].  Pts age is 39, weight 88.8kg, CrCl 88.3, pt is on Appropriate dosage of Xarelto 20mg  QD, will refill x 1 and await new labs results from [facility] on 06/29/16 to verify dosage still appropriate.

## 2016-06-29 ENCOUNTER — Ambulatory Visit (INDEPENDENT_AMBULATORY_CARE_PROVIDER_SITE_OTHER): Payer: Medicare Other | Admitting: Internal Medicine

## 2016-06-29 ENCOUNTER — Encounter: Payer: Self-pay | Admitting: Internal Medicine

## 2016-06-29 VITALS — BP 130/60 | HR 64 | Ht 71.0 in | Wt 189.2 lb

## 2016-06-29 DIAGNOSIS — R001 Bradycardia, unspecified: Secondary | ICD-10-CM

## 2016-06-29 DIAGNOSIS — Z79899 Other long term (current) drug therapy: Secondary | ICD-10-CM | POA: Diagnosis not present

## 2016-06-29 DIAGNOSIS — I480 Paroxysmal atrial fibrillation: Secondary | ICD-10-CM

## 2016-06-29 NOTE — Progress Notes (Signed)
Patient Care Team: Rory Percy, MD as PCP - General (Family Medicine) Deboraha Sprang, MD as Consulting Physician (Clinical Cardiac Electrophysiology)   HPI  Arthur Obrien is a 76 y.o. male Seen in followup for atrial fibrillation with a moderately rapid ventricular response in the setting of sinus bradycardia.  For thromboembolic risk factors are notable for borderline hypertension and borderline age 89 a CHADS-2 score of one  And a CHADS-VASc score of 2. On Rivaroxaban   The patient denies chest pain, shortness of breath, nocturnal dyspnea, orthopnea or peripheral edema.  There have been some palpitations particularly at night.  He does not have daytime somnolence. He does snore. His wife has had no complaints of this.   Echocardiogram 6/14 demonstrated normal left ventricular function but significant left atrial enlargement at 47 mm  Past Medical History:  Diagnosis Date  . Colon cancer ([place]) 1992   rectal stage 3  . HTN (hypertension)   . Hx of echocardiogram    a.  Echo (6/14): EF 55-60%, mild-mod LAE, normal RV function, mild MR, RVSP 37 mmHg, mild pulmonary hypertension    . Hypercholesterolemia 02/08/2012  . PAF (paroxysmal atrial fibrillation) ([place]) 09/26/2012  . Rectal cancer ([place]) 02/08/2012   Stage III cancer of the rectum with resection earlier in 1992, followed by combination chemotherapy with 5-FU, leucovorin and levamisole in conjunction with radiation therapy to the pelvis by Dr. Nila Nephew, and he has had no evidence of recurrence thus far. His last colonoscopy was by Dr. Benson Norway in November 2010 and for his next one, he would like to see Dr. Hildred Laser.  Dr. Posey Rea  . Sinus bradycardia 09/26/2012    Past Surgical History:  Procedure Laterality Date  . CATARACT EXTRACTION     right(years ago) left 06/2010  . COLON SURGERY    . TONSILLECTOMY      Current Outpatient Prescriptions  Medication Sig Dispense Refill  . Cyanocobalamin 2500 MCG TABS  Take 5,000 mcg by mouth daily. B12    . diltiazem (CARDIZEM) 30 MG tablet Take one tablet by mouth every 4 hours as needed for heart palpitations/ racing 30 tablet 3  . diltiazem (TIAZAC) 120 MG 24 hr capsule TAKE ONE CAPSULE BY MOUTH DAILY. 90 capsule 0  . diphenoxylate-atropine (LOMOTIL) 2.5-0.025 MG per tablet Take 1-2 tablets PO every 6 hours PRN diarrhea 100 tablet 4  . lisinopril (PRINIVIL,ZESTRIL) 5 MG tablet TAKE ONE TABLET BY MOUTH DAILY. 90 tablet 0  . Multiple Vitamin (MULTIVITAMIN) tablet Take 1 tablet by mouth daily.      . rivaroxaban (XARELTO) 20 MG TABS tablet Take 1 tablet (20 mg total) by mouth daily with supper. 30 tablet 1  . sildenafil (REVATIO) 20 MG tablet Take 20 mg by mouth as needed.     No current facility-administered medications for this visit.     No Known Allergies  Review of Systems negative except from HPI and PMH  Physical Exam BP 130/60   Pulse 64   Ht 5\' 11"  (1.803 m)   Wt 189 lb 3.2 oz (85.8 kg)   SpO2 99%   BMI 26.39 kg/m  Well developed and nourished in no acute distress HENT normal Neck supple with JVP-flat Clear Regular rate and rhythm, no murmurs or gallops Abd-soft with active BS No Clubbing cyanosis edema Skin-warm and dry A & Oriented  Grossly normal sensory and motor function   ECG personally reviewed Sinus 55 17/08/40    Assessment and  Plan  Sinus  bradycardia  Atrial fibrillation-paroxysmal  Hypertension     Blood pressure is better   No significant intercurrent atrial fibrillation or flutter  On Anticoagulation;  No bleeding issues   Will check BMET and CBC today   We will use diltiazem to take as needed for his paroxysms of atrial fibrillation

## 2016-06-29 NOTE — Patient Instructions (Addendum)
Medication Instructions: Your physician recommends that you continue on your current medications as directed. Please refer to the Current Medication list given to you today.   Labwork: Your physician recommends that you have lab work today: CBC and BMET   Procedures/Testing: None Ordered  Follow-Up: Your physician wants you to follow-up in 1 YEAR with Dr. Caryl Comes. You will receive a reminder letter in the mail two months in advance. If you don't receive a letter, please call our office to schedule the follow-up appointment.   Any Additional Special Instructions Will Be Listed Below (If Applicable).     If you need a refill on your cardiac medications before your next appointment, please call your pharmacy.

## 2016-06-30 LAB — BASIC METABOLIC PANEL
BUN / CREAT RATIO: 11 (ref 10–24)
BUN: 11 mg/dL (ref 8–27)
CHLORIDE: 105 mmol/L (ref 96–106)
CO2: 24 mmol/L (ref 18–29)
CREATININE: 0.99 mg/dL (ref 0.76–1.27)
Calcium: 9.1 mg/dL (ref 8.6–10.2)
GFR calc Af Amer: 85 mL/min/{1.73_m2} (ref >59)
GFR calc non Af Amer: 73 mL/min/{1.73_m2} (ref >59)
GLUCOSE: 76 mg/dL (ref 65–99)
Potassium: 4.3 mmol/L (ref 3.5–5.2)
Sodium: 145 mmol/L — ABNORMAL HIGH (ref 134–144)

## 2016-06-30 LAB — CBC WITH DIFFERENTIAL/PLATELET
BASOS ABS: 0 10*3/uL (ref 0.0–0.2)
Basos: 0 %
EOS (ABSOLUTE): 0.1 10*3/uL (ref 0.0–0.4)
Eos: 2 %
HEMOGLOBIN: 12.2 g/dL — AB (ref 13.0–17.7)
Hematocrit: 36.7 % — ABNORMAL LOW (ref 37.5–51.0)
IMMATURE GRANS (ABS): 0 10*3/uL (ref 0.0–0.1)
IMMATURE GRANULOCYTES: 0 %
LYMPHS: 28 %
Lymphocytes Absolute: 1.9 10*3/uL (ref 0.7–3.1)
MCH: 28.6 pg (ref 26.6–33.0)
MCHC: 33.2 g/dL (ref 31.5–35.7)
MCV: 86 fL (ref 79–97)
MONOCYTES: 8 %
Monocytes Absolute: 0.5 10*3/uL (ref 0.1–0.9)
NEUTROS PCT: 62 %
Neutrophils Absolute: 4.2 10*3/uL (ref 1.4–7.0)
PLATELETS: 268 10*3/uL (ref 150–379)
RBC: 4.27 x10E6/uL (ref 4.14–5.80)
RDW: 14.8 % (ref 12.3–15.4)
WBC: 6.8 10*3/uL (ref 3.4–10.8)

## 2016-07-10 ENCOUNTER — Telehealth: Payer: Self-pay | Admitting: Internal Medicine

## 2016-07-10 NOTE — Telephone Encounter (Signed)
New Message    Pt is returning christines call regarding lab results

## 2016-07-10 NOTE — Telephone Encounter (Signed)
Pt aware of results.  Will f/u with PCP.

## 2016-07-14 ENCOUNTER — Other Ambulatory Visit: Payer: Self-pay | Admitting: Internal Medicine

## 2016-07-14 NOTE — Telephone Encounter (Signed)
Request for Xarelto 20mg  received; wt-85.8kg on 06/29/16, Crea-0.99 on 06/29/16, last seen by Dr Caryl Comes on 06/29/16, age-76 yrs old, CreCl-75.70ml/min

## 2016-07-15 ENCOUNTER — Other Ambulatory Visit: Payer: Self-pay | Admitting: Internal Medicine

## 2016-10-09 DIAGNOSIS — Z85038 Personal history of other malignant neoplasm of large intestine: Secondary | ICD-10-CM | POA: Diagnosis not present

## 2016-10-09 DIAGNOSIS — Z9049 Acquired absence of other specified parts of digestive tract: Secondary | ICD-10-CM | POA: Diagnosis not present

## 2016-10-09 DIAGNOSIS — R1033 Periumbilical pain: Secondary | ICD-10-CM | POA: Diagnosis not present

## 2016-10-09 DIAGNOSIS — R1013 Epigastric pain: Secondary | ICD-10-CM | POA: Diagnosis not present

## 2016-10-09 DIAGNOSIS — K529 Noninfective gastroenteritis and colitis, unspecified: Secondary | ICD-10-CM | POA: Diagnosis not present

## 2016-10-09 DIAGNOSIS — K639 Disease of intestine, unspecified: Secondary | ICD-10-CM | POA: Diagnosis not present

## 2016-10-09 DIAGNOSIS — Z7901 Long term (current) use of anticoagulants: Secondary | ICD-10-CM | POA: Diagnosis not present

## 2016-10-09 DIAGNOSIS — Z79899 Other long term (current) drug therapy: Secondary | ICD-10-CM | POA: Diagnosis not present

## 2016-10-09 DIAGNOSIS — K6389 Other specified diseases of intestine: Secondary | ICD-10-CM | POA: Diagnosis not present

## 2016-11-17 ENCOUNTER — Other Ambulatory Visit: Payer: Self-pay

## 2016-11-27 DIAGNOSIS — Z6825 Body mass index (BMI) 25.0-25.9, adult: Secondary | ICD-10-CM | POA: Diagnosis not present

## 2016-11-27 DIAGNOSIS — R141 Gas pain: Secondary | ICD-10-CM | POA: Diagnosis not present

## 2016-11-27 DIAGNOSIS — K30 Functional dyspepsia: Secondary | ICD-10-CM | POA: Diagnosis not present

## 2016-11-30 ENCOUNTER — Encounter (INDEPENDENT_AMBULATORY_CARE_PROVIDER_SITE_OTHER): Payer: Self-pay | Admitting: Internal Medicine

## 2016-12-01 ENCOUNTER — Encounter (INDEPENDENT_AMBULATORY_CARE_PROVIDER_SITE_OTHER): Payer: Self-pay | Admitting: Internal Medicine

## 2016-12-14 ENCOUNTER — Ambulatory Visit (INDEPENDENT_AMBULATORY_CARE_PROVIDER_SITE_OTHER): Payer: Medicare Other | Admitting: Internal Medicine

## 2016-12-14 ENCOUNTER — Other Ambulatory Visit (INDEPENDENT_AMBULATORY_CARE_PROVIDER_SITE_OTHER): Payer: Self-pay | Admitting: Internal Medicine

## 2016-12-14 ENCOUNTER — Encounter (INDEPENDENT_AMBULATORY_CARE_PROVIDER_SITE_OTHER): Payer: Self-pay | Admitting: *Deleted

## 2016-12-14 ENCOUNTER — Telehealth (INDEPENDENT_AMBULATORY_CARE_PROVIDER_SITE_OTHER): Payer: Self-pay | Admitting: *Deleted

## 2016-12-14 ENCOUNTER — Encounter (INDEPENDENT_AMBULATORY_CARE_PROVIDER_SITE_OTHER): Payer: Self-pay | Admitting: Internal Medicine

## 2016-12-14 DIAGNOSIS — R1013 Epigastric pain: Secondary | ICD-10-CM

## 2016-12-14 DIAGNOSIS — R1010 Upper abdominal pain, unspecified: Secondary | ICD-10-CM | POA: Insufficient documentation

## 2016-12-14 DIAGNOSIS — C20 Malignant neoplasm of rectum: Secondary | ICD-10-CM

## 2016-12-14 HISTORY — DX: Epigastric pain: R10.13

## 2016-12-14 MED ORDER — PANTOPRAZOLE SODIUM 40 MG PO TBEC: 40.0000 mg | DELAYED_RELEASE_TABLET | Freq: Two times a day (BID) | ORAL | 5 refills | Status: DC

## 2016-12-14 MED ORDER — PEG 3350-KCL-NA BICARB-NACL 420 G PO SOLR: 4000.0000 mL | Freq: Once | ORAL | 0 refills | Status: AC

## 2016-12-14 NOTE — Telephone Encounter (Signed)
Patient needs trilyte 

## 2016-12-14 NOTE — Progress Notes (Signed)
Subjective:    Patient ID: Arthur Obrien, male    DOB: May 03, 1940, 76 y.o.   MRN: [id number]  HPI Referred by Arthur Obrien for abdominal pain. He was seen in the ED 11/10/2016 for epigastric pain. Underwent a CT which was basically normal. Was told to take Pepto Bismol. Felt better the next day. He says he has a burning in his epastric region. The Protonix is helping but not completely.  The pain in his epigastric region comes and goes. Denies having a virus. No fever. Symptoms worse after eating.  He is avoiding spicy foods at this point.  Has a BM several times a day. Stools are always loose which is normal for him. His appetite is good. No weight loss.  Last colonoscopy in 2010 by Arthur Obrien (personal hx of  Rectal cancer.  Normal. NO colonic or rectal polyps.   Hx of atrial fib, hypertension, high cholesterol. Maintained on Xarelto. Has been on Xarelto x 3 years Review of Systems Past Medical History:  Diagnosis Date  . Colon cancer ([place]) 1992   rectal stage 3  . HTN (hypertension)   . Hx of echocardiogram    a.  Echo (6/14): EF 55-60%, mild-mod LAE, normal RV function, mild MR, RVSP 37 mmHg, mild pulmonary hypertension    . Hypercholesterolemia 02/08/2012  . PAF (paroxysmal atrial fibrillation) ([place]) 09/26/2012  . Rectal cancer ([place]) 02/08/2012   Stage III cancer of the rectum with resection earlier in 1992, followed by combination chemotherapy with 5-FU, leucovorin and levamisole in conjunction with radiation therapy to the pelvis by Arthur Obrien, and he has had no evidence of recurrence thus far. His last colonoscopy was by Arthur Obrien in November 2010 and for his next one, he would like to see Arthur Obrien.  Arthur Obrien  . Sinus bradycardia 09/26/2012    Past Surgical History:  Procedure Laterality Date  . CATARACT EXTRACTION     right(years ago) left 06/2010  . COLON SURGERY    . TONSILLECTOMY      No Known Allergies  Current Outpatient Prescriptions on  File Prior to Visit  Medication Sig Dispense Refill  . Cyanocobalamin 2500 MCG TABS Take 5,000 mcg by mouth daily. B12    . diltiazem (CARDIZEM) 30 MG tablet TAKE ONE TABLET BY MOUTH EVERY 4 HOURS AS NEEDED FOR HEART PALPITATIONS/ RACING. 90 tablet 1  . diltiazem (TIAZAC) 120 MG 24 hr capsule TAKE ONE CAPSULE BY MOUTH DAILY. 90 capsule 3  . diphenoxylate-atropine (LOMOTIL) 2.5-0.025 MG per tablet Take 1-2 tablets PO every 6 hours PRN diarrhea 100 tablet 4  . lisinopril (PRINIVIL,ZESTRIL) 5 MG tablet TAKE ONE TABLET BY MOUTH DAILY. 90 tablet 3  . Multiple Vitamin (MULTIVITAMIN) tablet Take 1 tablet by mouth daily.      [name] 20 MG TABS tablet TAKE ONE TABLET BY MOUTH DAILY WITH SUPPER. 30 tablet 5   No current facility-administered medications on file prior to visit.         Objective:   Physical Exam Blood pressure 140/62, pulse 60, temperature 98.2 F (36.8 C), height 5\' 11"  (1.803 m), weight 179 lb (81.2 kg).  Alert and oriented. Skin warm and dry. Oral mucosa is moist.   . Sclera anicteric, conjunctivae is pink. Thyroid not enlarged. No cervical lymphadenopathy. Lungs clear. Heart regular rate and rhythm.  Abdomen is soft. Bowel sounds are positive. No hepatomegaly. No abdominal masses felt. No tenderness.  No edema to lower extremities. Patient is alert  and oriented.       Assessment & Plan:  GERD. EGD to rule out PUD. Will increase Protonix to BID.  Samples of Dexilant x 4 boxes given to patient to try.  Hx of rectal cancer. Last colonoscopy 2010. Colonoscopy.

## 2016-12-14 NOTE — Patient Instructions (Signed)
EGD/Colonoscopy. Take the Protonix twice a day.

## 2016-12-18 ENCOUNTER — Telehealth: Payer: Self-pay | Admitting: Internal Medicine

## 2016-12-18 ENCOUNTER — Encounter (INDEPENDENT_AMBULATORY_CARE_PROVIDER_SITE_OTHER): Payer: Self-pay | Admitting: *Deleted

## 2016-12-18 NOTE — Telephone Encounter (Signed)
Left message with Arthur Obrien to clarify if this is a surgical clearance question or just a question about holding xarelto.

## 2016-12-18 NOTE — Telephone Encounter (Signed)
° °  [facility] Pre-operative Risk Assessment    Request for surgical clearance:  1. What type of surgery is being performed? Colonoscopy and Endoscopy   When is this surgery scheduled?01/10/17  Are there any medications that need to be held prior to surgery and how long?Xarelto to be stopped 2 days prior   2. Name of physician performing surgery? Dr. Salley Slaughter  3. What is your office phone and fax number? Fax# [phone]   Romana Juniper 12/18/2016, 12:04 PM  _________________________________________________________________   (provider comments below)

## 2016-12-18 NOTE — Telephone Encounter (Signed)
No clearance is needed for procedure per Lelon Frohlich at Dr. Olevia Perches office.

## 2016-12-18 NOTE — Telephone Encounter (Signed)
Pt takes Xarelto for afib with CHADS2 of 2 (age and HTN), normal renal function. Ok to hold Xarelto for 2 days as requested. Clearance faxed to [phone].

## 2016-12-20 ENCOUNTER — Ambulatory Visit (INDEPENDENT_AMBULATORY_CARE_PROVIDER_SITE_OTHER): Payer: PRIVATE HEALTH INSURANCE | Admitting: Internal Medicine

## 2017-01-02 ENCOUNTER — Other Ambulatory Visit: Payer: Self-pay | Admitting: Internal Medicine

## 2017-01-10 ENCOUNTER — Encounter (HOSPITAL_COMMUNITY)
Admission: RE | Disposition: A | Discharge status: Home Health | Payer: Self-pay | Source: Ambulatory Visit | Attending: Internal Medicine

## 2017-01-10 ENCOUNTER — Ambulatory Visit (HOSPITAL_COMMUNITY)
Admission: RE | Admit: 2017-01-10 | Discharge: 2017-01-10 | Disposition: A | Discharge status: Home Health | Payer: Medicare Other | Source: Ambulatory Visit | Attending: Internal Medicine | Admitting: Internal Medicine

## 2017-01-10 ENCOUNTER — Encounter (HOSPITAL_COMMUNITY): Payer: Self-pay | Admitting: *Deleted

## 2017-01-10 DIAGNOSIS — Z980 Intestinal bypass and anastomosis status: Secondary | ICD-10-CM | POA: Diagnosis not present

## 2017-01-10 DIAGNOSIS — Z9221 Personal history of antineoplastic chemotherapy: Secondary | ICD-10-CM | POA: Diagnosis not present

## 2017-01-10 DIAGNOSIS — Z08 Encounter for follow-up examination after completed treatment for malignant neoplasm: Secondary | ICD-10-CM | POA: Diagnosis not present

## 2017-01-10 DIAGNOSIS — K644 Residual hemorrhoidal skin tags: Secondary | ICD-10-CM | POA: Insufficient documentation

## 2017-01-10 DIAGNOSIS — Z923 Personal history of irradiation: Secondary | ICD-10-CM | POA: Insufficient documentation

## 2017-01-10 DIAGNOSIS — D123 Benign neoplasm of transverse colon: Secondary | ICD-10-CM | POA: Insufficient documentation

## 2017-01-10 DIAGNOSIS — D122 Benign neoplasm of ascending colon: Secondary | ICD-10-CM | POA: Diagnosis not present

## 2017-01-10 DIAGNOSIS — R1010 Upper abdominal pain, unspecified: Secondary | ICD-10-CM | POA: Insufficient documentation

## 2017-01-10 DIAGNOSIS — K449 Diaphragmatic hernia without obstruction or gangrene: Secondary | ICD-10-CM | POA: Insufficient documentation

## 2017-01-10 DIAGNOSIS — I10 Essential (primary) hypertension: Secondary | ICD-10-CM | POA: Insufficient documentation

## 2017-01-10 DIAGNOSIS — I480 Paroxysmal atrial fibrillation: Secondary | ICD-10-CM | POA: Insufficient documentation

## 2017-01-10 DIAGNOSIS — R1013 Epigastric pain: Secondary | ICD-10-CM | POA: Insufficient documentation

## 2017-01-10 DIAGNOSIS — Z79899 Other long term (current) drug therapy: Secondary | ICD-10-CM | POA: Diagnosis not present

## 2017-01-10 DIAGNOSIS — Z7901 Long term (current) use of anticoagulants: Secondary | ICD-10-CM | POA: Diagnosis not present

## 2017-01-10 DIAGNOSIS — Z85038 Personal history of other malignant neoplasm of large intestine: Secondary | ICD-10-CM | POA: Diagnosis not present

## 2017-01-10 DIAGNOSIS — C20 Malignant neoplasm of rectum: Secondary | ICD-10-CM | POA: Insufficient documentation

## 2017-01-10 DIAGNOSIS — R634 Abnormal weight loss: Secondary | ICD-10-CM | POA: Diagnosis not present

## 2017-01-10 DIAGNOSIS — K317 Polyp of stomach and duodenum: Secondary | ICD-10-CM | POA: Diagnosis not present

## 2017-01-10 DIAGNOSIS — Z85048 Personal history of other malignant neoplasm of rectum, rectosigmoid junction, and anus: Secondary | ICD-10-CM | POA: Insufficient documentation

## 2017-01-10 DIAGNOSIS — Z6824 Body mass index (BMI) 24.0-24.9, adult: Secondary | ICD-10-CM | POA: Insufficient documentation

## 2017-01-10 HISTORY — PX: POLYPECTOMY: SHX5525

## 2017-01-10 HISTORY — PX: COLONOSCOPY: SHX5424

## 2017-01-10 HISTORY — PX: BIOPSY: SHX5522

## 2017-01-10 HISTORY — PX: ESOPHAGOGASTRODUODENOSCOPY: SHX5428

## 2017-01-10 SURGERY — EGD (ESOPHAGOGASTRODUODENOSCOPY)
Anesthesia: Moderate Sedation

## 2017-01-10 MED ORDER — MIDAZOLAM HCL 5 MG/5ML IJ SOLN
INTRAMUSCULAR | Status: AC
  Filled 2017-01-10: qty 5

## 2017-01-10 MED ORDER — MEPERIDINE HCL 50 MG/ML IJ SOLN
INTRAMUSCULAR | Status: DC | PRN
  Administered 2017-01-10 (×2): 25 mg via INTRAVENOUS

## 2017-01-10 MED ORDER — SPOT INK MARKER SYRINGE KIT
PACK | SUBMUCOSAL | Status: DC | PRN
  Administered 2017-01-10: 5.5 mL via SUBMUCOSAL
  Administered 2017-01-10: 4 mL via SUBMUCOSAL

## 2017-01-10 MED ORDER — MIDAZOLAM HCL 5 MG/5ML IJ SOLN
INTRAMUSCULAR | Status: AC
  Filled 2017-01-10: qty 10

## 2017-01-10 MED ORDER — SPOT INK MARKER SYRINGE KIT
PACK | SUBMUCOSAL | Status: AC
  Filled 2017-01-10: qty 5

## 2017-01-10 MED ORDER — MIDAZOLAM HCL 5 MG/5ML IJ SOLN
INTRAMUSCULAR | Status: DC | PRN
  Administered 2017-01-10 (×6): 2 mg via INTRAVENOUS

## 2017-01-10 MED ORDER — SODIUM CHLORIDE 0.9 % IV SOLN
INTRAVENOUS | Status: DC
  Administered 2017-01-10: 1000 mL via INTRAVENOUS

## 2017-01-10 MED ORDER — LIDOCAINE VISCOUS 2 % MT SOLN
OROMUCOSAL | Status: AC
  Filled 2017-01-10: qty 15

## 2017-01-10 MED ORDER — MEPERIDINE HCL 50 MG/ML IJ SOLN
INTRAMUSCULAR | Status: AC
  Filled 2017-01-10: qty 1

## 2017-01-10 MED ORDER — SODIUM CHLORIDE 0.9% FLUSH
INTRAVENOUS | Status: AC
  Filled 2017-01-10: qty 20

## 2017-01-10 MED ORDER — LIDOCAINE VISCOUS 2 % MT SOLN
OROMUCOSAL | Status: DC | PRN
  Administered 2017-01-10: 1 via OROMUCOSAL

## 2017-01-10 NOTE — Discharge Instructions (Signed)
Resume Xarelto on 01/15/2017. Resume other medications and diet as before. No driving for 24 hours. Physician will call with biopsy results and further recommendations.   Colonoscopy, Adult, Care After This sheet gives you information about how to care for yourself after your procedure. Your health care provider may also give you more specific instructions. If you have problems or questions, contact your health care provider. What can I expect after the procedure? After the procedure, it is common to have:  A small amount of blood in your stool for 24 hours after the procedure.  Some gas.  Mild abdominal cramping or bloating.  Follow these instructions at home: General instructions   For the first 24 hours after the procedure: ? Do not drive or use machinery. ? Do not sign important documents. ? Do not drink alcohol. ? Do your regular daily activities at a slower pace than normal. ? Eat soft, easy-to-digest foods. ? Rest often.  Take over-the-counter or prescription medicines only as told by your health care provider.  It is up to you to get the results of your procedure. Ask your health care provider, or the department performing the procedure, when your results will be ready. Relieving cramping and bloating  Try walking around when you have cramps or feel bloated.  Apply heat to your abdomen as told by your health care provider. Use a heat source that your health care provider recommends, such as a moist heat pack or a heating pad. ? Place a towel between your skin and the heat source. ? Leave the heat on for 20-30 minutes. ? Remove the heat if your skin turns bright red. This is especially important if you are unable to feel pain, heat, or cold. You may have a greater risk of getting burned. Eating and drinking  Drink enough fluid to keep your urine clear or pale yellow.  Resume your normal diet as instructed by your health care provider. Avoid heavy or fried foods that  are hard to digest.  Avoid drinking alcohol for as long as instructed by your health care provider. Contact a health care provider if:  You have blood in your stool 2-3 days after the procedure. Get help right away if:  You have more than a small spotting of blood in your stool.  You pass large blood clots in your stool.  Your abdomen is swollen.  You have nausea or vomiting.  You have a fever.  You have increasing abdominal pain that is not relieved with medicine.    Colon Polyps Polyps are tissue growths inside the body. Polyps can grow in many places, including the large intestine (colon). A polyp may be a round bump or a mushroom-shaped growth. You could have one polyp or several. Most colon polyps are noncancerous (benign). However, some colon polyps can become cancerous over time. What are the causes? The exact cause of colon polyps is not known. What increases the risk? This condition is more likely to develop in people who:  Have a family history of colon cancer or colon polyps.  Are older than 90 or older than 45 if they are African American.  Have inflammatory bowel disease, such as ulcerative colitis or Crohn disease.  Are overweight.  Smoke cigarettes.  Do not get enough exercise.  Drink too much alcohol.  Eat a diet that is: ? High in fat and red meat. ? Low in fiber.  Had childhood cancer that was treated with abdominal radiation.  What are the signs  or symptoms? Most polyps do not cause symptoms. If you have symptoms, they may include:  Blood coming from your rectum when having a bowel movement.  Blood in your stool.The stool may look dark red or black.  A change in bowel habits, such as constipation or diarrhea.  How is this diagnosed? This condition is diagnosed with a colonoscopy. This is a procedure that uses a lighted, flexible scope to look at the inside of your colon. How is this treated? Treatment for this condition involves  removing any polyps that are found. Those polyps will then be tested for cancer. If cancer is found, your health care provider will talk to you about options for colon cancer treatment. Follow these instructions at home: Diet  Eat plenty of fiber, such as fruits, vegetables, and whole grains.  Eat foods that are high in calcium and vitamin D, such as milk, cheese, yogurt, eggs, liver, fish, and broccoli.  Limit foods high in fat, red meats, and processed meats, such as hot dogs, sausage, bacon, and lunch meats.  Maintain a healthy weight, or lose weight if recommended by your health care provider. General instructions  Do not smoke cigarettes.  Do not drink alcohol excessively.  Keep all follow-up visits as told by your health care provider. This is important. This includes keeping regularly scheduled colonoscopies. Talk to your health care provider about when you need a colonoscopy.  Exercise every day or as told by your health care provider. Contact a health care provider if:  You have new or worsening bleeding during a bowel movement.  You have new or increased blood in your stool.  You have a change in bowel habits.  You unexpectedly lose weight. This information is not intended to replace advice given to you by your health care provider. Make sure you discuss any questions you have with your health care provider.  Gastrointestinal Endoscopy, Care After Refer to this sheet in the next few weeks. These instructions provide you with information about caring for yourself after your procedure. Your health care provider may also give you more specific instructions. Your treatment has been planned according to current medical practices, but problems sometimes occur. Call your health care provider if you have any problems or questions after your procedure. What can I expect after the procedure? After your procedure, it is common to feel:  Bloated.  Soreness in your  throat.  Sleepy.  Follow these instructions at home:  Do not drive for 24 hours if you received a if you received a medicine to help you relax (sedative).  Avoid drinking warm beverages and alcohol for the first 24 hours after the procedure.  Take over-the-counter and prescription medicines only as told by your health care provider.  Drink enough fluids to keep your urine clear or pale yellow.  If you feel bloated, try going for a walk. Walking may help the feeling go away.  If your throat is sore, try gargling with salt water. Get help right away if:  You have severe nausea or vomiting.  You have severe abdominal pain, abdominal cramps that last longer than 6 hours, or abdominal swelling.  You have severe shoulder or back pain.  You have trouble swallowing.  You have shortness of breath, your breathing is shallow, or you breathing is faster than normal.  You have a fever.  Your heart is beating very fast.  You vomit blood or material that looks like coffee grounds.  You have bloody, black, or tarry stools.  This information is not intended to replace advice given to you by your health care provider. Make sure you discuss any questions you have with your health care provider. ° °

## 2017-01-10 NOTE — Op Note (Signed)
[facility] Patient Name: Arthur Obrien Procedure Date: 01/10/2017 2:58 PM MRN: [id number] Date of Birth: [birth year] Attending MD: Hildred Laser , MD CSN: [id number] Age: 76 Admit Type: Outpatient Procedure:                Upper GI endoscopy Indications:              Epigastric abdominal pain Providers:                Hildred Laser, MD, Lurline Del, RN, Bonnetta Barry,                            Technician Referring MD:             Manon Hilding, MD Medicines:                Lidocaine spray, Meperidine 50 mg IV, Midazolam 10                            mg IV Complications:            No immediate complications. Estimated Blood Loss:     Estimated blood loss was minimal. Procedure:                Pre-Anesthesia Assessment:                           - Prior to the procedure, a History and Physical                            was performed, and patient medications and                            allergies were reviewed. The patient's tolerance of                            previous anesthesia was also reviewed. The risks                            and benefits of the procedure and the sedation                            options and risks were discussed with the patient.                            All questions were answered, and informed consent                            was obtained. Prior Anticoagulants: The patient                            last took Xarelto (rivaroxaban) 2 days prior to the                            procedure. ASA Grade Assessment: II - A patient  with mild systemic disease. After reviewing the                            risks and benefits, the patient was deemed in                            satisfactory condition to undergo the procedure.                           After obtaining informed consent, the endoscope was                            passed under direct vision. Throughout the                            procedure, the patient's  blood pressure, pulse, and                            oxygen saturations were monitored continuously. The                            EG29-[id number] ([id number]) scope was introduced through the                            mouth, and advanced to the second part of duodenum.                            The upper GI endoscopy was accomplished without                            difficulty. The patient tolerated the procedure                            well. Scope In: 3:14:20 PM Scope Out: 3:20:50 PM Total Procedure Duration: 0 hours 6 minutes 30 seconds  Findings:      The examined esophagus was normal.      The Z-line was regular and was found 38 cm from the incisors.      A 2 cm hiatal hernia was present.      A few 3 to 7 mm semi-sessile polyps were found in the gastric fundus and       in the gastric body. Biopsies were taken with a cold forceps for       histology. The pathology specimen was placed into Bottle Number 1.      The exam of the stomach was otherwise normal.      The duodenal bulb and second portion of the duodenum were normal. Impression:               - Normal esophagus.                           - Z-line regular, 38 cm from the incisors.                           - 2 cm hiatal hernia.                           -  A few gastric polyps. Biopsied.                           - Normal duodenal bulb and second portion of the                            duodenum. Moderate Sedation:      Moderate (conscious) sedation was administered by the endoscopy nurse       and supervised by the endoscopist. The following parameters were       monitored: oxygen saturation, heart rate, blood pressure, CO2       capnography and response to care. Total physician intraservice time was       14 minutes. Recommendation:           - Patient has a contact number available for                            emergencies. The signs and symptoms of potential                            delayed complications were  discussed with the                            patient. Return to normal activities tomorrow.                            Written discharge instructions were provided to the                            patient.                           - Resume previous diet today.                           - Continue present medications.                           - Resume Xarelto (rivaroxaban) at prior dose in 5                            days.                           - Await pathology results. Procedure Code(s):        --- Professional ---                           [phone], Esophagogastroduodenoscopy, flexible,                            transoral; with biopsy, single or multiple                           99152, Moderate sedation services provided by the  same physician or other qualified health care                            professional performing the diagnostic or                            therapeutic service that the sedation supports,                            requiring the presence of an independent trained                            observer to assist in the monitoring of the                            patient's level of consciousness and physiological                            status; initial 15 minutes of intraservice time,                            patient age 63 years or older Diagnosis Code(s):        --- Professional ---                           K44.9, Diaphragmatic hernia without obstruction or                            gangrene                           K31.7, Polyp of stomach and duodenum                           R10.13, Epigastric pain CPT copyright 2016 American Medical Association. All rights reserved. The codes documented in this report are preliminary and upon coder review may  be revised to meet current compliance requirements. Hildred Laser, MD Hildred Laser, MD 01/10/2017 5:24:28 PM This report has been signed electronically. Number of Addenda:  0

## 2017-01-10 NOTE — H&P (Signed)
Arthur Obrien is an 76 y.o. male.   Chief Complaint: Patient is here for EGD and colonoscopy. HPI: patient is 76 year old Caucasian male who presents with three-month history of epigastric pain made worse with meals. He's had few episodes of nausea and vomiting. No history of hematemesis or melena. He was seen in emergency room at [facility] in [facility] and underwent CT and no abnormality fouo account for symptoms. Since he has been on PPI he feels better. He has lost 18-20 pounds. He does not take OTC NSAIDs. He is Xarelto which is on hold for the procedure. He also has history of rectal carcinoma and is undergoing surveillance colonoscopy.He denies rectal bleeding. Family history is positive for non-GI malignancies including multiple myeloma and father who died at age 87 and one uncle had lung cancer.  Past Medical History:  Diagnosis Date  .  12/14/2016  . Colon cancer (Arthur Obrien) 1992   rectal stage 3  . HTN (hypertension)   . Hx of echocardiogram    a.  Echo (6/14): EF 55-60%, mild-mod LAE, normal RV function, mild MR, RVSP 37 mmHg, mild pulmonary hypertension    . Hypercholesterolemia 02/08/2012  . PAF (paroxysmal atrial fibrillation) ([place]) 09/26/2012  . Rectal cancer (Arthur Obrien) 02/08/2012   Stage III cancer of the rectum with resection earlier in 1992, followed by combination chemotherapy with 5-FU, leucovorin and levamisole in conjunction with radiation therapy to the pelvis by Dr. Nila Nephew, and he has had no evidence of recurrence thus far. His last colonoscopy was by Dr. Benson Norway in November 2010 and for his next one, he would like to see Dr. Hildred Laser.  Dr. Posey Rea  . Sinus bradycardia 09/26/2012    Past Surgical History:  Procedure Laterality Date  . CATARACT EXTRACTION     right(years ago) left 06/2010  . COLON SURGERY    . TONSILLECTOMY      Family History  Problem Relation Age of Onset  . Hypertension Mother   . Cancer Father        multiple myeloma  . Cancer  Daughter        metastatic breast  . Heart attack Neg Hx   . Stroke Neg Hx    Social History:  reports that he has never smoked. He has never used smokeless tobacco. He reports that he does not drink alcohol or use drugs.  Allergies: No Known Allergies  Medications Prior to Admission  Medication Sig Dispense Refill  . acetaminophen (TYLENOL) 500 MG tablet Take 1,000 mg by mouth every 6 (six) hours as needed for mild pain or headache.    . Cyanocobalamin 2500 MCG TABS Take 5,000 mcg by mouth daily. B12    . diltiazem (TIAZAC) 120 MG 24 hr capsule TAKE ONE CAPSULE BY MOUTH DAILY. 90 capsule 3  . diphenoxylate-atropine (LOMOTIL) 2.5-0.025 MG per tablet Take 1-2 tablets PO every 6 hours PRN diarrhea 100 tablet 4  . GAVILYTE-N WITH FLAVOR PACK 420 g solution MIX AND DRINK 4,059mS BY MOUTH ONCE AS DIRECTED.  0  . Hypromellose (ARTIFICIAL TEARS OP) Place 1 drop into both eyes every 4 (four) hours as needed (dry eyes).    .Marland Kitchenlisinopril (PRINIVIL,ZESTRIL) 5 MG tablet TAKE ONE TABLET BY MOUTH DAILY. 90 tablet 3  . Multiple Vitamin (MULTIVITAMIN) tablet Take 1 tablet by mouth daily.      . pantoprazole (PROTONIX) 40 MG tablet Take 1 tablet (40 mg total) by mouth 2 (two) times daily before a meal. 60 tablet 5  .  simethicone (MYLICON) 493 MG chewable tablet Chew 125 mg by mouth every 6 (six) hours as needed for flatulence.    . diltiazem (CARDIZEM) 30 MG tablet TAKE ONE TABLET BY MOUTH EVERY 4 HOURS AS NEEDED FOR HEART PALPITATIONS/ RACING. 90 tablet 1  . XARELTO 20 MG TABS tablet TAKE ONE TABLET BY MOUTH DAILY WITH SUPPER. 30 tablet 6    No results found for this or any previous visit (from the past 48 hour(s)). No results found.  ROS  Blood pressure (!) 147/57, pulse 66, temperature 98.1 F (36.7 C), temperature source Oral, resp. rate 18, height '5\' 11"'  (1.803 m), weight 175 lb (79.4 kg), SpO2 98 %. Physical Exam  Constitutional: He appears well-developed and well-nourished.  HENT:   Mouth/Throat: Oropharynx is clear and moist.  Eyes: Conjunctivae are normal. No scleral icterus.  Neck: No thyromegaly present.  Cardiovascular: Normal rate, regular rhythm and normal heart sounds.   No murmur heard. Respiratory: Effort normal and breath sounds normal.  GI:  Abdomen is symmetrical with low midline scar. It is soft and non tender without organomegaly or masses.  Lymphadenopathy:    He has no cervical adenopathy.     Assessment/Plan Epigastric pain and weight loss. History of rectal carcinoma. Diagnostic EGD and surveillance colonoscopy.  Hildred Laser, MD 01/10/2017, 2:58 PM

## 2017-01-10 NOTE — Op Note (Addendum)
[facility] Patient Name: Arthur Obrien Procedure Date: 01/10/2017 3:24 PM MRN: [id number] Date of Birth: 09/13/1940 Attending MD: Hildred Laser , MD CSN: [id number] Age: 76 Admit Type: Outpatient Procedure:                Colonoscopy Indications:              High risk colon cancer surveillance: Personal                            history of colon cancer Providers:                Hildred Laser, MD, Lurline Del, RN, Bonnetta Barry,                            Technician Referring MD:              Medicines:                Midazolam 2 mg IV Complications:            No immediate complications. Estimated Blood Loss:     Estimated blood loss was minimal. Procedure:                Pre-Anesthesia Assessment:                           - Prior to the procedure, a History and Physical                            was performed, and patient medications and                            allergies were reviewed. The patient's tolerance of                            previous anesthesia was also reviewed. The risks                            and benefits of the procedure and the sedation                            options and risks were discussed with the patient.                            All questions were answered, and informed consent                            was obtained. Prior Anticoagulants: The patient                            last took Xarelto (rivaroxaban) 2 days prior to the                            procedure. ASA Grade Assessment: II - A patient  with mild systemic disease. After reviewing the                            risks and benefits, the patient was deemed in                            satisfactory condition to undergo the procedure.                           After obtaining informed consent, the colonoscope                            was passed under direct vision. Throughout the                            procedure, the patient's blood pressure,  pulse, and                            oxygen saturations were monitored continuously. The                            EC-[id number] ([id number]) scope was introduced through                            the anus and advanced to the the cecum, identified                            by appendiceal orifice and ileocecal valve. The                            colonoscopy was performed without difficulty. The                            patient tolerated the procedure well. The quality                            of the bowel preparation was adequate to identify                            polyps 6 mm and larger in size. The ileocecal                            valve, appendiceal orifice, and rectum were                            photographed. Scope In: 3:27:10 PM Scope Out: 4:59:13 PM Total Procedure Duration: 1 hour 32 minutes 3 seconds  Findings:      The perianal and digital rectal examinations were normal.      Four sessile polyps were found in the transverse colon and ascending       colon. The polyps were small in size. These were biopsied with a cold       forceps for histology. The pathology specimen was placed into Bottle  Number 2.      A 8 mm polyp was found in the ascending colon. The polyp was       semi-pedunculated. The polyp was removed with a hot snare. Resection and       retrieval were complete. The pathology specimen was placed into Bottle       Number 3. To prevent bleeding after the polypectomy, two hemostatic       clips were successfully placed (MR conditional). There was no bleeding       at the end of the procedure.      A 10 to 20 mm polyp was found in the proximal transverse colon. The       polyp was carpet-like. The polyp was removed with a saline       injection-lift technique using a hot snare. Polyp resection was       incomplete. The resected tissue was retrieved. Coagulation for       destruction of remaining portion of lesion using argon plasma was        successful.      Single large multi-lobulated polyps were found in the splenic flexure.       The polyps were greater than 50 mm in size. These polyps were removed       with a piecemeal technique using a hot snare. Polyp resection was       incomplete. The resected tissue was retrieved. Coagulation for tissue       destruction using argon plasma was successful.      There was evidence of a prior end-to-end colo-colonic anastomosis in the       mid rectum. This was patent.      external hemorrhoids were found on retroflexion. Impression:               - Four small polyps in the transverse colon and in                            the ascending colon. Biopsied.                           - One 8 mm polyp in the ascending colon, removed                            with a hot snare. Resected and retrieved. Clips (MR                            conditional) were placed.                           - One 10 to 20 mm polyp in the proximal transverse                            colon, removed using injection-lift and a hot                            snare. Incomplete resection. Resected tissue                            retrieved. Treated with argon plasma coagulation                            (  APC).                           - Multiple greater than 50 mm polyps at the splenic                            flexure, removed piecemeal using a hot snare.                            Incomplete resection. Resected tissue retrieved.                            Treated with argon plasma coagulation (APC).                           - Patent end-to-end colo-colonic anastomosis.                           - Small external hemorrhoids.                           Few small polyps were not removed. Moderate Sedation:      Moderate (conscious) sedation was administered by the endoscopy nurse       and supervised by the endoscopist. The following parameters were       monitored: oxygen saturation, heart rate, blood  pressure, CO2       capnography and response to care. Total physician intraservice time was       99 minutes. Recommendation:           - Patient has a contact number available for                            emergencies. The signs and symptoms of potential                            delayed complications were discussed with the                            patient. Return to normal activities tomorrow.                            Written discharge instructions were provided to the                            patient.                           - Resume previous diet today.                           - Resume Xarelto (rivaroxaban) at prior dose in 5                            days.                           -  Await pathology results.                           - Repeat colonoscopy date to be determined after                            pending pathology results are reviewed. Procedure Code(s):        --- Professional ---                           [phone], Colonoscopy, flexible; with ablation of                            tumor(s), polyp(s), or other lesion(s) (includes                            pre- and post-dilation and guide wire passage, when                            performed)                           45385, 59, Colonoscopy, flexible; with removal of                            tumor(s), polyp(s), or other lesion(s) by snare                            technique                           45380, 59, Colonoscopy, flexible; with biopsy,                            single or multiple                           45381, Colonoscopy, flexible; with directed                            submucosal injection(s), any substance                           99152, Moderate sedation services provided by the                            same physician or other qualified health care                            professional performing the diagnostic or                            therapeutic service that the sedation  supports,                            requiring the presence of an independent trained  observer to assist in the monitoring of the                            patient's level of consciousness and physiological                            status; initial 15 minutes of intraservice time,                            patient age 3 years or older                           [phone], Moderate sedation services; each additional                            15 minutes intraservice time                           99153, Moderate sedation services; each additional                            15 minutes intraservice time                           99153, Moderate sedation services; each additional                            15 minutes intraservice time                           99153, Moderate sedation services; each additional                            15 minutes intraservice time                           99153, Moderate sedation services; each additional                            15 minutes intraservice time Diagnosis Code(s):        --- Professional ---                           D12.2, Benign neoplasm of ascending colon                           D12.3, Benign neoplasm of transverse colon (hepatic                            flexure or splenic flexure)                           Z85.038, Personal history of other malignant                            neoplasm of large intestine  Z98.0, Intestinal bypass and anastomosis status CPT copyright 2016 American Medical Association. All rights reserved. The codes documented in this report are preliminary and upon coder review may  be revised to meet current compliance requirements. Hildred Laser, MD Hildred Laser, MD 01/10/2017 5:38:30 PM This report has been signed electronically. Number of Addenda: 1 Addendum Number: 1   Addendum Date: 01/15/2017 1:02:48 PM      There is one large polyp at splenic flexure nd did  not multi polyps as       noted under impression. Hildred Laser, MD Hildred Laser, MD 01/15/2017 1:05:43 PM This report has been signed electronically.

## 2017-01-18 ENCOUNTER — Encounter (HOSPITAL_COMMUNITY): Payer: Self-pay | Admitting: Internal Medicine

## 2017-01-31 DIAGNOSIS — Z23 Encounter for immunization: Secondary | ICD-10-CM | POA: Diagnosis not present

## 2017-02-01 DIAGNOSIS — D126 Benign neoplasm of colon, unspecified: Secondary | ICD-10-CM | POA: Diagnosis not present

## 2017-02-01 DIAGNOSIS — I480 Paroxysmal atrial fibrillation: Secondary | ICD-10-CM | POA: Diagnosis not present

## 2017-02-01 DIAGNOSIS — Z85038 Personal history of other malignant neoplasm of large intestine: Secondary | ICD-10-CM | POA: Diagnosis not present

## 2017-02-01 DIAGNOSIS — R140 Abdominal distension (gaseous): Secondary | ICD-10-CM | POA: Diagnosis not present

## 2017-02-01 DIAGNOSIS — R109 Unspecified abdominal pain: Secondary | ICD-10-CM | POA: Diagnosis not present

## 2017-02-05 ENCOUNTER — Other Ambulatory Visit: Payer: Self-pay | Admitting: General Surgery

## 2017-02-05 ENCOUNTER — Telehealth: Payer: Self-pay

## 2017-02-05 DIAGNOSIS — R140 Abdominal distension (gaseous): Principal | ICD-10-CM

## 2017-02-05 DIAGNOSIS — R109 Unspecified abdominal pain: Secondary | ICD-10-CM

## 2017-02-05 NOTE — Telephone Encounter (Signed)
Patient with diagnosis of atrial fibrillaton on Xarelto for anticoagulation.    Procedure: colonic resection Date of procedure: not set  CHADS2-VASc score of  3 (HTN, AGE x 2)  CrCl 75.8 Platelet count 268  Per office protocol, patient can hold Xarelto for 3 days prior to procedure.    Patient should restart Xarelto on the evening of procedure or day after, at discretion of procedure MD

## 2017-02-05 NOTE — Telephone Encounter (Signed)
   [facility] Pre-operative Risk Assessment    Request for surgical clearance:  1. What type of surgery is being performed? COLONIC RESECTION  2. When is this surgery scheduled? The patient does not have a surgery date scheduled yet because he will require written clearance in order to get a surgery date. Please advise if the patient is cleared for surgery and needs to hold blood thinner. Please call to advise if this patient will require an office visit or further medical work-up before clearance can be given.   3. Are there any medications that need to be held prior to surgery and how long? Please advise if the patient is cleared for surgery and needs to hold blood thinner.  4. Practice name and name of physician performing surgery? [facility]. Dr. Redmond Pulling  5. What is your office phone and fax number? P: [phone]. F: [phone].  6. Anesthesia type (None, local, MAC, general) ? GENERAL   Stephannie Peters 02/05/2017, 3:48 PM  _________________________________________________________________   (provider comments below)

## 2017-02-06 ENCOUNTER — Other Ambulatory Visit: Payer: Self-pay | Admitting: General Surgery

## 2017-02-06 DIAGNOSIS — R140 Abdominal distension (gaseous): Principal | ICD-10-CM

## 2017-02-06 DIAGNOSIS — R109 Unspecified abdominal pain: Secondary | ICD-10-CM

## 2017-02-06 NOTE — Telephone Encounter (Signed)
Pt has not been seen in 6 months needs OV for clearance. Please arrange.

## 2017-02-06 NOTE — Telephone Encounter (Signed)
I spoke with pt and scheduled him to see R. Ursuy,PA on 10/18 at 8:30.  Abigail Butts in Nursing office at [place] notified of appointment.

## 2017-02-09 ENCOUNTER — Other Ambulatory Visit: Payer: Medicare Other

## 2017-02-09 ENCOUNTER — Ambulatory Visit
Admission: RE | Admit: 2017-02-09 | Discharge: 2017-02-09 | Disposition: A | Discharge status: Home / Self Care | Payer: Medicare Other | Source: Ambulatory Visit | Attending: General Surgery | Admitting: General Surgery

## 2017-02-09 DIAGNOSIS — R140 Abdominal distension (gaseous): Principal | ICD-10-CM

## 2017-02-09 DIAGNOSIS — R1033 Periumbilical pain: Secondary | ICD-10-CM | POA: Diagnosis not present

## 2017-02-09 DIAGNOSIS — R109 Unspecified abdominal pain: Secondary | ICD-10-CM

## 2017-02-09 MED ORDER — IOPAMIDOL (ISOVUE-300) INJECTION 61%: 100.0000 mL | Freq: Once | INTRAVENOUS | Status: DC | PRN

## 2017-02-14 ENCOUNTER — Ambulatory Visit (HOSPITAL_COMMUNITY)
Admission: RE | Admit: 2017-02-14 | Discharge: 2017-02-14 | Disposition: A | Discharge status: Home / Self Care | Payer: Medicare Other | Source: Ambulatory Visit | Attending: General Surgery | Admitting: General Surgery

## 2017-02-14 ENCOUNTER — Other Ambulatory Visit (HOSPITAL_COMMUNITY): Payer: Self-pay | Admitting: General Surgery

## 2017-02-14 DIAGNOSIS — R140 Abdominal distension (gaseous): Secondary | ICD-10-CM | POA: Diagnosis not present

## 2017-02-14 DIAGNOSIS — K635 Polyp of colon: Secondary | ICD-10-CM | POA: Diagnosis not present

## 2017-02-14 DIAGNOSIS — R109 Unspecified abdominal pain: Secondary | ICD-10-CM | POA: Diagnosis not present

## 2017-02-14 DIAGNOSIS — R7989 Other specified abnormal findings of blood chemistry: Secondary | ICD-10-CM | POA: Diagnosis not present

## 2017-02-14 DIAGNOSIS — I480 Paroxysmal atrial fibrillation: Secondary | ICD-10-CM | POA: Diagnosis not present

## 2017-02-14 DIAGNOSIS — K56600 Partial intestinal obstruction, unspecified as to cause: Secondary | ICD-10-CM | POA: Diagnosis not present

## 2017-02-14 DIAGNOSIS — Z6825 Body mass index (BMI) 25.0-25.9, adult: Secondary | ICD-10-CM | POA: Diagnosis not present

## 2017-02-14 DIAGNOSIS — I10 Essential (primary) hypertension: Secondary | ICD-10-CM | POA: Diagnosis not present

## 2017-02-14 NOTE — Progress Notes (Addendum)
Cardiology Office Note Date:  02/15/2017  Patient ID:  Arthur Obrien, Arthur Obrien 1940/11/04, MRN [id number] PCP:  Rory Percy, MD  Electrophysiologist:  Dr. Caryl Comes    Chief Complaint: pre-op evaluation  History of Present Illness: Arthur Obrien is a 76 y.o. male with history of PAFib, SB, HTN, HLD (the patient denies high cholesterol, states his PMD follows his labs/lipids, has never been told was high), colon and rectal ca.  He comes in to be seen for Dr. Caryl Comes, last seen by him in March of this year doing well without changes to his therapy.  The patient is pending potential colon surgery.  He has hx of colon cancer with prior colon surgery with abdominal complaints, he states suspsect to have some degree of blockage 2/2 scar tissue, this week has had a number of tests/scans and revisits with surgeon on Monday for final plan of care.  He comes today to be seen for pre-operative cardiac evaluation.  The patient outside of waxing/waning abdominal discomfort is feeling very well.  He denies any kind of CP or SOB, no DOE. He will feel his heart speed up very rarely, in the last year only once, lasted less then a minute and without any associated symptoms.  He has not felt the need to use his PRN diltiazem in well over a year.  He denies any symptoms of bradycardia, no dizziness, near syncope or syncope.    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  No formal exercise, but push mows the trim of his yard, ditch/hills without any difficulty, can push mor for an hour without breaks or intolerances.  Last week help his sone move the "in-laws" carrying/lifting boxes, furniture also without difficulty and denies any difficulty walking stairs.  He denies any bleeding or signs of bleeding, xarelto is appropriately dose with calc Creat cl of 69 by his last labs   Past Medical History:  Diagnosis Date  . Abdominal pain, epigastric 12/14/2016  . Colon cancer  ([place]) 1992   rectal stage 3  . HTN (hypertension)   . Hx of echocardiogram    a.  Echo (6/14): EF 55-60%, mild-mod LAE, normal RV function, mild MR, RVSP 37 mmHg, mild pulmonary hypertension    . Hypercholesterolemia 02/08/2012  . PAF (paroxysmal atrial fibrillation) ([place]) 09/26/2012  . Rectal cancer ([place]) 02/08/2012   Stage III cancer of the rectum with resection earlier in 1992, followed by combination chemotherapy with 5-FU, leucovorin and levamisole in conjunction with radiation therapy to the pelvis by Dr. Nila Nephew, and he has had no evidence of recurrence thus far. His last colonoscopy was by Dr. Benson Norway in November 2010 and for his next one, he would like to see Dr. Hildred Laser.  Dr. Posey Rea  . Sinus bradycardia 09/26/2012    Past Surgical History:  Procedure Laterality Date  . BIOPSY  01/10/2017   Procedure: BIOPSY;  Surgeon: Rogene Houston, MD;  [facility];  Service: Endoscopy;;  polyps @ body and fundus  . CATARACT EXTRACTION     right(years ago) left 06/2010  . COLON SURGERY    . COLONOSCOPY N/A 01/10/2017   Procedure: COLONOSCOPY;  Surgeon: Rogene Houston, MD;  Location: [facility];  Service: Endoscopy;  Laterality: N/A;  . ESOPHAGOGASTRODUODENOSCOPY N/A 01/10/2017   Procedure: ESOPHAGOGASTRODUODENOSCOPY (EGD);  Surgeon: Rogene Houston, MD;  Location[facility];  Service: Endoscopy;  Laterality: N/A;  12:45-rescheduled to 9/12 @ 2:00pm  per Lelon Frohlich  . POLYPECTOMY  01/10/2017   Procedure: POLYPECTOMY;  Surgeon: Rogene Houston, MD;  Location[facility];  Service: Endoscopy;;  ascending colon, transverse colon, and splenic flexure  . TONSILLECTOMY      Current Outpatient Prescriptions  Medication Sig Dispense Refill  . acetaminophen (TYLENOL) 500 MG tablet Take 1,000 mg by mouth every 6 (six) hours as needed for mild pain or headache.    . Cyanocobalamin 2500 MCG TABS Take 5,000 mcg by mouth daily. B12    . diltiazem (CARDIZEM) 30 MG tablet  TAKE ONE TABLET BY MOUTH EVERY 4 HOURS AS NEEDED FOR HEART PALPITATIONS/ RACING. 90 tablet 1  . diltiazem (TIAZAC) 120 MG 24 hr capsule TAKE ONE CAPSULE BY MOUTH DAILY. 90 capsule 3  . diphenoxylate-atropine (LOMOTIL) 2.5-0.025 MG per tablet Take 1-2 tablets PO every 6 hours PRN diarrhea 100 tablet 4  . GAVILYTE-N WITH FLAVOR PACK 420 g solution MIX AND DRINK 4,094mLS BY MOUTH ONCE AS DIRECTED.  0  . Hypromellose (ARTIFICIAL TEARS OP) Place 1 drop into both eyes every 4 (four) hours as needed (dry eyes).    Marland Kitchen lisinopril (PRINIVIL,ZESTRIL) 5 MG tablet TAKE ONE TABLET BY MOUTH DAILY. 90 tablet 3  . Multiple Vitamin (MULTIVITAMIN) tablet Take 1 tablet by mouth daily.      . pantoprazole (PROTONIX) 40 MG tablet Take 1 tablet (40 mg total) by mouth 2 (two) times daily before a meal. 60 tablet 5  . rivaroxaban (XARELTO) 20 MG TABS tablet TAKE ONE TABLET BY MOUTH DAILY WITH SUPPER. 30 tablet 6  . simethicone (MYLICON) 702 MG chewable tablet Chew 125 mg by mouth every 6 (six) hours as needed for flatulence.     No current facility-administered medications for this visit.     Allergies:   Patient has no known allergies.   Social History:  The patient  reports that he has never smoked. He has never used smokeless tobacco. He reports that he does not drink alcohol or use drugs.   Family History:  The patient's family history includes Cancer in his daughter and father; Hypertension in his mother.  ROS:  Please see the history of present illness. All other systems are reviewed and otherwise negative.   PHYSICAL EXAM:  VS:  BP 140/62   Pulse (!) 54   Ht 5\' 10"  (1.778 m)   Wt 176 lb (79.8 kg)   BMI 25.25 kg/m  BMI: Body mass index is 25.25 kg/m. Well nourished, well developed, in no acute distress  HEENT: normocephalic, atraumatic  Neck: no JVD, carotid bruits or masses Cardiac:  RRR; no significant murmurs are appreciated no rubs, or gallops Lungs:  CTA b/l, no wheezing, rhonchi or rales  Abd:  soft, nontender MS: no deformity or atrophy Ext:  no edema  Skin: warm and dry, no rash Neuro:  No gross deficits appreciated Psych: euthymic mood, full affect    EKG:  Done today shows SB 54bpm, PR 160ms, QRS 58ms, QTc 323ms, unchanged from prior  06/26/12: TTE LVEF 55-60%, LA mild-mod dilated Mild AV calcification, mild AI Mild MR RV 51mmHg Mild p.HTN   Recent Labs: 06/29/2016: BUN 11; Creatinine, Ser 0.99; Hemoglobin 12.2; Platelets 268; Potassium 4.3; Sodium 145  No results found for requested labs within last 8760 hours.   CrCl cannot be calculated (Patient's most recent lab result is older than the maximum 21 days allowed.).   Wt Readings from Last 3 Encounters:  02/15/17 176 lb (79.8 kg)  01/10/17 175  lb (79.4 kg)  12/14/16 179 lb (81.2 kg)     Other studies reviewed: Additional studies/records reviewed today include: summarized above  ASSESSMENT AND PLAN:  1. Paroxysmal Afib     CHA2DS2Vasc is at least 3, on Xarelto     RPH has addressed peri-operative a/c management     No symptoms of bradycardia     Will get BMET and CBC today given a/c  2. HTN     Looks OK, no changes  3. Pre-op     RCRI is <1 (assuming colon resection is surgery planned), he has good exertional capacity, able to push mow his grass, able to climb stairs without difficulty     I do not anticipate any need for any additional cardiac pre-operative testing/evaluation     The patient will let us know after his visit with the surgeon Monday what the final plan is and we can make formal comment regarding cardiac surgiacl risk      Disposition: F/u with [place] as previosuly planned otherwise, sooner if needed.    Current medicines are reviewed at length with the patient today.  The patient did not have any concerns regarding medicines.  Venetia Night, PA-C 02/15/2017 8:52 AM     [facility] [place] [address] [phone] (office)  [phone] (fax)

## 2017-02-15 ENCOUNTER — Ambulatory Visit (INDEPENDENT_AMBULATORY_CARE_PROVIDER_SITE_OTHER): Payer: Medicare Other | Admitting: Physician Assistant

## 2017-02-15 VITALS — BP 140/62 | HR 54 | Ht 70.0 in | Wt 176.0 lb

## 2017-02-15 DIAGNOSIS — Z01818 Encounter for other preprocedural examination: Secondary | ICD-10-CM | POA: Diagnosis not present

## 2017-02-15 DIAGNOSIS — I480 Paroxysmal atrial fibrillation: Secondary | ICD-10-CM

## 2017-02-15 DIAGNOSIS — Z79899 Other long term (current) drug therapy: Secondary | ICD-10-CM | POA: Diagnosis not present

## 2017-02-15 DIAGNOSIS — I10 Essential (primary) hypertension: Secondary | ICD-10-CM

## 2017-02-15 NOTE — Patient Instructions (Addendum)
Medication Instructions:   Your physician recommends that you continue on your current medications as directed. Please refer to the Current Medication list given to you today.   If you need a refill on your cardiac medications before your next appointment, please call your pharmacy.  Labwork:BMET AND CBC TODAY    Testing/Procedures: NONE ORDERED  TODAY    Follow-Up: IN MARCH WITH KLEIN OR URSUY    Any Other Special Instructions Will Be Listed Below (If Applicable).

## 2017-02-16 LAB — BASIC METABOLIC PANEL
BUN / CREAT RATIO: 10 (ref 10–24)
BUN: 9 mg/dL (ref 8–27)
CHLORIDE: 104 mmol/L (ref 96–106)
CO2: 26 mmol/L (ref 20–29)
Calcium: 9 mg/dL (ref 8.6–10.2)
Creatinine, Ser: 0.94 mg/dL (ref 0.76–1.27)
GFR, EST AFRICAN AMERICAN: 89 mL/min/{1.73_m2} (ref >59)
GFR, EST NON AFRICAN AMERICAN: 77 mL/min/{1.73_m2} (ref >59)
Glucose: 90 mg/dL (ref 65–99)
POTASSIUM: 4.4 mmol/L (ref 3.5–5.2)
SODIUM: 144 mmol/L (ref 134–144)

## 2017-02-16 LAB — CBC
HEMATOCRIT: 35.5 % — AB (ref 37.5–51.0)
Hemoglobin: 11.9 g/dL — ABNORMAL LOW (ref 13.0–17.7)
MCH: 29.4 pg (ref 26.6–33.0)
MCHC: 33.5 g/dL (ref 31.5–35.7)
MCV: 88 fL (ref 79–97)
PLATELETS: 266 10*3/uL (ref 150–379)
RBC: 4.05 x10E6/uL — ABNORMAL LOW (ref 4.14–5.80)
RDW: 14.2 % (ref 12.3–15.4)
WBC: 7.8 10*3/uL (ref 3.4–10.8)

## 2017-02-19 ENCOUNTER — Other Ambulatory Visit: Payer: Self-pay | Admitting: Surgery

## 2017-02-19 ENCOUNTER — Ambulatory Visit: Payer: Self-pay | Admitting: Surgery

## 2017-02-19 DIAGNOSIS — D126 Benign neoplasm of colon, unspecified: Secondary | ICD-10-CM

## 2017-02-19 NOTE — H&P (Signed)
History of Present Illness Arthur Obrien M. Arthur Tamburo MD; 02/19/2017 3:31 PM) Patient words: [name] is a 76 year old male with hx of HTN, HLD, AFib on Xarelto - referred to me by Dr. Redmond Obrien for surgical management of endoscopically unresectable colon polyps. He has a history of rectal cancer and underwent a LAR with colorectal anastomosis in 1992 followed by adjuvant chemoradiation therapy.  He has been told he was cured of this and in remission.  He underwent a high-risk screening colonoscopy 01/10/17 with Dr. Laural Obrien.  There are multiple tubular adenomas found in the colon on the colonoscopy - polyps are found in the ascending colon, hepatic flexure, transverse colon and splenic flexure.  By documentation there is a 1-2cm polyp in the proximal transverse colon which was carpet-like and incompletely removed - this was not tattooed; clips were placed.  An additional greater than 5cm polyp was found in the splenic flexure and partially removed, this one reportedly was tattoo'd.  He isn't referred to surgery for consideration of resection.  He saw Dr. Redmond Obrien in the office. He subsequently had a CT enterography for intermittent bloating and diarrhea which showed at least a partial small bowel obstruction with a potential transition point.  Since that test, however, he states he has been feeling well and tolerating a diet, no nausea or vomiting and having daily bowel movements - in fact has some degree of ongoing loose stool - occasionally takes lomotil. No distention today.  He reports 1-2 episodes per week where he has incontinence of stool particularly when the stool is liquid and has an accident in his underwear.  He does not wear diapers.  He occasionally changes his daily activities if he is concerned that his stools became more liquid because of fear of incontinence.  PMH: Afib (rate controlled on diltiazem; also takes Xarelto); HTN  PSH: Rectal CA s/p LAR 1992 + postop chemoXRT  FHx: Denies FHx of  malignancy.   Allergies Arthur Obrien, [place]; 02/19/2017 9:08 AM) No Known Drug Allergies  02/01/2017 Allergies Reconciled    Medication History Arthur Obrien, [place]; 02/19/2017 9:08 AM) DilTIAZem HCl ER Beads  (120MG  Capsule ER 24HR, Oral) Active. Lisinopril  (5MG  Tablet, Oral) Active. Xarelto  (20MG  Tablet, Oral) Active. Pantoprazole Sodium  (40MG  Tablet DR, Oral) Active. IBU  (800MG  Tablet, Oral) Active. Cyanocobalamin  (1000MCG Tablet, Oral) Active. Lomotil  (2.5-0.025MG  Tablet, Oral) Active. Simethicone  (125MG  Capsule, Oral) Active. Medications Reconciled     Review of Systems Arthur Obrien M. Arthur Dunaj MD; 02/19/2017 3:25 PM) General Present- Weight Loss. Not Present- Appetite Loss, Chills, Fatigue, Fever, Night Sweats and Weight Gain. Skin Not Present- Change in Wart/Mole, Dryness, Hives, Jaundice, New Lesions, Non-Healing Wounds, Rash and Ulcer. HEENT Present- Hearing Loss and Ringing in the Ears. Not Present- Earache, Hoarseness, Nose Bleed, Oral Ulcers, Seasonal Allergies, Sinus Pain, Sore Throat, Visual Disturbances, Wears glasses/contact lenses and Yellow Eyes. Respiratory Not Present- Bloody sputum, Chronic Cough, Difficulty Breathing, Snoring and Wheezing. Breast Not Present- Breast Mass, Breast Pain, Nipple Discharge and Skin Changes. Cardiovascular Present- Palpitations and Rapid Heart Rate. Not Present- Chest Pain, Difficulty Breathing Lying Down, Leg Cramps, Shortness of Breath and Swelling of Extremities. Gastrointestinal Present- Abdominal Pain, Bloating and Chronic diarrhea. Not Present- Bloody Stool, Change in Bowel Habits, Constipation, Difficulty Swallowing, Excessive gas, Gets full quickly at meals, Hemorrhoids, Indigestion, Nausea, Rectal Pain and Vomiting. Musculoskeletal Not Present- Back Pain, Joint Pain, Joint Stiffness, Muscle Pain, Muscle Weakness and Swelling of Extremities. Neurological Not Present- Decreased Memory, Fainting, Headaches, Numbness, Seizures,  Tingling, Tremor, Trouble walking and Weakness. Psychiatric Not Present- Anxiety, Bipolar, Change in Sleep Pattern, Depression, Fearful and Frequent crying. Endocrine Not Present- Cold Intolerance, Excessive Hunger, Hair Changes, Heat Intolerance, Hot flashes and New Diabetes. Hematology Present- Blood Thinners. Not Present- Easy Bruising, Excessive bleeding, Gland problems, HIV and Persistent Infections.  Vitals Arthur Obrien RMA; 02/19/2017 9:08 AM) 02/19/2017 9:07 AM Weight: 173.4 lb   Height: 70 in  Body Surface Area: 1.96 m   Body Mass Index: 24.88 kg/m   Temp.: 98.7 F    Pulse: 75 (Regular)    BP: 150/82 (Sitting, Left Arm, Standard)       Physical Exam Arthur Obrien M. Arthur Mccarver MD; 02/19/2017 3:36 PM) The physical exam findings are as follows: Note: constitutional: No acute distress, no deformities Eyes: Moist conjunctiva, no lid lag, anicteric, pupils equal round reactive to light Neck: Trachea midline; no thyromegaly Lungs: normal respiratory effort; no tactile fremitus CV: irregular rate/rhythm; no palpable thrills; no pitting edema GI: Abd soft, NT/ND; no palpable hepatosplenomegaly MSK: Normal gait; no clubbing/cyanosis Psych: Appropriate affect; A&O x3 Lymphatic: No palpable cervical or axillary lymphadenopathy  Rectal Note:  No palpable masses; anastomotic ring 4-5cm from anal verge palpable at tip of my index finger; patent. STool in rectal vault, no gross blood     Assessment & Plan Arthur Obrien M. Arthur Lemoine MD; 02/19/2017 3:46 PM) TUBULOVILLOUS ADENOMA POLYP OF COLON (D12.6) Impression: 84M hx HTN, HLD, AFib on Xarelto, referred by Dr. Redmond Obrien for evaluation of multiple colon polyps - incomplete removal of the polyp in proximal transverse colon as well as splenic flexure where there were 'multiple >5cm polyps,' however, attempted destruction endoscopically with Argon beam may render these lesions absent.  We discussed his colonoscopy report in detail. We discussed  that he has a "precancerous "lesion in the vicinity of the splenic flexure. Unfortunately it was not tattooed. Dr. Redmond Obrien has verbally confirmed this with his gastroenterologist. I explained that we will not be able to detect this during surgery since it is a small lesion and therefore he will need repeat colonoscopy with tattooing prior to colon resection. -Additionally, by report of Dr. Laural Obrien, all of the polypoid tissue that was not removed was ablated with Argon beam -His LAR anastomosis - I believe I can palpate the circumferential staple line but will need to confirm this level during colonoscopy - additionally, we need to ensure all the lesions are tattoo'd to determine extent of colectomy, should this be the route he elects to take. At this time, I believe should surgery be necessary, he would potentially be facing a completion colectomy with end ileostomy - especially given concerns about control which he is already having. He will need cysto/stents given that this will be a reoperative pelvic surgery. If all of this is the case, this will be a permenant ileostomy and this was explained to the patient. -CT Chest -CEA -Cardiac clearance obtained 02/15/17 by [facility] in [facility] -Return to office to discuss next steps following colonoscopy  Sharon Mt. Dema Severin, ArthurD. General and Colorectal Surgery [facility]

## 2017-02-20 ENCOUNTER — Encounter (HOSPITAL_COMMUNITY): Payer: Self-pay

## 2017-02-28 ENCOUNTER — Ambulatory Visit (HOSPITAL_COMMUNITY)
Admission: RE | Admit: 2017-02-28 | Discharge: 2017-02-28 | Disposition: A | Discharge status: Home / Self Care | Payer: Medicare Other | Source: Ambulatory Visit | Attending: Surgery | Admitting: Surgery

## 2017-02-28 ENCOUNTER — Encounter (HOSPITAL_COMMUNITY): Payer: Self-pay

## 2017-02-28 ENCOUNTER — Ambulatory Visit (HOSPITAL_COMMUNITY): Payer: Medicare Other | Admitting: Anesthesiology

## 2017-02-28 ENCOUNTER — Encounter (HOSPITAL_COMMUNITY)
Admission: RE | Disposition: A | Discharge status: Home / Self Care | Payer: Self-pay | Source: Ambulatory Visit | Attending: Surgery

## 2017-02-28 DIAGNOSIS — D124 Benign neoplasm of descending colon: Secondary | ICD-10-CM | POA: Diagnosis not present

## 2017-02-28 DIAGNOSIS — Z85048 Personal history of other malignant neoplasm of rectum, rectosigmoid junction, and anus: Secondary | ICD-10-CM | POA: Diagnosis not present

## 2017-02-28 DIAGNOSIS — R1084 Generalized abdominal pain: Secondary | ICD-10-CM | POA: Insufficient documentation

## 2017-02-28 DIAGNOSIS — D122 Benign neoplasm of ascending colon: Secondary | ICD-10-CM | POA: Diagnosis not present

## 2017-02-28 DIAGNOSIS — K635 Polyp of colon: Secondary | ICD-10-CM | POA: Diagnosis not present

## 2017-02-28 DIAGNOSIS — E785 Hyperlipidemia, unspecified: Secondary | ICD-10-CM | POA: Diagnosis not present

## 2017-02-28 DIAGNOSIS — Z86010 Personal history of colonic polyps: Secondary | ICD-10-CM | POA: Insufficient documentation

## 2017-02-28 DIAGNOSIS — I4891 Unspecified atrial fibrillation: Secondary | ICD-10-CM | POA: Diagnosis not present

## 2017-02-28 DIAGNOSIS — I10 Essential (primary) hypertension: Secondary | ICD-10-CM | POA: Insufficient documentation

## 2017-02-28 DIAGNOSIS — Z79899 Other long term (current) drug therapy: Secondary | ICD-10-CM | POA: Insufficient documentation

## 2017-02-28 DIAGNOSIS — Z7901 Long term (current) use of anticoagulants: Secondary | ICD-10-CM | POA: Insufficient documentation

## 2017-02-28 DIAGNOSIS — Z980 Intestinal bypass and anastomosis status: Secondary | ICD-10-CM | POA: Diagnosis not present

## 2017-02-28 DIAGNOSIS — R1013 Epigastric pain: Secondary | ICD-10-CM | POA: Diagnosis not present

## 2017-02-28 DIAGNOSIS — D123 Benign neoplasm of transverse colon: Secondary | ICD-10-CM | POA: Diagnosis not present

## 2017-02-28 HISTORY — DX: Gastro-esophageal reflux disease without esophagitis: K21.9

## 2017-02-28 HISTORY — PX: COLONOSCOPY WITH PROPOFOL: SHX5780

## 2017-02-28 SURGERY — COLONOSCOPY WITH PROPOFOL
Anesthesia: General

## 2017-02-28 MED ORDER — SPOT INK MARKER SYRINGE KIT
PACK | SUBMUCOSAL | Status: AC
  Filled 2017-02-28: qty 10

## 2017-02-28 MED ORDER — SODIUM CHLORIDE 0.9 % IV SOLN: INTRAVENOUS | Status: DC

## 2017-02-28 MED ORDER — SPOT INK MARKER SYRINGE KIT
PACK | SUBMUCOSAL | Status: DC | PRN
  Administered 2017-02-28: 3.5 mL via SUBMUCOSAL

## 2017-02-28 MED ORDER — RIVAROXABAN 20 MG PO TABS: 20.0000 mg | ORAL_TABLET | Freq: Every day | ORAL | 6 refills | Status: DC

## 2017-02-28 MED ORDER — ONDANSETRON HCL 4 MG/2ML IJ SOLN
INTRAMUSCULAR | Status: AC
  Filled 2017-02-28: qty 2

## 2017-02-28 MED ORDER — PROPOFOL 10 MG/ML IV BOLUS
INTRAVENOUS | Status: AC
  Filled 2017-02-28: qty 20

## 2017-02-28 MED ORDER — ONDANSETRON HCL 4 MG/2ML IJ SOLN
INTRAMUSCULAR | Status: DC | PRN
  Administered 2017-02-28: 4 mg via INTRAVENOUS

## 2017-02-28 MED ORDER — PROPOFOL 500 MG/50ML IV EMUL
INTRAVENOUS | Status: DC | PRN
  Administered 2017-02-28: 75 ug/kg/min via INTRAVENOUS

## 2017-02-28 MED ORDER — SPOT INK MARKER SYRINGE KIT
PACK | SUBMUCOSAL | Status: AC
  Filled 2017-02-28: qty 5

## 2017-02-28 MED ORDER — PROPOFOL 10 MG/ML IV BOLUS
INTRAVENOUS | Status: DC | PRN
  Administered 2017-02-28: 20 mg via INTRAVENOUS
  Administered 2017-02-28: 50 mg via INTRAVENOUS
  Administered 2017-02-28 (×6): 20 mg via INTRAVENOUS

## 2017-02-28 MED ORDER — PROPOFOL 10 MG/ML IV BOLUS
INTRAVENOUS | Status: AC
  Filled 2017-02-28: qty 40

## 2017-02-28 MED ORDER — LACTATED RINGERS IV SOLN
INTRAVENOUS | Status: DC
  Administered 2017-02-28: 08:00:00 via INTRAVENOUS

## 2017-02-28 NOTE — Op Note (Signed)
[facility] Patient Name: Arthur Obrien Procedure Date: 02/28/2017 MRN: [id number] Attending MD: Ileana Roup MD, MD Date of Birth: Feb 05, 1941 CSN: [id number] Age: 76 Admit Type: Outpatient Procedure:                Colonoscopy Indications:              Generalized abdominal pain, Follow-up for history                            of adenomatous polyps in the colon, Personal                            history of malignant rectal neoplasm, Personal                            history of colonic polyps, Preoperative assessment Providers:                Sharon Mt. Hannie Shoe MD, MD, Burtis Junes, RN, Corliss Parish, Technician Referring MD:              Medicines:                Monitored Anesthesia Care Complications:            No immediate complications. Estimated Blood Loss:     Estimated blood loss was minimal. Procedure:                Pre-Anesthesia Assessment:                           - Prior to the procedure, a History and Physical                            was performed, and patient medications, allergies                            and sensitivities were reviewed. The patient's                            tolerance of previous anesthesia was reviewed.                           - The risks and benefits of the procedure and the                            sedation options and risks were discussed with the                            patient. All questions were answered and informed                            consent was obtained.                           - Patient identification and  proposed procedure                            were verified prior to the procedure by the                            physician, the nurse and the anesthetist. The                            procedure was verified in the pre-procedure area in                            the endoscopy suite.                           - ASA Grade Assessment: III - A patient  with severe                            systemic disease.                           - The anesthesia plan was to use monitored                            anesthesia care (MAC).                           After obtaining informed consent, the colonoscope                            was passed under direct vision. Throughout the                            procedure, the patient's blood pressure, pulse, and                            oxygen saturations were monitored continuously. The                            EC-[id number] ([id number]) scope was introduced through                            the anus and advanced to the the cecum, identified                            by appendiceal orifice and ileocecal valve. The                            colonoscopy was performed without difficulty. The                            patient tolerated the procedure well. The quality                            of the bowel preparation  was adequate. Scope In: 0:98:11 AM Scope Out: 9:21:30 AM Scope Withdrawal Time: 0 hours 22 minutes 47 seconds  Total Procedure Duration: 0 hours 35 minutes 17 seconds  Findings:      Colorectal anastomosis palpable a tip of finger (~6-7cm from anal       verge); patent      A 8 mm polyp was found in the proximal ascending colon. The polyp was       semi-sessile and already marked with a single clip - there was residual       polypoid tissue here      A 40 mm polyp was found in the proximal/mid transverse colon. The polyp       was carpet-like. Polypectomy was not attempted due to polyp size (too       large to be excised). This was previously tattoo'd and the tattoo was       seen endoscopically.      A 40 mm polyp was found in the distal transverse colon. The polyp was       carpet-like. Polypectomy was not attempted due to polyp size (too large       to be excised). This had a small residual tattoo seen endoscopically.       Area was successfully injected with an additional 4 mL  Spot (carbon       black) for surgical marking. Estimated blood loss was minimal.      Multiple semi-sessile polyps were found in the descending colon       extending to within 10cm of the prior colorectal anastomosis. The polyps       were 4 to 12 mm in size. Polypectomy was not attempted due to other       unresectable polyps in the vicinity and surgical plan being a completion       colectomy. Estimated blood loss: none.      There was evidence of a prior end-to-end colorectal anastomosis in the       mid rectum. This was patent and was characterized by healthy appearing       mucosa. The anastomosis was traversed.      The exam was otherwise without abnormality. Impression:               - One 8 mm polyp in the proximal ascending colon.                           - One 40 mm polyp in the mid transverse colon.                            Resection not attempted.                           - One 40 mm polyp in the distal transverse colon.                            Resection not attempted. Injected.                           - Multiple 4 to 12 mm polyps in the descending  colon. Resection not attempted.                           - Patent end-to-end colo-colonic anastomosis,                            characterized by healthy appearing mucosa.                           - The examination was otherwise normal.                           - No specimens collected. Moderate Sedation:      N/A- Per Anesthesia Care Recommendation:           - Discharge patient to home (with spouse).                           - Resume previous diet.                           - Resume Xarelto (rivaroxaban) at prior dose                            tomorrow.                           - Return to my office in 1 week. Procedure Code(s):        --- Professional ---                           [phone], Colonoscopy, flexible; diagnostic, including                            collection of specimen(s)  by brushing or washing,                            when performed (separate procedure) Diagnosis Code(s):        --- Professional ---                           D12.2, Benign neoplasm of ascending colon                           D12.3, Benign neoplasm of transverse colon (hepatic                            flexure or splenic flexure)                           D12.4, Benign neoplasm of descending colon                           Z98.0, Intestinal bypass and anastomosis status                           R10.84, Generalized abdominal pain  Z86.010, Personal history of colonic polyps                           Z85.048, Personal history of other malignant                            neoplasm of rectum, rectosigmoid junction, and anus                           Z01.818, Encounter for other preprocedural                            examination CPT copyright 2016 [employer]. All rights reserved. The codes documented in this report are preliminary and upon coder review may  be revised to meet current compliance requirements. Nadeen Landau, MD Ileana Roup MD, MD 02/28/2017 9:47:39 AM This report has been signed electronically. Number of Addenda: 0

## 2017-02-28 NOTE — H&P (View-Only) (Signed)
History of Present Illness Arthur Obrien M. Arthur Pasch MD; 02/19/2017 3:31 PM) Patient words: [name] is a 76 year old male with hx of HTN, HLD, AFib on Xarelto - referred to me by Dr. Redmond Pulling for surgical management of endoscopically unresectable colon polyps. He has a history of rectal cancer and underwent a LAR with colorectal anastomosis in 1992 followed by adjuvant chemoradiation therapy.  He has been told he was cured of this and in remission.  He underwent a high-risk screening colonoscopy 01/10/17 with Dr. Laural Golden.  There are multiple tubular adenomas found in the colon on the colonoscopy - polyps are found in the ascending colon, hepatic flexure, transverse colon and splenic flexure.  By documentation there is a 1-2cm polyp in the proximal transverse colon which was carpet-like and incompletely removed - this was not tattooed; clips were placed.  An additional greater than 5cm polyp was found in the splenic flexure and partially removed, this one reportedly was tattoo'd.  He isn't referred to surgery for consideration of resection.  He saw Dr. Redmond Pulling in the office. He subsequently had a CT enterography for intermittent bloating and diarrhea which showed at least a partial small bowel obstruction with a potential transition point.  Since that test, however, he states he has been feeling well and tolerating a diet, no nausea or vomiting and having daily bowel movements - in fact has some degree of ongoing loose stool - occasionally takes lomotil. No distention today.  He reports 1-2 episodes per week where he has incontinence of stool particularly when the stool is liquid and has an accident in his underwear.  He does not wear diapers.  He occasionally changes his daily activities if he is concerned that his stools became more liquid because of fear of incontinence.  PMH: Afib (rate controlled on diltiazem; also takes Xarelto); HTN  PSH: Rectal CA s/p LAR 1992 + postop chemoXRT  FHx: Denies FHx of  malignancy.   Allergies Arthur Obrien, Arthur Obrien; 02/19/2017 9:08 AM) No Known Drug Allergies  02/01/2017 Allergies Reconciled    Medication History Arthur Obrien, Arthur Obrien; 02/19/2017 9:08 AM) DilTIAZem HCl ER Beads  (120MG  Capsule ER 24HR, Oral) Active. Lisinopril  (5MG  Tablet, Oral) Active. Xarelto  (20MG  Tablet, Oral) Active. Pantoprazole Sodium  (40MG  Tablet DR, Oral) Active. IBU  (800MG  Tablet, Oral) Active. Cyanocobalamin  (1000MCG Tablet, Oral) Active. Lomotil  (2.5-0.025MG  Tablet, Oral) Active. Simethicone  (125MG  Capsule, Oral) Active. Medications Reconciled     Review of Systems Arthur Obrien M. Arthur Desa MD; 02/19/2017 3:25 PM) General Present- Weight Loss. Not Present- Appetite Loss, Chills, Fatigue, Fever, Night Sweats and Weight Gain. Skin Not Present- Change in Wart/Mole, Dryness, Hives, Jaundice, New Lesions, Non-Healing Wounds, Rash and Ulcer. HEENT Present- Hearing Loss and Ringing in the Ears. Not Present- Earache, Hoarseness, Nose Bleed, Oral Ulcers, Seasonal Allergies, Sinus Pain, Sore Throat, Visual Disturbances, Wears glasses/contact lenses and Yellow Eyes. Respiratory Not Present- Bloody sputum, Chronic Cough, Difficulty Breathing, Snoring and Wheezing. Breast Not Present- Breast Mass, Breast Pain, Nipple Discharge and Skin Changes. Cardiovascular Present- Palpitations and Rapid Heart Rate. Not Present- Chest Pain, Difficulty Breathing Lying Down, Leg Cramps, Shortness of Breath and Swelling of Extremities. Gastrointestinal Present- Abdominal Pain, Bloating and Chronic diarrhea. Not Present- Bloody Stool, Change in Bowel Habits, Constipation, Difficulty Swallowing, Excessive gas, Gets full quickly at meals, Hemorrhoids, Indigestion, Nausea, Rectal Pain and Vomiting. Musculoskeletal Not Present- Back Pain, Joint Pain, Joint Stiffness, Muscle Pain, Muscle Weakness and Swelling of Extremities. Neurological Not Present- Decreased Memory, Fainting, Headaches, Numbness, Seizures,  Tingling, Tremor, Trouble walking and Weakness. Psychiatric Not Present- Anxiety, Bipolar, Change in Sleep Pattern, Depression, Fearful and Frequent crying. Endocrine Not Present- Cold Intolerance, Excessive Hunger, Hair Changes, Heat Intolerance, Hot flashes and New Diabetes. Hematology Present- Blood Thinners. Not Present- Easy Bruising, Excessive bleeding, Gland problems, HIV and Persistent Infections.  Vitals Arthur Obrien RMA; 02/19/2017 9:08 AM) 02/19/2017 9:07 AM Weight: 173.4 lb   Height: 70 in  Body Surface Area: 1.96 m   Body Mass Index: 24.88 kg/m   Temp.: 98.7 F    Pulse: 75 (Regular)    BP: 150/82 (Sitting, Left Arm, Standard)       Physical Exam Arthur Obrien M. Arthur Petrak MD; 02/19/2017 3:36 PM) The physical exam findings are as follows: Note: constitutional: No acute distress, no deformities Eyes: Moist conjunctiva, no lid lag, anicteric, pupils equal round reactive to light Neck: Trachea midline; no thyromegaly Lungs: normal respiratory effort; no tactile fremitus CV: irregular rate/rhythm; no palpable thrills; no pitting edema GI: Abd soft, NT/ND; no palpable hepatosplenomegaly MSK: Normal gait; no clubbing/cyanosis Psych: Appropriate affect; A&O x3 Lymphatic: No palpable cervical or axillary lymphadenopathy  Rectal Note:  No palpable masses; anastomotic ring 4-5cm from anal verge palpable at tip of my index finger; patent. STool in rectal vault, no gross blood     Assessment & Plan Arthur Obrien M. Arthur Weissberg MD; 02/19/2017 3:46 PM) TUBULOVILLOUS ADENOMA POLYP OF COLON (D12.6) Impression: 77M hx HTN, HLD, AFib on Xarelto, referred by Dr. Redmond Pulling for evaluation of multiple colon polyps - incomplete removal of the polyp in proximal transverse colon as well as splenic flexure where there were 'multiple >5cm polyps,' however, attempted destruction endoscopically with Argon beam may render these lesions absent.  We discussed his colonoscopy report in detail. We discussed  that he has a "precancerous "lesion in the vicinity of the splenic flexure. Unfortunately it was not tattooed. Dr. Redmond Pulling has verbally confirmed this with his gastroenterologist. I explained that we will not be able to detect this during surgery since it is a small lesion and therefore he will need repeat colonoscopy with tattooing prior to colon resection. -Additionally, by report of Dr. Laural Golden, all of the polypoid tissue that was not removed was ablated with Argon beam -His LAR anastomosis - I believe I can palpate the circumferential staple line but will need to confirm this level during colonoscopy - additionally, we need to ensure all the lesions are tattoo'd to determine extent of colectomy, should this be the route he elects to take. At this time, I believe should surgery be necessary, he would potentially be facing a completion colectomy with end ileostomy - especially given concerns about control which he is already having. He will need cysto/stents given that this will be a reoperative pelvic surgery. If all of this is the case, this will be a permenant ileostomy and this was explained to the patient. -CT Chest -CEA -Cardiac clearance obtained 02/15/17 by [facility] in [facility] -Return to office to discuss next steps following colonoscopy  Sharon Mt. Dema Severin, ArthurD. General and Colorectal Surgery [facility]

## 2017-02-28 NOTE — Transfer of Care (Signed)
Immediate Anesthesia Transfer of Care Note  Patient: Arthur Obrien  Procedure(s) Performed: COLONOSCOPY WITH PROPOFOL WITH TATTOO (N/A )  Patient Location: PACU  Anesthesia Type:MAC  Level of Consciousness: awake  Airway & Oxygen Therapy: Patient Spontanous Breathing and Patient connected to face mask oxygen  Post-op Assessment: Report given to RN and Post -op Vital signs reviewed and stable  Post vital signs: Reviewed and stable  Last Vitals:  Vitals:   02/28/17 0721  BP: (!) 152/60  Pulse: 60  Resp: 14  Temp: 36.7 C  SpO2: 99%    Last Pain:  Vitals:   02/28/17 0721  TempSrc: Oral         Complications: No apparent anesthesia complications

## 2017-02-28 NOTE — Discharge Instructions (Signed)

## 2017-02-28 NOTE — Anesthesia Postprocedure Evaluation (Signed)
Anesthesia Post Note  Patient: Arthur Obrien  Procedure(s) Performed: COLONOSCOPY WITH PROPOFOL WITH TATTOO (N/A )     Patient location during evaluation: PACU Anesthesia Type: MAC Level of consciousness: awake and alert Pain management: pain level controlled Vital Signs Assessment: post-procedure vital signs reviewed and stable Respiratory status: spontaneous breathing, nonlabored ventilation, respiratory function stable and patient connected to nasal cannula oxygen Cardiovascular status: stable and blood pressure returned to baseline Postop Assessment: no apparent nausea or vomiting Anesthetic complications: no    Last Vitals:  Vitals:   02/28/17 0721 02/28/17 0932  BP: (!) 152/60 (!) 105/45  Pulse: 60 76  Resp: 14 17  Temp: 36.7 C 36.6 C  SpO2: 99% 100%    Last Pain:  Vitals:   02/28/17 0932  TempSrc: Oral                 Asuka Dusseau S

## 2017-02-28 NOTE — Interval H&P Note (Signed)
History and Physical Interval Note:  02/28/2017 8:35 AM  Arthur Obrien  has presented today for surgery, with the diagnosis of colon polyps prior colon cancer  The various methods of treatment have been discussed with the patient and family. After consideration of risks, benefits and other options for treatment, the patient has consented to  Procedure(s): COLONOSCOPY WITH PROPOFOL WITH TATTOO (N/A) as a surgical intervention .  The patient's history has been reviewed, patient examined, no change in status, stable for surgery.  I have reviewed the patient's chart and labs.  Questions were answered to the patient's satisfaction.    Sharon Mt. Dema Severin, M.D. General and Colorectal Surgery [facility]

## 2017-02-28 NOTE — Anesthesia Preprocedure Evaluation (Signed)
Anesthesia Evaluation  Patient identified by MRN, date of birth, ID band Patient awake    Reviewed: Allergy & Precautions, NPO status , Patient's Chart, lab work & pertinent test results  Airway Mallampati: II  TM Distance: >3 FB Neck ROM: Full    Dental no notable dental hx.    Pulmonary neg pulmonary ROS,    Pulmonary exam normal breath sounds clear to auscultation       Cardiovascular hypertension, Normal cardiovascular exam Rhythm:Regular Rate:Normal     Neuro/Psych negative neurological ROS  negative psych ROS   GI/Hepatic negative GI ROS, Neg liver ROS,   Endo/Other  negative endocrine ROS  Renal/GU negative Renal ROS  negative genitourinary   Musculoskeletal negative musculoskeletal ROS (+)   Abdominal   Peds negative pediatric ROS (+)  Hematology   Anesthesia Other Findings   Reproductive/Obstetrics negative OB ROS                             Anesthesia Physical Anesthesia Plan  ASA: III  Anesthesia Plan: General   Post-op Pain Management:    Induction: Intravenous  PONV Risk Score and Plan: 0  Airway Management Planned: Simple Face Mask  Additional Equipment:   Intra-op Plan:   Post-operative Plan:   Informed Consent: I have reviewed the patients History and Physical, chart, labs and discussed the procedure including the risks, benefits and alternatives for the proposed anesthesia with the patient or authorized representative who has indicated his/her understanding and acceptance.   Dental advisory given  Plan Discussed with: CRNA and Surgeon  Anesthesia Plan Comments:         Anesthesia Quick Evaluation

## 2017-03-05 ENCOUNTER — Ambulatory Visit: Payer: Self-pay | Admitting: Surgery

## 2017-03-05 NOTE — H&P (Signed)
Patient words: HPI: Here for f/u following colonoscopy hx of HTN, HLD, AFib on Xarelto - referred to me by Dr. Redmond Pulling for surgical management of endoscopically unresectable colon polyps. He has a history of rectal cancer and underwent a LAR with colorectal anastomosis in 1992 followed by adjuvant chemoradiation therapy.  He has been told he was cured of this and in remission.  He underwent a high-risk screening colonoscopy 01/10/17 with Dr. Laural Golden.  There are multiple tubular adenomas found in the colon on the colonoscopy - polyps are found in the ascending colon, hepatic flexure, transverse colon and splenic flexure.  By documentation there is a 1-2cm polyp in the proximal transverse colon which was carpet-like and incompletely removed - this was not tattooed; clips were placed.  An additional greater than 5cm polyp was found in the splenic flexure and partially removed, this one reportedly was tattoo'd. He saw Dr. Redmond Pulling in the office. He subsequently had a CT enterography for intermittent bloating and diarrhea which showed at least a partial small bowel obstruction with a potential transition point.  Since that test, however, he states he has been feeling well and tolerating a diet, no nausea or vomiting and having daily bowel movements - in fact has some degree of ongoing loose stool - occasionally takes lomotil. No distention today.  He reports 1-2 episodes per week where he has incontinence of stool particularly when the stool is liquid and has an accident in his underwear.  He does not wear diapers.  He occasionally changes his daily activities if he is concerned that his stools became more liquid because of fear of incontinence.  I repeated his colonoscopy last week and ensured marking of both large partially resected polyps. Additional polyps noted which were small and left in situ as the other larger polyps were endoscopically unretrievable and he would need colectomy to address.  PMH: Afib  (rate controlled on diltiazem; also takes Xarelto); HTN  PSH: Rectal CA s/p LAR 1992 + postop chemoXRT  FHx: Denies FHx of malignancy.  The patient is a 76 year old male.   Allergies (Tanisha A. Owens Shark, [place]; 03/05/2017 3:48 PM) No Known Drug Allergies  02/01/2017 Allergies Reconciled    Medication History (Tanisha A. Brown, [place]; 03/05/2017 3:48 PM) DilTIAZem HCl ER Beads  (120MG  Capsule ER 24HR, Oral) Active. Lisinopril  (5MG  Tablet, Oral) Active. Xarelto  (20MG  Tablet, Oral) Active. Pantoprazole Sodium  (40MG  Tablet DR, Oral) Active. IBU  (800MG  Tablet, Oral) Active. Cyanocobalamin  (1000MCG Tablet, Oral) Active. Lomotil  (2.5-0.025MG  Tablet, Oral) Active. Simethicone  (125MG  Capsule, Oral) Active. Medications Reconciled     Review of Systems Harrell Gave M. Sacora Hawbaker MD; 03/05/2017 5:20 PM) General Present- Weight Loss. Not Present- Appetite Loss, Chills, Fatigue, Fever, Night Sweats and Weight Gain. Skin Not Present- Change in Wart/Mole, Dryness, Hives, Jaundice, New Lesions, Non-Healing Wounds, Rash and Ulcer. HEENT Present- Hearing Loss and Ringing in the Ears. Not Present- Earache, Hoarseness, Nose Bleed, Oral Ulcers, Seasonal Allergies, Sinus Pain, Sore Throat, Visual Disturbances, Wears glasses/contact lenses and Yellow Eyes. Respiratory Not Present- Bloody sputum, Chronic Cough, Difficulty Breathing, Snoring and Wheezing. Breast Not Present- Breast Mass, Breast Pain, Nipple Discharge and Skin Changes. Cardiovascular Present- Palpitations and Rapid Heart Rate. Not Present- Chest Pain, Difficulty Breathing Lying Down, Leg Cramps, Shortness of Breath and Swelling of Extremities. Gastrointestinal Present- Abdominal Pain, Bloating and Chronic diarrhea. Not Present- Bloody Stool, Change in Bowel Habits, Constipation, Difficulty Swallowing, Excessive gas, Gets full quickly at meals, Hemorrhoids, Indigestion, Nausea, Rectal  Pain and Vomiting. Musculoskeletal Not Present- Back Pain, Joint  Pain, Joint Stiffness, Muscle Pain, Muscle Weakness and Swelling of Extremities. Neurological Not Present- Decreased Memory, Fainting, Headaches, Numbness, Seizures, Tingling, Tremor, Trouble walking and Weakness. Psychiatric Not Present- Anxiety, Bipolar, Change in Sleep Pattern, Depression, Fearful and Frequent crying. Endocrine Not Present- Cold Intolerance, Excessive Hunger, Hair Changes, Heat Intolerance, Hot flashes and New Diabetes. Hematology Present- Blood Thinners. Not Present- Easy Bruising, Excessive bleeding, Gland problems, HIV and Persistent Infections.  Vitals (Tanisha A. Brown RMA; 03/05/2017 3:48 PM) 03/05/2017 3:47 PM Weight: 177.6 lb   Height: 70 in  Body Surface Area: 1.98 m   Body Mass Index: 25.48 kg/m   Temp.: 98.2 F    Pulse: 60 (Regular)    BP: 128/74 (Sitting, Left Arm, Standard)       Physical Exam Harrell Gave M. Navon Kotowski MD; 03/05/2017 5:19 PM) The physical exam findings are as follows: Note: Constitutional: No acute distress; conversant; no deformities Eyes: Moist conjunctiva; no lid lag, anicteric pupils; pupils equal round reactive to light Neck: Trachea midline; no thyromegaly Lungs: Normal respiratory effort; no tactile fremitus CV: RRR; no palpable thrills; no pitting edema GI: Abdomen soft, NT; no palpable hepatosplenmegaly MSK: Normal gait; no clubbing/cyanosis Psych: Appropriate affect; alert and oriented x3 Lymphatic: No palpable cervical or axillary lymphadenopathy    Assessment & Plan Harrell Gave M. Seri Kimmer MD; 03/05/2017 5:27 PM) TUBULOVILLOUS ADENOMA POLYP OF COLON (D12.6) Impression: 43M hx HTN, HLD, AFib on Xarelto, prior rectal cancer s/p partial proctectomy with colorectal anastomosis at 6-7 cm. Referred by Dr. Redmond Pulling for evaluation of multiple colon polyps - incomplete removal of the polyp in proximal transverse colon as well as splenic flexure where there were 'multiple >5cm polyps,' attempted destruction endoscopically with Argon beam.  Repeat cscope by myself last week - ensured tattooing. Lesions remain present and carpeting. Additonal multiple polyps throughout colon.  -At this point, surgically his options include completion colectomy with end ileostomy. I don't believe an ileorectal anastomosis around 5cm with baseline issues with continence as it is will leave him with a meaningful quality of life and he agrees. -Plan laparoscopic vs open completion colectomy with end ileostomy; cysto and stents will be necessary given that he has a mid rectal colorectal anastomosis and then adjuvant chemoXRT. The ileostomy will be a permenant ileostomy and this was explained to the patient. -Genetics referral -CT Chest -CEA reviewed, <0.5 01/2017 -Cardiac clearance - will need plan in place for possible bridging preoperatively -Stoma marking by stoma nurse -The anatomy and physiology of the GI tract was explained using pictures and diagrams. The pathophysiology of polyps and natural course was described. The planned procedure including laparoscopic and open approach was described in detail. The material risks (including, but not limited to, pain, bleeding, infection, scarring, damage to surrounding structures including bowel blood vessels and ureters, need for additional procedures, high stoma output, dehydration, kidney failure, hernia, heart attack, stroke, death were described) benefits and alternatives to surgery were discussed. His and his wife's questions were answered to their satisfaction and they elected to proceed - he has requested a date at the end of this month.  Signed electronically by Ileana Roup, MD (03/05/2017 5:28 PM)

## 2017-03-09 ENCOUNTER — Telehealth: Payer: Self-pay | Admitting: *Deleted

## 2017-03-09 NOTE — Telephone Encounter (Signed)
   [facility] Pre-operative Risk Assessment    Request for surgical clearance:  1. What type of surgery is being performed? REMOVE COLON POLYPS - PRIOR RECTAL CANCER  2. When is this surgery scheduled? NEAR FUTURE   3. Are there any medications that need to be held prior to surgery and how long? PT TAKING XARELTO  4. Practice name and name of physician performing surgery? [facility]  5. What is your office phone and fax number? [phone] (ph) [phone] (FAX)  ATTN: [name], RMA 6. Anesthesia type (None, local, MAC, general) ? GENERAL  OFFICE NOTE WAS ATTACHED TO FAXED REQUESTP  BUT DISCARDED PER PROTOCOL  Arthur Obrien 03/09/2017, 3:29 PM  _________________________________________________________________   (provider comments below)

## 2017-03-09 NOTE — Telephone Encounter (Signed)
   Chart reviewed as part of pre-operative protocol coverage. Previously evaluated in clinic in October for pending procedure and felt to be acceptable risk w/o additional ischemic testing required.  I will forward to pharmacy for anticoagulation recs.  Murray Hodgkins, NP 03/09/2017, 4:45 PM

## 2017-03-12 ENCOUNTER — Ambulatory Visit
Admission: RE | Admit: 2017-03-12 | Discharge: 2017-03-12 | Disposition: A | Discharge status: Home / Self Care | Payer: Medicare Other | Source: Ambulatory Visit | Attending: Surgery | Admitting: Surgery

## 2017-03-12 DIAGNOSIS — M4804 Spinal stenosis, thoracic region: Secondary | ICD-10-CM | POA: Diagnosis not present

## 2017-03-12 DIAGNOSIS — R918 Other nonspecific abnormal finding of lung field: Secondary | ICD-10-CM | POA: Diagnosis not present

## 2017-03-12 DIAGNOSIS — D126 Benign neoplasm of colon, unspecified: Secondary | ICD-10-CM

## 2017-03-12 NOTE — Telephone Encounter (Signed)
Patient with diagnosis of A Fib on Xarelto for anticoagulation.    Procedure: colon polyps removal Date of procedure: TBD  CHADS2-VASc score of 3 (HTN, AGE >75)   No hx of stroke/TIA/VTE noted  CrCl = 67 ml/min Platelet count = 266  Per office protocol, patient can hold xarelto for 2 days prior to procedure.  Will not need bridging with Lovenox (enoxaparin) around procedure. Patient should restart Xarelto on the evening of procedure or day after, at discretion of procedure MD.  Harrington Challenger PharmD, BCPS, [place] [address] [place] [address] 03/12/2017 10:23 AM

## 2017-03-12 NOTE — Telephone Encounter (Signed)
    Chart reviewed as part of pre-operative protocol coverage. Clearance already addressed by colleagues. To summarize: Ignacia Bayley, NP reviewed chart - "Previously evaluated in clinic in October for pending procedure and felt to be acceptable risk w/o additional ischemic testing required."  Per pharmacist Raquel Rodriguez-Guzman, "Per office protocol, patient can hold xarelto for 2 days prior to procedure.  Will not need bridging with Lovenox (enoxaparin) around procedure. Patient should restart Xarelto on the evening of procedure or day after, at discretion of procedure MD."  Will route this bundled recommendation to requesting provider via Epic fax function. Please call with questions.  Charlie Pitter, PA-C  03/12/2017, 3:30 PM

## 2017-03-19 ENCOUNTER — Other Ambulatory Visit: Payer: Medicare Other

## 2017-03-20 ENCOUNTER — Other Ambulatory Visit: Payer: Self-pay | Admitting: Urology

## 2017-03-30 NOTE — Pre-Procedure Instructions (Addendum)
Arthur Obrien  03/30/2017      [facility] - [address], [place], [place] - [address] [address] Phone: [phone] Fax: [phone]    Your procedure is scheduled on Monday, April 09, 2017  Report to [facility] Admitting Entrance "A" at 7:15AM   Call this number if you have problems the morning of surgery:  [phone]   Remember:  Do not eat food or drink liquids after midnight.  Take these medicines the morning of surgery with A SIP OF WATER: Diltiazem (TIAZAC) and Pantoprazole (PROTONIX). If needed Diltiazem (CARDIZEM) for palpitations, Acetaminophen (TYLENOL) for pain,  and Hypromellose (ARTIFICIAL TEARS OP) for dry eyes. All Bowel Prep medicines need to be completed by 7AM The Day of Surgery, per Dr. Dema Severin.  Please drink plenty of clear liquids on the day of your bowel prep to prevent dehydration. Please complete your PRE-SURGERY ENSURE that was given to before you leave your house the morning of surgery.  Please, if able, drink it in one setting. DO NOT SIP.  Follow your doctor's instruction regarding Xarelto.  As of today, stop taking all Aspirins, Vitamins, Fish oils, and Herbal medications. Also stop all NSAIDS i.e. Advil, Ibuprofen, Motrin, Aleve, Anaprox, Naproxen, BC and Goody Powders.   Do not wear jewelry.  Do not wear lotions, powders, colognes, or deodorant.  Do not shave 48 hours prior to surgery.  Men may shave face and neck.  Do not bring valuables to the hospital.  [facility] is not responsible for any belongings or valuables.  Contacts, dentures or bridgework may not be worn into surgery.  Leave your suitcase in the car.  After surgery it may be brought to your room.  For patients admitted to the hospital, discharge time will be determined by your treatment team.  Patients discharged the day of surgery will not be allowed to drive home.   Special instructions:   [place]- Preparing For  Surgery  Before surgery, you can play an important role. Because skin is not sterile, your skin needs to be as free of germs as possible. You can reduce the number of germs on your skin by washing with CHG (chlorahexidine gluconate) Soap before surgery.  CHG is an antiseptic cleaner which kills germs and bonds with the skin to continue killing germs even after washing.  Please do not use if you have an allergy to CHG or antibacterial soaps. If your skin becomes reddened/irritated stop using the CHG.  Do not shave (including legs and underarms) for at least 48 hours prior to first CHG shower. It is OK to shave your face.  Please follow these instructions carefully.   1. Shower the NIGHT BEFORE SURGERY and the MORNING OF SURGERY with CHG.   2. If you chose to wash your hair, wash your hair first as usual with your normal shampoo.  3. After you shampoo, rinse your hair and body thoroughly to remove the shampoo.  4. Use CHG as you would any other liquid soap. You can apply CHG directly to the skin and wash gently with a scrungie or a clean washcloth.   5. Apply the CHG Soap to your body ONLY FROM THE NECK DOWN.  Do not use on open wounds or open sores. Avoid contact with your eyes, ears, mouth and genitals (private parts). Wash Face and genitals (private parts)  with your normal soap.  6. Wash thoroughly, paying special attention to the area  where your surgery will be performed.  7. Thoroughly rinse your body with warm water from the neck down.  8. DO NOT shower/wash with your normal soap after using and rinsing off the CHG Soap.  9. Pat yourself dry with a CLEAN TOWEL.  10. Wear CLEAN PAJAMAS to bed the night before surgery, wear comfortable clothes the morning of surgery  11. Place CLEAN SHEETS on your bed the night of your first shower and DO NOT SLEEP WITH PETS.  Day of Surgery: Do not apply any deodorants/lotions. Please wear clean clothes to the hospital/surgery center.    Please  read over the following fact sheets that you were given. Pain Booklet, Coughing and Deep Breathing and Surgical Site Infection Prevention

## 2017-04-02 ENCOUNTER — Other Ambulatory Visit: Payer: Self-pay

## 2017-04-02 ENCOUNTER — Encounter (HOSPITAL_COMMUNITY): Payer: Self-pay

## 2017-04-02 ENCOUNTER — Encounter (HOSPITAL_COMMUNITY)
Admission: RE | Admit: 2017-04-02 | Discharge: 2017-04-02 | Disposition: A | Discharge status: Home / Self Care | Payer: Medicare Other | Source: Ambulatory Visit | Attending: Surgery | Admitting: Surgery

## 2017-04-02 DIAGNOSIS — I10 Essential (primary) hypertension: Secondary | ICD-10-CM | POA: Insufficient documentation

## 2017-04-02 DIAGNOSIS — I4891 Unspecified atrial fibrillation: Secondary | ICD-10-CM | POA: Diagnosis not present

## 2017-04-02 DIAGNOSIS — Z01818 Encounter for other preprocedural examination: Secondary | ICD-10-CM | POA: Insufficient documentation

## 2017-04-02 DIAGNOSIS — Z85048 Personal history of other malignant neoplasm of rectum, rectosigmoid junction, and anus: Secondary | ICD-10-CM | POA: Insufficient documentation

## 2017-04-02 DIAGNOSIS — E785 Hyperlipidemia, unspecified: Secondary | ICD-10-CM | POA: Diagnosis not present

## 2017-04-02 DIAGNOSIS — Z79899 Other long term (current) drug therapy: Secondary | ICD-10-CM | POA: Insufficient documentation

## 2017-04-02 DIAGNOSIS — K219 Gastro-esophageal reflux disease without esophagitis: Secondary | ICD-10-CM | POA: Insufficient documentation

## 2017-04-02 HISTORY — DX: Polyp of colon: K63.5

## 2017-04-02 LAB — COMPREHENSIVE METABOLIC PANEL
ALBUMIN: 3.3 g/dL — AB (ref 3.5–5.0)
ALK PHOS: 134 U/L — AB (ref 38–126)
ALT: 64 U/L — AB (ref 17–63)
ANION GAP: 7 (ref 5–15)
AST: 44 U/L — ABNORMAL HIGH (ref 15–41)
BILIRUBIN TOTAL: 0.6 mg/dL (ref 0.3–1.2)
BUN: 17 mg/dL (ref 6–20)
CALCIUM: 8.9 mg/dL (ref 8.9–10.3)
CO2: 25 mmol/L (ref 22–32)
CREATININE: 0.95 mg/dL (ref 0.61–1.24)
Chloride: 109 mmol/L (ref 101–111)
GFR calc Af Amer: >60 mL/min (ref >60)
GFR calc non Af Amer: >60 mL/min (ref >60)
Glucose, Bld: 100 mg/dL — ABNORMAL HIGH (ref 65–99)
Potassium: 4.2 mmol/L (ref 3.5–5.1)
SODIUM: 141 mmol/L (ref 135–145)
TOTAL PROTEIN: 5.8 g/dL — AB (ref 6.5–8.1)

## 2017-04-02 LAB — CBC WITH DIFFERENTIAL/PLATELET
BASOS PCT: 0 %
Basophils Absolute: 0 10*3/uL (ref 0.0–0.1)
EOS ABS: 0.1 10*3/uL (ref 0.0–0.7)
Eosinophils Relative: 1 %
HEMATOCRIT: 36.7 % — AB (ref 39.0–52.0)
HEMOGLOBIN: 11.7 g/dL — AB (ref 13.0–17.0)
LYMPHS ABS: 1.3 10*3/uL (ref 0.7–4.0)
Lymphocytes Relative: 21 %
MCH: 28.6 pg (ref 26.0–34.0)
MCHC: 31.9 g/dL (ref 30.0–36.0)
MCV: 89.7 fL (ref 78.0–100.0)
Monocytes Absolute: 0.6 10*3/uL (ref 0.1–1.0)
Monocytes Relative: 9 %
NEUTROS ABS: 4.2 10*3/uL (ref 1.7–7.7)
NEUTROS PCT: 69 %
Platelets: 221 10*3/uL (ref 150–400)
RBC: 4.09 MIL/uL — AB (ref 4.22–5.81)
RDW: 14.3 % (ref 11.5–15.5)
WBC: 6.1 10*3/uL (ref 4.0–10.5)

## 2017-04-02 NOTE — Progress Notes (Signed)
PCP - Dr. Rory Percy  Cardiologist - Dr. Virl Axe  Chest x-ray - Denies  EKG - 02/15/17 (E)  Stress Test - Denies  ECHO - 06/26/12 (E)  Cardiac Cath - Denies  Sleep Study - Yes-Negative CPAP - None  Chart will be given for anesthesia review due to surgical order request.  Pt denies having chest pain, sob, or fever at this time. All instructions explained to the pt, with a verbal understanding of the material. Pt agrees to go over the instructions while at home for a better understanding. The opportunity to ask questions was provided.

## 2017-04-03 ENCOUNTER — Encounter (HOSPITAL_COMMUNITY): Payer: Self-pay | Admitting: Emergency Medicine

## 2017-04-03 NOTE — Progress Notes (Signed)
Anesthesia Chart Review:  Pt is a 76 year old male scheduled for laparoscopic vs open completion colectomy with ileostomy, ileostomy, cystoscopy/stents on 04/09/2017 with Nadeen Landau, MD and Franchot Gallo, MD  - PCP is Rory Percy, MD - Cardiologist is Virl Axe, MD. Last office visit 02/15/17 with Tommye Standard, PA.  Pt cleared for surgery by Melina Copa, PA 03/12/17  PMH includes:  PAF, HTN, hyperlipidemia, rectal cancer, GERD. Never smoker. BMI 25  Medications include: Diltiazem, lisinopril, Flagyl, Protonix, xarelto.  Last dose xarelto 04/03/17.   BP (!) 147/52   Pulse 64   Temp 36.7 C   Resp 18   Ht 5\' 10"  (1.778 m)   Wt 173 lb 4.8 oz (78.6 kg)   SpO2 100%   BMI 24.87 kg/m   Preoperative labs reviewed.   - PT/INR will be obtained day of surgery  EKG 02/15/17: Sinus bradycardia (54 bpm)  Echo 06/26/12:  1. EF 55-60%. Global wall motion and contractility are within normal limits. 2. LA mildly to moderately dilated. 3. RV global systolic function normal.  4. No PFO 5. Mild aortic leaflet calcification. No evidence aortic regurgitation. 6. Mild mitral regurgitation. 7. No evidence of tricuspid valve regurgitation. 8. RV systolic pressure calculated at 37 mmHg.  9. Evidence of mild pulmonary hypertension. 10. No evidence pulmonic valve thickening. 11. No pericardial effusion. 12. Inferior vena cava appears normal in size. > 50% respiratory change in inferior vena cava dimensions  If no changes, I anticipate pt can proceed with surgery as scheduled.   Willeen Cass, FNP-BC [facility]/Anesthesiology Phone: [phone] 04/03/2017 11:55 AM

## 2017-04-08 MED ORDER — ALVIMOPAN 12 MG PO CAPS: 12.0000 mg | ORAL_CAPSULE | ORAL | Status: DC

## 2017-04-08 MED ORDER — CEFOTETAN DISODIUM-DEXTROSE 2-2.08 GM-%(50ML) IV SOLR
2.0000 g | INTRAVENOUS | Status: DC
Start: 2017-04-09 — End: 2017-04-08

## 2017-04-08 MED ORDER — HEPARIN SODIUM (PORCINE) 5000 UNIT/ML IJ SOLN: 5000.0000 [IU] | INTRAMUSCULAR | Status: DC

## 2017-04-09 ENCOUNTER — Encounter (HOSPITAL_COMMUNITY): Admission: RE | Payer: Self-pay | Source: Ambulatory Visit

## 2017-04-09 ENCOUNTER — Inpatient Hospital Stay (HOSPITAL_COMMUNITY): Admission: RE | Admit: 2017-04-09 | Payer: Medicare Other | Source: Ambulatory Visit | Admitting: Surgery

## 2017-04-09 SURGERY — COLECTOMY, SIGMOID, LAPAROSCOPIC
Anesthesia: General

## 2017-04-11 ENCOUNTER — Other Ambulatory Visit: Payer: Self-pay | Admitting: Internal Medicine

## 2017-04-12 ENCOUNTER — Other Ambulatory Visit: Payer: Self-pay | Admitting: Urology

## 2017-04-30 ENCOUNTER — Encounter: Payer: Self-pay | Admitting: Genetic Counselor

## 2017-04-30 ENCOUNTER — Other Ambulatory Visit: Payer: Medicare Other

## 2017-04-30 ENCOUNTER — Ambulatory Visit (HOSPITAL_BASED_OUTPATIENT_CLINIC_OR_DEPARTMENT_OTHER): Payer: Medicare Other | Admitting: Genetic Counselor

## 2017-04-30 DIAGNOSIS — Z809 Family history of malignant neoplasm, unspecified: Secondary | ICD-10-CM

## 2017-04-30 DIAGNOSIS — D126 Benign neoplasm of colon, unspecified: Secondary | ICD-10-CM

## 2017-04-30 DIAGNOSIS — C20 Malignant neoplasm of rectum: Secondary | ICD-10-CM

## 2017-04-30 DIAGNOSIS — Z315 Encounter for genetic counseling: Secondary | ICD-10-CM | POA: Diagnosis not present

## 2017-04-30 DIAGNOSIS — Z86010 Personal history of colonic polyps: Secondary | ICD-10-CM | POA: Diagnosis not present

## 2017-04-30 DIAGNOSIS — Z803 Family history of malignant neoplasm of breast: Secondary | ICD-10-CM | POA: Diagnosis not present

## 2017-04-30 DIAGNOSIS — Z808 Family history of malignant neoplasm of other organs or systems: Secondary | ICD-10-CM

## 2017-04-30 DIAGNOSIS — Z85048 Personal history of other malignant neoplasm of rectum, rectosigmoid junction, and anus: Secondary | ICD-10-CM

## 2017-04-30 DIAGNOSIS — K635 Polyp of colon: Secondary | ICD-10-CM | POA: Insufficient documentation

## 2017-04-30 NOTE — Progress Notes (Signed)
REFERRING PROVIDER: Ileana Roup, MD [place], [place] [address]  PRIMARY PROVIDER:  Rory Percy, MD  PRIMARY REASON FOR VISIT:  1. Rectal cancer ([place])   2. Family history of breast cancer   3. Adenomatous polyp of colon, unspecified part of colon      HISTORY OF PRESENT ILLNESS:   Arthur Obrien, a 76 y.o. male, was seen for a [place] cancer genetics consultation at the request of Dr. Dema Severin due to a personal and family history of cancer.  Arthur Obrien presents to clinic today to discuss the possibility of a hereditary predisposition to cancer, genetic testing, and to further clarify his future cancer risks, as well as potential cancer risks for family members.   In 1991, at the age of 68, Arthur Obrien was diagnosed with rectal cancer. This was treated with chemotherapy, surgery and radiation.  He has had several colonoscopies which have found over 10 lifetime polyps, some looking "carpeted".  Some are so numerous that he is planning surgery in January to remove a portion of his colon.      CANCER HISTORY:   No history exists.       Past Medical History:  Diagnosis Date  . Abdominal pain, epigastric 12/14/2016  . Colon cancer ([place]) 1992   rectal stage 3  . Colon polyps   . Colon polyps   . Family history of breast cancer   . GERD (gastroesophageal reflux disease)   . HTN (hypertension)   . Hx of echocardiogram    a.  Echo (6/14): EF 55-60%, mild-mod LAE, normal RV function, mild MR, RVSP 37 mmHg, mild pulmonary hypertension    . Hypercholesterolemia 02/08/2012  . PAF (paroxysmal atrial fibrillation) ([place]) 09/26/2012  . Rectal cancer ([place]) 02/08/2012   Stage III cancer of the rectum with resection earlier in 1992, followed by combination chemotherapy with 5-FU, leucovorin and levamisole in conjunction with radiation therapy to the pelvis by Dr. Nila Nephew, and he has had no evidence of recurrence thus far. His last colonoscopy was by Dr. Benson Norway in November 2010 and for his next one, he would like to see Dr. Hildred Laser.  Dr. Posey Rea  . Sinus bradycardia 09/26/2012    Past Surgical History:  Procedure Laterality Date  . BIOPSY  01/10/2017   Procedure: BIOPSY;  Surgeon: Rogene Houston, MD;  [facility];  Service: Endoscopy;;  polyps @ body and fundus  . CATARACT EXTRACTION     right(years ago) left 06/2010  . COLON SURGERY    . COLONOSCOPY N/A 01/10/2017   Procedure: COLONOSCOPY;  Surgeon: Rogene Houston, MD;  Location: [facility];  Service: Endoscopy;  Laterality: N/A;  . COLONOSCOPY WITH PROPOFOL N/A 02/28/2017   Procedure: COLONOSCOPY WITH PROPOFOL WITH TATTOO;  Surgeon: Ileana Roup, MD;  Location: [facility];  Service: General;  Laterality: N/A;  . ESOPHAGOGASTRODUODENOSCOPY N/A 01/10/2017   Procedure: ESOPHAGOGASTRODUODENOSCOPY (EGD);  Surgeon: Rogene Houston, MD;  [facility];  Service: Endoscopy;  Laterality: N/A;  12:45-rescheduled to 9/12 @ 2:00pm per Lelon Frohlich  . EYE SURGERY    . POLYPECTOMY  01/10/2017   Procedure: POLYPECTOMY;  Surgeon: Rogene Houston, MD;  [facility];  Service: Endoscopy;;  ascending colon, transverse colon, and splenic flexure  . TONSILLECTOMY      Social History   Socioeconomic History  . Marital status: Married    Spouse name: Not on file  . Number of children: Not on  file  . Years of education: Not on file  . Highest education level: Not on file  Social Needs  . Financial resource strain: Not on file  . Food insecurity - worry: Not on file  . Food insecurity - inability: Not on file  . Transportation needs - medical: Not on file  . Transportation needs - non-medical: Not on file  Occupational History  . Not on file  Tobacco Use  . Smoking status: Never Smoker  . Smokeless tobacco: Never Used  Substance and Sexual Activity  . Alcohol use: No  . Drug use: No  . Sexual activity: No  Other Topics Concern  . Not on file  Social  History Narrative  . Not on file     FAMILY HISTORY:  We obtained a detailed, 4-generation family history.  Significant diagnoses are listed below: Family History  Problem Relation Age of Onset  . Hypertension Mother   . Congestive Heart Failure Mother   . Cancer Father        multiple myeloma  . Breast cancer Daughter 70       died at 68, reportedly tested neg for breast cancer genes in 2008  . Cancer Paternal Aunt        NOS  . Heart attack Neg Hx   . Stroke Neg Hx     The patient has three children, two sons and a daughter.  His daughter developed breast cancer at 67 and died at 21.  She reportedly tested negative for hereditary breast cancer genes in 2008.  The patient is an only child.  Both parents are deceased.  His mother died of CHF at 38.  She had two brothers who were estranged and no information is known about them.  Maternal grandparents are both deceased from unknown reasons.  The patient's father died at 47 from multiple myeloma.  He had many siblings, including one sister who had an unknown cancer.  The patient feels that there are many people on his father's side with cancer, but is not sure what kind they had, who they were, or how old they were.  Both paternal grandparents are deceased.  Patient's maternal ancestors are of Caucasian descent, and paternal ancestors are of Caucasian descent. There is no reported Ashkenazi Jewish ancestry. There is no known consanguinity.  GENETIC COUNSELING ASSESSMENT: Arthur Obrien is a 76 y.o. male with a personal and family history of cancer which is somewhat suggestive of a hereditary cancer syndrome and predisposition to cancer. We, therefore, discussed and recommended the following at today's visit.   DISCUSSION: We discussed that about 5-6% of colon cancer and about 5-10% of breast cancer is hereditary.  The most common cause of hereditary colon cancer is due to Lynch syndrome, and is associated with many different cancers,  including more recently, a recognized increased risk for breast cancer.  However, it is not typically thought that breast cancer due to Lynch syndrome is necessarily an early onset such as at 25.  Due to some of the patient's polyps having a carpeted appearance, we are also concerned about the risk for an APC mutation causing familial adenomatous polyposis.    Colon cancer is not typically part of most breast cancer syndromes, however, CHEK2 mutations do increase the risk for colon cancer and therefore we should consider some of these genes.  While BRCA mutations are the most common cause of hereditary breast cancer, colon cancer is not a cancer that we think of as being a  part of this syndrome.  However, literature does indicate that some families have had colon and other GI cancers such as stomach cancer, when a BRCA mutation is present.   We reviewed the characteristics, features and inheritance patterns of hereditary cancer syndromes. We also discussed genetic testing, including the appropriate family members to test, the process of testing, insurance coverage and turn-around-time for results. We discussed the implications of a negative, positive and/or variant of uncertain significant result. We recommended Arthur Obrien pursue genetic testing for the Multi-cancer gene panel. The Multi-Gene Panel offered by [facility] includes sequencing and/or deletion duplication testing of the following 83 genes: ALK, APC, ATM, AXIN2,BAP1,  BARD1, BLM, BMPR1A, BRCA1, BRCA2, BRIP1, CASR, CDC73, CDH1, CDK4, CDKN1B, CDKN1C, CDKN2A (p14ARF), CDKN2A (p16INK4a), CEBPA, CHEK2, CTNNA1, DICER1, DIS3L2, EGFR (c.2369C>T, p.Thr790Met variant only), EPCAM (Deletion/duplication testing only), FH, FLCN, GATA2, GPC3, GREM1 (Promoter region deletion/duplication testing only), HOXB13 (c.251G>A, p.Gly84Glu), HRAS, KIT, MAX, MEN1, MET, MITF (c.952G>A, p.Glu318Lys variant only), MLH1, MSH2, MSH3, MSH6, MUTYH, NBN, NF1, NF2, NTHL1, PALB2, PDGFRA,  PHOX2B, PMS2, POLD1, POLE, POT1, PRKAR1A, PTCH1, PTEN, RAD50, RAD51C, RAD51D, RB1, RECQL4, RET, RUNX1, SDHAF2, SDHA (sequence changes only), SDHB, SDHC, SDHD, SMAD4, SMARCA4, SMARCB1, SMARCE1, STK11, SUFU, TERT, TERT, TMEM127, TP53, TSC1, TSC2, VHL, WRN and WT1.    Based on Arthur Obrien's personal and family history of cancer, he meets medical criteria for genetic testing. Despite that he meets criteria, he may still have an out of pocket cost. We discussed that if his out of pocket cost for testing is over $100, the laboratory will call and confirm whether he wants to proceed with testing.  If the out of pocket cost of testing is less than $100 he will be billed by the genetic testing laboratory.   PLAN: After considering the risks, benefits, and limitations, Arthur Obrien  provided informed consent to pursue genetic testing and the blood sample was sent to [facility] for analysis of the Multi-cancer. Results should be available within approximately 2-3 weeks' time, at which point they will be disclosed by telephone to Arthur Obrien, as will any additional recommendations warranted by these results. Arthur Obrien will receive a summary of his genetic counseling visit and a copy of his results once available. This information will also be available in Epic. We encouraged Arthur Obrien to remain in contact with cancer genetics annually so that we can continuously update the family history and inform him of any changes in cancer genetics and testing that may be of benefit for his family. Arthur Obrien questions were answered to his satisfaction today. Our contact information was provided should additional questions or concerns arise.  Lastly, we encouraged Arthur Obrien to remain in contact with cancer genetics annually so that we can continuously update the family history and inform him of any changes in cancer genetics and testing that may be of benefit for this family.   Arthur Obrien questions were  answered to his satisfaction today. Our contact information was provided should additional questions or concerns arise. Thank you for the referral and allowing [place] to share in the care of your patient.   Arthur Obrien P. Florene [place], [place], [facility] Certified Genetic Counselor [email or web address] phone: [phone]  The patient was seen for a total of 60 minutes in face-to-face genetic counseling.  This patient was discussed with Drs. Magrinat, Lindi Adie and/or Burr Medico who agrees with the above.    _______________________________________________________________________ For Office Staff:  Number of people involved in session: 2 Was an Intern/ student involved with case: no

## 2017-05-07 NOTE — Patient Instructions (Signed)
Arthur Obrien  05/07/2017   Your procedure is scheduled on: 05-11-17  Report to [facility] Main  Entrance     Follow signs to Short Stay on first floor at 530 AM    Call this number if you have problems the morning of surgery [phone]     Remember: Do not eat food After Midnight ON Wednesday 05-09-17. DRINK PLENTY OF CLEAR LIQUIDS ALL DAY Thursday 05-10-17 AND FOLLOW AL BOWEL PREP ORDERS PROVIDED BY YOUR SURGEON. NOTHING BY MOUTH AFTER MIDNIGHT ON Thursday !!     Take these medicines the morning of surgery with A SIP OF WATER: DILTIAZEM, TYLENOL IF NEEDED                                You may not have any metal on your body including hair pins and              piercings  Do not wear jewelry, make-up, lotions, powders or perfumes, deodorant             Do not wear nail polish.  Do not shave  48 hours prior to surgery.              Men may shave face and neck.   Do not bring valuables to the hospital. [place].  Contacts, dentures or bridgework may not be worn into surgery.  Leave suitcase in the car. After surgery it may be brought to your room.               Please read over the following fact sheets you were given: _____________________________________________________________________   CLEAR LIQUID DIET   Foods Allowed                                                                     Foods Excluded  Coffee and tea, regular and decaf                             liquids that you cannot  Plain Jell-O in any flavor                                             see through such as: Fruit ices (not with fruit pulp)                                     milk, soups, orange juice  Iced Popsicles                                    All solid food Carbonated beverages, regular and diet  Cranberry, grape and apple juices Sports drinks like Gatorade Lightly seasoned clear  broth or consume(fat free) Sugar, honey syrup  Sample Menu Breakfast                                Lunch                                     Supper Cranberry juice                    Beef broth                            Chicken broth Jell-O                                     Grape juice                           Apple juice Coffee or tea                        Jell-O                                      Popsicle                                                Coffee or tea                        Coffee or tea  _____________________________________________________________________    COLON BOWEL PREP  Please follow the instructions carefully. It is important to clean out your bowels & take the prescribed antibiotic pills to lower your chances of a wound infection or abscess.   FIVE DAYS PRIOR TO YOUR SURGERY Stop eating any nuts, popcorn, or fruit with seeds. Stop all fiber supplements such as Metamucil, Citrucel, etc.   Hold taking any blood thinning anticoagulation medication (ex: aspirin, warfarin/Coumadin, Plavix, Xarelto, Eliquis, Pradaxa, etc) as recommended by your medical/cardiology doctor  Obtain what you need at a pharmacy of your choice: -Filled out prescriptions for your oral antibiotics (Neomycin & Metronidazole)  -A bottle of MiraLax / Glycolax (288g) - no prescription required  -A large bottle of Gatorade / Powerade (64oz)  -Dulcolax tablets (4 tabs) - no prescription required   DAY PRIOR TO SURGERY   7:00am Swallow 4 Dulcolax tablets with some water Drink plenty of clear liquids all day to avoid getting dehydrated (Water, juice, soda, coffee, tea, bouillon, jello, etc.)  10:00am Mix the bottle of MiraLax with the 64-oz bottle of Gatorade.  Drink the Gatorade mixture gradually over the next few hours (8oz glass every 15-30 minutes) until gone. You should finish by 2pm.  2:00pm Take 2 Neomycin 500mg  tablets & 2 Metronidazole 500mg  tablets  3:00pm Take 2 Neomycin  500mg  tablets & 2 Metronidazole 500mg  tablets  Drink plenty of clear liquids all evening to avoid getting dehydrated  10:00pm Take 2 Neomycin 500mg  tablets & 2 Metronidazole 500mg  tablets  Do not eat or drink anything after bedtime (midnight) the night before your surgery.   MORNING OF SURGERY Remember to not to drink or eat anything that morning  Hold or take medications as recommended by the hospital staff at your Preoperative visit  If you have questions or concerns, please call [facility] [phone] during business hours to speak to the clinical staff for advice.   [facility] - Preparing for Surgery Before surgery, you can play an important role.  Because skin is not sterile, your skin needs to be as free of germs as possible.  You can reduce the number of germs on your skin by washing with CHG (chlorahexidine gluconate) soap before surgery.  CHG is an antiseptic cleaner which kills germs and bonds with the skin to continue killing germs even after washing. Please DO NOT use if you have an allergy to CHG or antibacterial soaps.  If your skin becomes reddened/irritated stop using the CHG and inform your nurse when you arrive at Short Stay. Do not shave (including legs and underarms) for at least 48 hours prior to the first CHG shower.  You may shave your face/neck. Please follow these instructions carefully:  1.  Shower with CHG Soap the night before surgery and the  morning of Surgery.  2.  If you choose to wash your hair, wash your hair first as usual with your  normal  shampoo.  3.  After you shampoo, rinse your hair and body thoroughly to remove the  shampoo.                           4.  Use CHG as you would any other liquid soap.  You can apply chg directly  to the skin and wash                       Gently with a scrungie or clean washcloth.  5.  Apply the CHG Soap to your body ONLY FROM THE NECK DOWN.   Do not use on face/ open                           Wound or  open sores. Avoid contact with eyes, ears mouth and genitals (private parts).                       Wash face,  Genitals (private parts) with your normal soap.             6.  Wash thoroughly, paying special attention to the area where your surgery  will be performed.  7.  Thoroughly rinse your body with warm water from the neck down.  8.  DO NOT shower/wash with your normal soap after using and rinsing off  the CHG Soap.                9.  Pat yourself dry with a clean towel.            10.  Wear clean pajamas.            11.  Place clean sheets on your bed the night of your first shower and do not  sleep with pets. Day of Surgery : Do not apply any lotions/deodorants the morning of surgery.  Please wear clean clothes  to the hospital/surgery center.  FAILURE TO FOLLOW THESE INSTRUCTIONS MAY RESULT IN THE CANCELLATION OF YOUR SURGERY PATIENT SIGNATURE_________________________________  NURSE SIGNATURE__________________________________  ________________________________________________________________________   Adam Phenix  An incentive spirometer is a tool that can help keep your lungs clear and active. This tool measures how well you are filling your lungs with each breath. Taking long deep breaths may help reverse or decrease the chance of developing breathing (pulmonary) problems (especially infection) following:  A long period of time when you are unable to move or be active. BEFORE THE PROCEDURE   If the spirometer includes an indicator to show your best effort, your nurse or respiratory therapist will set it to a desired goal.  If possible, sit up straight or lean slightly forward. Try not to slouch.  Hold the incentive spirometer in an upright position. INSTRUCTIONS FOR USE  1. Sit on the edge of your bed if possible, or sit up as far as you can in bed or on a chair. 2. Hold the incentive spirometer in an upright position. 3. Breathe out normally. 4. Place the  mouthpiece in your mouth and seal your lips tightly around it. 5. Breathe in slowly and as deeply as possible, raising the piston or the ball toward the top of the column. 6. Hold your breath for 3-5 seconds or for as long as possible. Allow the piston or ball to fall to the bottom of the column. 7. Remove the mouthpiece from your mouth and breathe out normally. 8. Rest for a few seconds and repeat Steps 1 through 7 at least 10 times every 1-2 hours when you are awake. Take your time and take a few normal breaths between deep breaths. 9. The spirometer may include an indicator to show your best effort. Use the indicator as a goal to work toward during each repetition. 10. After each set of 10 deep breaths, practice coughing to be sure your lungs are clear. If you have an incision (the cut made at the time of surgery), support your incision when coughing by placing a pillow or rolled up towels firmly against it. Once you are able to get out of bed, walk around indoors and cough well. You may stop using the incentive spirometer when instructed by your caregiver.  RISKS AND COMPLICATIONS  Take your time so you do not get dizzy or light-headed.  If you are in pain, you may need to take or ask for pain medication before doing incentive spirometry. It is harder to take a deep breath if you are having pain. AFTER USE  Rest and breathe slowly and easily.  It can be helpful to keep track of a log of your progress. Your caregiver can provide you with a simple table to help with this. If you are using the spirometer at home, follow these instructions: [place] IF:   You are having difficultly using the spirometer.  You have trouble using the spirometer as often as instructed.  Your pain medication is not giving enough relief while using the spirometer.  You develop fever of 100.5 F (38.1 C) or higher. SEEK IMMEDIATE MEDICAL CARE IF:   You cough up bloody sputum that had not been present  before.  You develop fever of 102 F (38.9 C) or greater.  You develop worsening pain at or near the incision site. MAKE SURE YOU:   Understand these instructions.  Will watch your condition.  Will get help right away if you are not doing well or  get worse. Document Released: 08/28/2006 Document Revised: 07/10/2011 Document Reviewed: 10/29/2006 [facility] Patient Information 2014 [place], [place].   ________________________________________________________________________

## 2017-05-07 NOTE — Progress Notes (Signed)
CT CHEST 03-12-17 Epic   EKG 02-15-17 Epic  LOV CARDIOLOGY URSUY PA-C 02-15-17 Epic

## 2017-05-08 ENCOUNTER — Encounter (HOSPITAL_COMMUNITY): Payer: Self-pay

## 2017-05-08 ENCOUNTER — Encounter (HOSPITAL_COMMUNITY)
Admission: RE | Admit: 2017-05-08 | Discharge: 2017-05-08 | Disposition: A | Discharge status: Home / Self Care | Payer: Medicare Other | Source: Ambulatory Visit | Attending: Surgery | Admitting: Surgery

## 2017-05-08 ENCOUNTER — Other Ambulatory Visit: Payer: Self-pay

## 2017-05-08 DIAGNOSIS — I10 Essential (primary) hypertension: Secondary | ICD-10-CM | POA: Insufficient documentation

## 2017-05-08 DIAGNOSIS — Z01812 Encounter for preprocedural laboratory examination: Secondary | ICD-10-CM

## 2017-05-08 DIAGNOSIS — I4891 Unspecified atrial fibrillation: Secondary | ICD-10-CM

## 2017-05-08 DIAGNOSIS — K635 Polyp of colon: Secondary | ICD-10-CM

## 2017-05-08 LAB — CBC
HCT: 36.1 % — ABNORMAL LOW (ref 39.0–52.0)
HEMOGLOBIN: 11.8 g/dL — AB (ref 13.0–17.0)
MCH: 29.4 pg (ref 26.0–34.0)
MCHC: 32.7 g/dL (ref 30.0–36.0)
MCV: 89.8 fL (ref 78.0–100.0)
Platelets: 282 10*3/uL (ref 150–400)
RBC: 4.02 MIL/uL — AB (ref 4.22–5.81)
RDW: 14 % (ref 11.5–15.5)
WBC: 7.3 10*3/uL (ref 4.0–10.5)

## 2017-05-08 LAB — BASIC METABOLIC PANEL
Anion gap: 6 (ref 5–15)
BUN: 16 mg/dL (ref 6–20)
CALCIUM: 8.6 mg/dL — AB (ref 8.9–10.3)
CHLORIDE: 111 mmol/L (ref 101–111)
CO2: 26 mmol/L (ref 22–32)
CREATININE: 0.96 mg/dL (ref 0.61–1.24)
GFR calc Af Amer: >60 mL/min (ref >60)
GLUCOSE: 113 mg/dL — AB (ref 65–99)
POTASSIUM: 4.1 mmol/L (ref 3.5–5.1)
Sodium: 143 mmol/L (ref 135–145)

## 2017-05-08 LAB — PROTIME-INR
INR: 0.97
Prothrombin Time: 12.8 seconds (ref 11.4–15.2)

## 2017-05-08 LAB — ABO/RH: ABO/RH(D): A POS

## 2017-05-08 NOTE — Progress Notes (Signed)
SPOKE WITH ANESTHESIA DR Suzette Battiest TO FULFILL SURGEON ORDER FOR ANESTHESIA CONSULT. INFORMED OF AFIB HISTORY AND XARELTO MANAGEMENT. PER FITZ[place], OKAY TO PROCEED ; NO FURTHER RECOMMENDATIONS

## 2017-05-10 NOTE — Anesthesia Preprocedure Evaluation (Addendum)
Anesthesia Evaluation  Patient identified by MRN, date of birth, ID band Patient awake    Reviewed: Allergy & Precautions, NPO status , Patient's Chart, lab work & pertinent test results  Airway Mallampati: II  TM Distance: >3 FB Neck ROM: Full    Dental  (+) Teeth Intact, Dental Advisory Given   Pulmonary neg pulmonary ROS,    Pulmonary exam normal breath sounds clear to auscultation       Cardiovascular hypertension, Pt. on medications Normal cardiovascular exam+ dysrhythmias (PAF)  Rhythm:Regular Rate:Normal     Neuro/Psych negative neurological ROS  negative psych ROS   GI/Hepatic Neg liver ROS, GERD  Medicated,Rectal cancer   Endo/Other  negative endocrine ROS  Renal/GU negative Renal ROS     Musculoskeletal negative musculoskeletal ROS (+)   Abdominal   Peds  Hematology  (+) Blood dyscrasia (Xarelto), anemia ,   Anesthesia Other Findings Day of surgery medications reviewed with the patient.  Reproductive/Obstetrics                            Anesthesia Physical Anesthesia Plan  ASA: III  Anesthesia Plan: General   Post-op Pain Management:    Induction: Intravenous  PONV Risk Score and Plan: 3 and Dexamethasone, Ondansetron and Treatment may vary due to age or medical condition  Airway Management Planned: Oral ETT  Additional Equipment:   Intra-op Plan:   Post-operative Plan: Extubation in OR  Informed Consent: I have reviewed the patients History and Physical, chart, labs and discussed the procedure including the risks, benefits and alternatives for the proposed anesthesia with the patient or authorized representative who has indicated his/her understanding and acceptance.   Dental advisory given  Plan Discussed with: CRNA  Anesthesia Plan Comments: (Risks/benefits of general anesthesia discussed with patient including risk of damage to teeth, lips, gum, and tongue,  nausea/vomiting, allergic reactions to medications, and the possibility of heart attack, stroke and death.  All patient questions answered.  Patient wishes to proceed.)        Anesthesia Quick Evaluation

## 2017-05-11 ENCOUNTER — Inpatient Hospital Stay (HOSPITAL_COMMUNITY)
Admission: RE | Admit: 2017-05-11 | Discharge: 2017-05-18 | DRG: 330 | Disposition: A | Discharge status: Home Health | Payer: Medicare Other | Source: Ambulatory Visit | Attending: Surgery | Admitting: Surgery

## 2017-05-11 ENCOUNTER — Other Ambulatory Visit: Payer: Self-pay

## 2017-05-11 ENCOUNTER — Inpatient Hospital Stay (HOSPITAL_COMMUNITY): Payer: Medicare Other | Admitting: Anesthesiology

## 2017-05-11 ENCOUNTER — Inpatient Hospital Stay (HOSPITAL_COMMUNITY): Payer: Medicare Other

## 2017-05-11 ENCOUNTER — Encounter (HOSPITAL_COMMUNITY)
Admission: RE | Disposition: A | Discharge status: Home Health | Payer: Self-pay | Source: Ambulatory Visit | Attending: Surgery

## 2017-05-11 ENCOUNTER — Encounter (HOSPITAL_COMMUNITY): Payer: Self-pay | Admitting: *Deleted

## 2017-05-11 DIAGNOSIS — K565 Intestinal adhesions [bands], unspecified as to partial versus complete obstruction: Secondary | ICD-10-CM | POA: Diagnosis present

## 2017-05-11 DIAGNOSIS — E876 Hypokalemia: Secondary | ICD-10-CM | POA: Diagnosis not present

## 2017-05-11 DIAGNOSIS — Z85048 Personal history of other malignant neoplasm of rectum, rectosigmoid junction, and anus: Secondary | ICD-10-CM | POA: Diagnosis not present

## 2017-05-11 DIAGNOSIS — E7800 Pure hypercholesterolemia, unspecified: Secondary | ICD-10-CM | POA: Diagnosis present

## 2017-05-11 DIAGNOSIS — E785 Hyperlipidemia, unspecified: Secondary | ICD-10-CM | POA: Diagnosis not present

## 2017-05-11 DIAGNOSIS — C20 Malignant neoplasm of rectum: Secondary | ICD-10-CM | POA: Diagnosis present

## 2017-05-11 DIAGNOSIS — Z9221 Personal history of antineoplastic chemotherapy: Secondary | ICD-10-CM | POA: Diagnosis not present

## 2017-05-11 DIAGNOSIS — D122 Benign neoplasm of ascending colon: Secondary | ICD-10-CM | POA: Diagnosis not present

## 2017-05-11 DIAGNOSIS — K529 Noninfective gastroenteritis and colitis, unspecified: Secondary | ICD-10-CM | POA: Diagnosis not present

## 2017-05-11 DIAGNOSIS — Z807 Family history of other malignant neoplasms of lymphoid, hematopoietic and related tissues: Secondary | ICD-10-CM | POA: Diagnosis not present

## 2017-05-11 DIAGNOSIS — K219 Gastro-esophageal reflux disease without esophagitis: Secondary | ICD-10-CM | POA: Diagnosis present

## 2017-05-11 DIAGNOSIS — Z9049 Acquired absence of other specified parts of digestive tract: Secondary | ICD-10-CM

## 2017-05-11 DIAGNOSIS — I480 Paroxysmal atrial fibrillation: Secondary | ICD-10-CM | POA: Diagnosis present

## 2017-05-11 DIAGNOSIS — K5289 Other specified noninfective gastroenteritis and colitis: Secondary | ICD-10-CM | POA: Diagnosis not present

## 2017-05-11 DIAGNOSIS — Y842 Radiological procedure and radiotherapy as the cause of abnormal reaction of the patient, or of later complication, without mention of misadventure at the time of the procedure: Secondary | ICD-10-CM | POA: Diagnosis present

## 2017-05-11 DIAGNOSIS — D123 Benign neoplasm of transverse colon: Principal | ICD-10-CM | POA: Diagnosis present

## 2017-05-11 DIAGNOSIS — D126 Benign neoplasm of colon, unspecified: Secondary | ICD-10-CM | POA: Diagnosis not present

## 2017-05-11 DIAGNOSIS — Z7901 Long term (current) use of anticoagulants: Secondary | ICD-10-CM | POA: Diagnosis not present

## 2017-05-11 DIAGNOSIS — R140 Abdominal distension (gaseous): Secondary | ICD-10-CM | POA: Diagnosis not present

## 2017-05-11 DIAGNOSIS — I10 Essential (primary) hypertension: Secondary | ICD-10-CM | POA: Diagnosis present

## 2017-05-11 DIAGNOSIS — K635 Polyp of colon: Secondary | ICD-10-CM | POA: Diagnosis not present

## 2017-05-11 DIAGNOSIS — Z923 Personal history of irradiation: Secondary | ICD-10-CM

## 2017-05-11 DIAGNOSIS — I4891 Unspecified atrial fibrillation: Secondary | ICD-10-CM | POA: Diagnosis present

## 2017-05-11 DIAGNOSIS — Z418 Encounter for other procedures for purposes other than remedying health state: Secondary | ICD-10-CM | POA: Diagnosis not present

## 2017-05-11 DIAGNOSIS — Z419 Encounter for procedure for purposes other than remedying health state, unspecified: Secondary | ICD-10-CM | POA: Diagnosis not present

## 2017-05-11 DIAGNOSIS — K660 Peritoneal adhesions (postprocedural) (postinfection): Secondary | ICD-10-CM | POA: Diagnosis present

## 2017-05-11 DIAGNOSIS — D124 Benign neoplasm of descending colon: Secondary | ICD-10-CM | POA: Diagnosis not present

## 2017-05-11 HISTORY — PX: COLECTOMY: SHX59

## 2017-05-11 HISTORY — PX: CYSTOSCOPY WITH STENT PLACEMENT: SHX5790

## 2017-05-11 HISTORY — PX: ILEOSTOMY: SHX1783

## 2017-05-11 LAB — TYPE AND SCREEN
ABO/RH(D): A POS
ANTIBODY SCREEN: NEGATIVE

## 2017-05-11 LAB — CBC
HEMATOCRIT: 35.4 % — AB (ref 39.0–52.0)
Hemoglobin: 11.5 g/dL — ABNORMAL LOW (ref 13.0–17.0)
MCH: 29.5 pg (ref 26.0–34.0)
MCHC: 32.5 g/dL (ref 30.0–36.0)
MCV: 90.8 fL (ref 78.0–100.0)
Platelets: 270 10*3/uL (ref 150–400)
RBC: 3.9 MIL/uL — AB (ref 4.22–5.81)
RDW: 14.4 % (ref 11.5–15.5)
WBC: 14 10*3/uL — AB (ref 4.0–10.5)

## 2017-05-11 LAB — CREATININE, SERUM
Creatinine, Ser: 1.54 mg/dL — ABNORMAL HIGH (ref 0.61–1.24)
GFR calc non Af Amer: 41 mL/min — ABNORMAL LOW (ref >60)
GFR, EST AFRICAN AMERICAN: 48 mL/min — AB (ref >60)

## 2017-05-11 SURGERY — COLECTOMY, TOTAL
Anesthesia: General | Site: Ureter

## 2017-05-11 MED ORDER — CHLORHEXIDINE GLUCONATE CLOTH 2 % EX PADS: 6.0000 | MEDICATED_PAD | Freq: Once | CUTANEOUS | Status: DC

## 2017-05-11 MED ORDER — PANTOPRAZOLE SODIUM 40 MG PO TBEC
40.0000 mg | DELAYED_RELEASE_TABLET | Freq: Two times a day (BID) | ORAL | Status: DC
  Administered 2017-05-12 – 2017-05-18 (×13): 40 mg via ORAL
  Filled 2017-05-11 (×13): qty 1

## 2017-05-11 MED ORDER — DIPHENHYDRAMINE HCL 50 MG/ML IJ SOLN: 12.5000 mg | Freq: Four times a day (QID) | INTRAMUSCULAR | Status: DC | PRN

## 2017-05-11 MED ORDER — ONDANSETRON HCL 4 MG/2ML IJ SOLN: 4.0000 mg | Freq: Once | INTRAMUSCULAR | Status: DC | PRN

## 2017-05-11 MED ORDER — LACTATED RINGERS IV SOLN
INTRAVENOUS | Status: DC
  Administered 2017-05-14 – 2017-05-16 (×4): via INTRAVENOUS

## 2017-05-11 MED ORDER — SODIUM CHLORIDE 0.9 % IJ SOLN
INTRAMUSCULAR | Status: AC
  Filled 2017-05-11: qty 50

## 2017-05-11 MED ORDER — ALBUMIN HUMAN 5 % IV SOLN
INTRAVENOUS | Status: DC | PRN
  Administered 2017-05-11: 09:00:00 via INTRAVENOUS

## 2017-05-11 MED ORDER — DILTIAZEM HCL ER COATED BEADS 120 MG PO CP24
120.0000 mg | ORAL_CAPSULE | Freq: Every day | ORAL | Status: DC
  Administered 2017-05-12 – 2017-05-13 (×2): 120 mg via ORAL
  Filled 2017-05-11 (×4): qty 1

## 2017-05-11 MED ORDER — ROCURONIUM BROMIDE 10 MG/ML (PF) SYRINGE
PREFILLED_SYRINGE | INTRAVENOUS | Status: DC | PRN
  Administered 2017-05-11: 20 mg via INTRAVENOUS
  Administered 2017-05-11: 10 mg via INTRAVENOUS
  Administered 2017-05-11: 5 mg via INTRAVENOUS
  Administered 2017-05-11: 50 mg via INTRAVENOUS
  Administered 2017-05-11: 10 mg via INTRAVENOUS

## 2017-05-11 MED ORDER — FENTANYL CITRATE (PF) 100 MCG/2ML IJ SOLN
INTRAMUSCULAR | Status: AC
  Filled 2017-05-11: qty 2

## 2017-05-11 MED ORDER — SUGAMMADEX SODIUM 500 MG/5ML IV SOLN
INTRAVENOUS | Status: DC | PRN
  Administered 2017-05-11: 312 mg via INTRAVENOUS

## 2017-05-11 MED ORDER — HEPARIN SODIUM (PORCINE) 5000 UNIT/ML IJ SOLN
5000.0000 [IU] | Freq: Three times a day (TID) | INTRAMUSCULAR | Status: DC
  Administered 2017-05-12 – 2017-05-15 (×10): 5000 [IU] via SUBCUTANEOUS
  Filled 2017-05-11 (×10): qty 1

## 2017-05-11 MED ORDER — CEFOTETAN DISODIUM-DEXTROSE 2-2.08 GM-%(50ML) IV SOLR
INTRAVENOUS | Status: AC
  Filled 2017-05-11: qty 50

## 2017-05-11 MED ORDER — SODIUM CHLORIDE 0.9% FLUSH: 9.0000 mL | INTRAVENOUS | Status: DC | PRN

## 2017-05-11 MED ORDER — DEXTROSE 5 % IV SOLN
INTRAVENOUS | Status: DC | PRN
  Administered 2017-05-11: 2 g via INTRAVENOUS

## 2017-05-11 MED ORDER — BUPIVACAINE LIPOSOME 1.3 % IJ SUSP
20.0000 mL | Freq: Once | INTRAMUSCULAR | Status: AC
  Administered 2017-05-11: 20 mL
  Filled 2017-05-11: qty 20

## 2017-05-11 MED ORDER — SODIUM CHLORIDE 0.9 % IJ SOLN
INTRAMUSCULAR | Status: DC | PRN
  Administered 2017-05-11: 15 mL

## 2017-05-11 MED ORDER — ESMOLOL HCL 100 MG/10ML IV SOLN
INTRAVENOUS | Status: DC | PRN
Start: 2017-05-11 — End: 2017-05-11
  Administered 2017-05-11 (×5): 10 mg via INTRAVENOUS

## 2017-05-11 MED ORDER — ONDANSETRON HCL 4 MG PO TABS: 4.0000 mg | ORAL_TABLET | Freq: Four times a day (QID) | ORAL | Status: DC | PRN

## 2017-05-11 MED ORDER — FENTANYL CITRATE (PF) 100 MCG/2ML IJ SOLN
INTRAMUSCULAR | Status: DC | PRN
  Administered 2017-05-11 (×4): 50 ug via INTRAVENOUS

## 2017-05-11 MED ORDER — ACETAMINOPHEN 325 MG PO TABS
650.0000 mg | ORAL_TABLET | Freq: Four times a day (QID) | ORAL | Status: DC
  Administered 2017-05-12 – 2017-05-18 (×24): 650 mg via ORAL
  Filled 2017-05-11 (×23): qty 2

## 2017-05-11 MED ORDER — KETAMINE HCL 10 MG/ML IJ SOLN
INTRAMUSCULAR | Status: AC
  Filled 2017-05-11: qty 1

## 2017-05-11 MED ORDER — OXYCODONE HCL 5 MG PO TABS: 10.0000 mg | ORAL_TABLET | Freq: Four times a day (QID) | ORAL | Status: DC | PRN

## 2017-05-11 MED ORDER — GABAPENTIN 300 MG PO CAPS
300.0000 mg | ORAL_CAPSULE | ORAL | Status: AC
  Administered 2017-05-11: 300 mg via ORAL
  Filled 2017-05-11: qty 1

## 2017-05-11 MED ORDER — KETAMINE HCL 10 MG/ML IJ SOLN
INTRAMUSCULAR | Status: DC | PRN
  Administered 2017-05-11 (×2): 10 mg via INTRAVENOUS
  Administered 2017-05-11: 20 mg via INTRAVENOUS
  Administered 2017-05-11: 10 mg via INTRAVENOUS

## 2017-05-11 MED ORDER — ACETAMINOPHEN 500 MG PO TABS
1000.0000 mg | ORAL_TABLET | ORAL | Status: AC
  Administered 2017-05-11: 1000 mg via ORAL
  Filled 2017-05-11: qty 2

## 2017-05-11 MED ORDER — HYDROMORPHONE 1 MG/ML IV SOLN
INTRAVENOUS | Status: DC
  Administered 2017-05-11: 0.9 mg via INTRAVENOUS
  Administered 2017-05-11: 1 mL via INTRAVENOUS
  Administered 2017-05-12: 04:00:00 via INTRAVENOUS
  Administered 2017-05-12: 0.9 mg via INTRAVENOUS
  Administered 2017-05-12: 1.2 mg via INTRAVENOUS
  Administered 2017-05-12 (×2): 0.6 mg via INTRAVENOUS
  Administered 2017-05-13: 0.3 mg via INTRAVENOUS
  Administered 2017-05-13: via INTRAVENOUS
  Administered 2017-05-13: 0.3 mg via INTRAVENOUS
  Filled 2017-05-11: qty 25

## 2017-05-11 MED ORDER — 0.9 % SODIUM CHLORIDE (POUR BTL) OPTIME
TOPICAL | Status: DC | PRN
  Administered 2017-05-11: 4000 mL

## 2017-05-11 MED ORDER — ONDANSETRON HCL 4 MG/2ML IJ SOLN: 4.0000 mg | Freq: Four times a day (QID) | INTRAMUSCULAR | Status: DC | PRN

## 2017-05-11 MED ORDER — NALOXONE HCL 0.4 MG/ML IJ SOLN: 0.4000 mg | INTRAMUSCULAR | Status: DC | PRN

## 2017-05-11 MED ORDER — PROPOFOL 10 MG/ML IV BOLUS
INTRAVENOUS | Status: AC
  Filled 2017-05-11: qty 40

## 2017-05-11 MED ORDER — ALBUMIN HUMAN 5 % IV SOLN
INTRAVENOUS | Status: AC
  Filled 2017-05-11: qty 250

## 2017-05-11 MED ORDER — ALUM & MAG HYDROXIDE-SIMETH 200-200-20 MG/5ML PO SUSP: 30.0000 mL | Freq: Four times a day (QID) | ORAL | Status: DC | PRN

## 2017-05-11 MED ORDER — PROPOFOL 10 MG/ML IV BOLUS
INTRAVENOUS | Status: DC | PRN
  Administered 2017-05-11: 150 mg via INTRAVENOUS

## 2017-05-11 MED ORDER — DEXAMETHASONE SODIUM PHOSPHATE 10 MG/ML IJ SOLN
INTRAMUSCULAR | Status: AC
  Filled 2017-05-11: qty 1

## 2017-05-11 MED ORDER — LIDOCAINE 2% (20 MG/ML) 5 ML SYRINGE
INTRAMUSCULAR | Status: DC | PRN
  Administered 2017-05-11: 50 mg via INTRAVENOUS

## 2017-05-11 MED ORDER — DIPHENHYDRAMINE HCL 12.5 MG/5ML PO ELIX: 12.5000 mg | ORAL_SOLUTION | Freq: Four times a day (QID) | ORAL | Status: DC | PRN

## 2017-05-11 MED ORDER — EPHEDRINE 5 MG/ML INJ
INTRAVENOUS | Status: AC
  Filled 2017-05-11: qty 20

## 2017-05-11 MED ORDER — LACTATED RINGERS IV SOLN
INTRAVENOUS | Status: DC | PRN
  Administered 2017-05-11 (×2): via INTRAVENOUS

## 2017-05-11 MED ORDER — DEXAMETHASONE SODIUM PHOSPHATE 10 MG/ML IJ SOLN
INTRAMUSCULAR | Status: DC | PRN
  Administered 2017-05-11: 10 mg via INTRAVENOUS

## 2017-05-11 MED ORDER — LISINOPRIL 5 MG PO TABS
5.0000 mg | ORAL_TABLET | Freq: Every day | ORAL | Status: DC
  Administered 2017-05-12 – 2017-05-18 (×7): 5 mg via ORAL
  Filled 2017-05-11: qty 2
  Filled 2017-05-11 (×3): qty 1
  Filled 2017-05-11: qty 2
  Filled 2017-05-11: qty 1
  Filled 2017-05-11: qty 2
  Filled 2017-05-11: qty 1

## 2017-05-11 MED ORDER — FENTANYL CITRATE (PF) 100 MCG/2ML IJ SOLN: 25.0000 ug | INTRAMUSCULAR | Status: DC | PRN

## 2017-05-11 MED ORDER — ESMOLOL HCL 100 MG/10ML IV SOLN
INTRAVENOUS | Status: AC
  Filled 2017-05-11: qty 20

## 2017-05-11 MED ORDER — EPHEDRINE SULFATE-NACL 50-0.9 MG/10ML-% IV SOSY
PREFILLED_SYRINGE | INTRAVENOUS | Status: DC | PRN
  Administered 2017-05-11 (×5): 5 mg via INTRAVENOUS

## 2017-05-11 MED ORDER — ONDANSETRON HCL 4 MG/2ML IJ SOLN
INTRAMUSCULAR | Status: AC
  Filled 2017-05-11: qty 2

## 2017-05-11 MED ORDER — ONDANSETRON HCL 4 MG/2ML IJ SOLN
INTRAMUSCULAR | Status: DC | PRN
  Administered 2017-05-11 (×2): 4 mg via INTRAVENOUS

## 2017-05-11 MED ORDER — BUPIVACAINE-EPINEPHRINE (PF) 0.5% -1:200000 IJ SOLN
INTRAMUSCULAR | Status: AC
  Filled 2017-05-11: qty 30

## 2017-05-11 MED ORDER — LIDOCAINE HCL 2 % IJ SOLN
INTRAMUSCULAR | Status: AC
  Filled 2017-05-11: qty 20

## 2017-05-11 MED ORDER — LACTATED RINGERS IV SOLN
INTRAVENOUS | Status: DC | PRN
  Administered 2017-05-11: 06:00:00 via INTRAVENOUS

## 2017-05-11 MED ORDER — BUPIVACAINE-EPINEPHRINE (PF) 0.5% -1:200000 IJ SOLN
INTRAMUSCULAR | Status: DC | PRN
  Administered 2017-05-11: 15 mL

## 2017-05-11 MED ORDER — LIDOCAINE 2% (20 MG/ML) 5 ML SYRINGE
INTRAMUSCULAR | Status: DC | PRN
  Administered 2017-05-11: 1 mg/kg/h via INTRAVENOUS

## 2017-05-11 SURGICAL SUPPLY — 91 items
ADAPTER GOLDBERG URETERAL (ADAPTER) ×2 IMPLANT
ADPR CATH 15X14FR FL DRN BG (ADAPTER) ×3
APPLIER CLIP 5 13 M/L LIGAMAX5 (MISCELLANEOUS)
APPLIER CLIP ROT 10 11.4 M/L (STAPLE)
APR CLP MED LRG 11.4X10 (STAPLE)
APR CLP MED LRG 5 ANG JAW (MISCELLANEOUS)
BAG URO CATCHER STRL LF (MISCELLANEOUS) ×5 IMPLANT
BLADE EXTENDED COATED 6.5IN (ELECTRODE) ×2 IMPLANT
CABLE HIGH FREQUENCY MONO STRZ (ELECTRODE) ×3 IMPLANT
CATH INTERMIT  6FR 70CM (CATHETERS) ×4 IMPLANT
CATH MUSHROOM 28FR (CATHETERS) IMPLANT
CATH MUSHROOM 30FR (CATHETERS) IMPLANT
CELLS DAT CNTRL 66122 CELL SVR (MISCELLANEOUS) IMPLANT
CLIP APPLIE 5 13 M/L LIGAMAX5 (MISCELLANEOUS) IMPLANT
CLIP APPLIE ROT 10 11.4 M/L (STAPLE) IMPLANT
CLOTH BEACON ORANGE TIMEOUT ST (SAFETY) ×5 IMPLANT
COVER FOOTSWITCH UNIV (MISCELLANEOUS) IMPLANT
COVER SURGICAL LIGHT HANDLE (MISCELLANEOUS) ×7 IMPLANT
DECANTER SPIKE VIAL GLASS SM (MISCELLANEOUS) ×5 IMPLANT
DISSECTOR BLUNT TIP ENDO 5MM (MISCELLANEOUS) IMPLANT
DRAIN CHANNEL 19F RND (DRAIN) ×2 IMPLANT
DRAPE SURG IRRIG POUCH 19X23 (DRAPES) ×5 IMPLANT
DRSG OPSITE POSTOP 4X10 (GAUZE/BANDAGES/DRESSINGS) IMPLANT
DRSG OPSITE POSTOP 4X12 (GAUZE/BANDAGES/DRESSINGS) ×2 IMPLANT
DRSG OPSITE POSTOP 4X6 (GAUZE/BANDAGES/DRESSINGS) IMPLANT
DRSG OPSITE POSTOP 4X8 (GAUZE/BANDAGES/DRESSINGS) IMPLANT
ELECT REM PT RETURN 15FT ADLT (MISCELLANEOUS) ×5 IMPLANT
EVACUATOR SILICONE 100CC (DRAIN) ×2 IMPLANT
GAUZE SPONGE 4X4 12PLY STRL (GAUZE/BANDAGES/DRESSINGS) IMPLANT
GLOVE BIO SURGEON STRL SZ7.5 (GLOVE) ×10 IMPLANT
GLOVE BIOGEL M 8.0 STRL (GLOVE) ×5 IMPLANT
GLOVE INDICATOR 8.0 STRL GRN (GLOVE) ×10 IMPLANT
GOWN STRL REUS W/ TWL XL LVL3 (GOWN DISPOSABLE) ×3 IMPLANT
GOWN STRL REUS W/TWL LRG LVL3 (GOWN DISPOSABLE) ×5 IMPLANT
GOWN STRL REUS W/TWL XL LVL3 (GOWN DISPOSABLE) ×33 IMPLANT
GUIDEWIRE ANG ZIPWIRE 038X150 (WIRE) IMPLANT
GUIDEWIRE STR DUAL SENSOR (WIRE) ×5 IMPLANT
HOLDER FOLEY CATH W/STRAP (MISCELLANEOUS) ×5 IMPLANT
LIGASURE IMPACT 36 18CM CVD LR (INSTRUMENTS) ×2 IMPLANT
MANIFOLD NEPTUNE II (INSTRUMENTS) ×5 IMPLANT
PACK COLON (CUSTOM PROCEDURE TRAY) ×5 IMPLANT
PACK CYSTO (CUSTOM PROCEDURE TRAY) ×5 IMPLANT
PAD POSITIONING PINK XL (MISCELLANEOUS) ×5 IMPLANT
PORT LAP GEL ALEXIS MED 5-9CM (MISCELLANEOUS) IMPLANT
POUCH OSTOMY FLEX CONVEX 2-1/8 (OSTOMY) ×2 IMPLANT
RELOAD PROXIMATE 75MM BLUE (ENDOMECHANICALS) ×20 IMPLANT
RELOAD STAPLE 75 3.8 BLU REG (ENDOMECHANICALS) IMPLANT
RETRACTOR WND ALEXIS 18 MED (MISCELLANEOUS) IMPLANT
RTRCTR WOUND ALEXIS 18CM MED (MISCELLANEOUS)
SCISSORS LAP 5X35 DISP (ENDOMECHANICALS) ×3 IMPLANT
SEALER TISSUE G2 STRG ARTC 35C (ENDOMECHANICALS) ×5 IMPLANT
SET IRRIG TUBING LAPAROSCOPIC (IRRIGATION / IRRIGATOR) ×3 IMPLANT
SHEARS HARMONIC ACE PLUS 36CM (ENDOMECHANICALS) IMPLANT
SLEEVE ADV FIXATION 5X100MM (TROCAR) ×6 IMPLANT
SLEEVE SURGEON STRL (DRAPES) ×2 IMPLANT
SPONGE DRAIN TRACH 4X4 STRL 2S (GAUZE/BANDAGES/DRESSINGS) ×2 IMPLANT
SPONGE LAP 18X18 X RAY DECT (DISPOSABLE) ×6 IMPLANT
STAPLER 90 3.5 STAND SLIM (STAPLE) ×5
STAPLER 90 3.5 STD SLIM (STAPLE) IMPLANT
STAPLER CUT CVD 40MM BLUE (STAPLE) ×2 IMPLANT
STAPLER CUT RELOAD GREEN (STAPLE) ×2 IMPLANT
STAPLER PROXIMATE 75MM BLUE (STAPLE) ×2 IMPLANT
STAPLER VISISTAT 35W (STAPLE) IMPLANT
SUT ETHILON 2 0 PS N (SUTURE) ×2 IMPLANT
SUT ETHILON 3 0 PS 1 (SUTURE) ×2 IMPLANT
SUT PDS AB 1 CTX 36 (SUTURE) IMPLANT
SUT PDS AB 1 TP1 96 (SUTURE) ×4 IMPLANT
SUT PROLENE 2 0 KS (SUTURE) ×5 IMPLANT
SUT PROLENE 2 0 SH DA (SUTURE) ×5 IMPLANT
SUT SILK 2 0 (SUTURE) ×5
SUT SILK 2 0 SH CR/8 (SUTURE) ×5 IMPLANT
SUT SILK 2-0 18XBRD TIE 12 (SUTURE) ×3 IMPLANT
SUT SILK 3 0 (SUTURE) ×5
SUT SILK 3 0 SH CR/8 (SUTURE) ×7 IMPLANT
SUT SILK 3-0 18XBRD TIE 12 (SUTURE) ×3 IMPLANT
SUT VIC AB 3-0 SH 18 (SUTURE) ×6 IMPLANT
SUT VICRYL 2 0 18  UND BR (SUTURE) ×2
SUT VICRYL 2 0 18 UND BR (SUTURE) ×3 IMPLANT
SYS LAPSCP GELPORT 120MM (MISCELLANEOUS)
SYSTEM LAPSCP GELPORT 120MM (MISCELLANEOUS) IMPLANT
TAPE CLOTH SURG 6X10 WHT LF (GAUZE/BANDAGES/DRESSINGS) ×2 IMPLANT
TOWEL OR 17X26 10 PK STRL BLUE (TOWEL DISPOSABLE) IMPLANT
TOWEL OR NON WOVEN STRL DISP B (DISPOSABLE) ×5 IMPLANT
TRAY FOLEY BAG SILVER LF 14FR (CATHETERS) IMPLANT
TRAY FOLEY W/METER SILVER 16FR (SET/KITS/TRAYS/PACK) ×2 IMPLANT
TRAY IRRIG W/60CC SYR STRL (SET/KITS/TRAYS/PACK) ×3 IMPLANT
TROCAR ADV FIXATION 5X100MM (TROCAR) ×5 IMPLANT
TROCAR XCEL BLUNT TIP 100MML (ENDOMECHANICALS) ×3 IMPLANT
TUBING CONNECTING 10 (TUBING) ×5 IMPLANT
TUBING CONNECTING 10' (TUBING) ×2
TUBING INSUF HEATED (TUBING) ×3 IMPLANT

## 2017-05-11 NOTE — H&P (Signed)
HPI: Here for surgery  hx of HTN, HLD, AFib on Xarelto - referred to me by Dr. Redmond Pulling for surgical management of endoscopically unresectable colon polyps.  He has a history of rectal cancer and underwent a LAR with colorectal anastomosis in 1992 followed by adjuvant chemoradiation therapy. He has been told he was cured of this and in remission. He underwent a high-risk screening colonoscopy 01/10/17 with Dr. Laural Golden. There are multiple tubular adenomas found in the colon on the colonoscopy - polyps are found in the ascending colon, hepatic flexure, transverse colon and splenic flexure. By documentation there is a 1-2cm polyp in the proximal transverse colon which was carpet-like and incompletely removed - this was not tattooed; clips were placed. An additional greater than 5cm polyp was found in the splenic flexure and partially removed, this one reportedly was tattoo'd. He saw Dr. Redmond Pulling in the office. He subsequently had a CT enterography for intermittent bloating and diarrhea which showed at least a partial small bowel obstruction with a potential transition point. Since that test, however, he states he has been feeling well and tolerating a diet, no nausea or vomiting and having daily bowel movements - in fact has some degree of ongoing loose stool - occasionally takes lomotil. No distention today.  He reports 1-2 episodes per week where he has incontinence of stool particularly when the stool is liquid and has an accident in his underwear. He does not wear diapers. He occasionally changes his daily activities if he is concerned that his stools became more liquid because of fear of incontinence.  I repeated his colonoscopy last week and ensured marking of both large partially resected polyps. Additional polyps noted which were small and left in situ as the other larger polyps were endoscopically unretrievable and he would need colectomy to address.  PMH: Afib (rate controlled on diltiazem;  also takes Xarelto); HTN  PSH: Rectal CA s/p LAR 1992 + postop chemoXRT  FHx: Denies FHx of malignancy.    Allergies No Known Drug Allergies 02/01/2017 Allergies Reconciled   Medication History DilTIAZem HCl ER Beads (120MG  Capsule ER 24HR, Oral) Active. Lisinopril (5MG  Tablet, Oral) Active. Xarelto (20MG  Tablet, Oral) Active. Pantoprazole Sodium (40MG  Tablet DR, Oral) Active. IBU (800MG  Tablet, Oral) Active. Cyanocobalamin (1000MCG Tablet, Oral) Active. Lomotil (2.5-0.025MG  Tablet, Oral) Active. Simethicone (125MG  Capsule, Oral) Active. Medications Reconciled    Review of Systems General Present- Weight Loss. Not Present- Appetite Loss, Chills, Fatigue, Fever, Night Sweats and Weight Gain. Skin Not Present- Change in Wart/Mole, Dryness, Hives, Jaundice, New Lesions, Non-Healing Wounds, Rash and Ulcer. HEENT Present- Hearing Loss and Ringing in the Ears. Not Present- Earache, Hoarseness, Nose Bleed, Oral Ulcers, Seasonal Allergies, Sinus Pain, Sore Throat, Visual Disturbances, Wears glasses/contact lenses and Yellow Eyes. Respiratory Not Present- Bloody sputum, Chronic Cough, Difficulty Breathing, Snoring and Wheezing. Breast Not Present- Breast Mass, Breast Pain, Nipple Discharge and Skin Changes. Cardiovascular Present- Palpitations and Rapid Heart Rate. Not Present- Chest Pain, Difficulty Breathing Lying Down, Leg Cramps, Shortness of Breath and Swelling of Extremities. Gastrointestinal Present- Abdominal Pain, Bloating and Chronic diarrhea. Not Present- Bloody Stool, Change in Bowel Habits, Constipation, Difficulty Swallowing, Excessive gas, Gets full quickly at meals, Hemorrhoids, Indigestion, Nausea, Rectal Pain and Vomiting. Musculoskeletal Not Present- Back Pain, Joint Pain, Joint Stiffness, Muscle Pain, Muscle Weakness and Swelling of Extremities. Neurological Not Present- Decreased Memory, Fainting, Headaches, Numbness, Seizures, Tingling, Tremor,  Trouble walking and Weakness. Psychiatric Not Present- Anxiety, Bipolar, Change in Sleep Pattern, Depression, Fearful and Frequent crying. Endocrine  Not Present- Cold Intolerance, Excessive Hunger, Hair Changes, Heat Intolerance, Hot flashes and New Diabetes. Hematology Present- Blood Thinners. Not Present- Easy Bruising, Excessive bleeding, Gland problems, HIV and Persistent Infections.  Vitals:   05/11/17 0542 05/11/17 0547  BP: 131/65   Pulse: 73   Resp: 18   Temp: 98 F (36.7 C)   TempSrc: Oral   SpO2: 100%   Weight:  78 kg (172 lb)  Height:  5\' 11"  (1.803 m)          Physical Exam Harrell Gave M. Roshawn Ayala MD; 03/05/2017 5:19 PM) The physical exam findings are as follows: Note:Constitutional: No acute distress; conversant; no deformities Eyes: Moist conjunctiva; no lid lag, anicteric pupils; pupils equal round reactive to light Neck: Trachea midline; no thyromegaly Lungs: Normal respiratory effort; no tactile fremitus CV: RRR; no palpable thrills; no pitting edema GI: Abdomen soft, NT; no palpable hepatosplenmegaly MSK: Normal gait; no clubbing/cyanosis Psych: Appropriate affect; alert and oriented x3 Lymphatic: No palpable cervical or axillary lymphadenopathy    Assessment & Plan TUBULOVILLOUS ADENOMA POLYP OF COLON (D12.6) Impression: 19M hx HTN, HLD, AFib on Xarelto, prior rectal cancer s/p partial proctectomy with colorectal anastomosis at 6-7 cm. Referred by Dr. Redmond Pulling for evaluation of multiple colon polyps - incomplete removal of the polyp in proximal transverse colon as well as splenic flexure where there were 'multiple >5cm polyps,' attempted destruction endoscopically with Argon beam. Repeat cscope by myself last week - ensured tattooing. Lesions remain present and carpeting. Additonal multiple polyps throughout colon.  -Plan open completion colectomy with end ileostomy; cysto and stents will be necessary given that he has a mid rectal colorectal  anastomosis and then adjuvant chemoXRT. The ileostomy will be a permenant ileostomy and this was explained to the patient. -Off Xarelto for the past 5 days -The anatomy and physiology of the GI tract was explained using pictures and diagrams. The pathophysiology of polyps and natural course was described. The planned procedure including laparoscopic and open approach was described in detail. The material risks (including, but not limited to, pain, bleeding, infection, scarring, damage to surrounding structures including bowel blood vessels and ureters, need for additional procedures, high stoma output, dehydration, kidney failure, hernia, heart attack, stroke, death were described) benefits and alternatives to surgery were discussed. His and his wife's questions were answered to their satisfaction and they elected to proceed - he has requested a date at the end of this month.            Sharon Mt. Dema Severin, M.D. General and Colorectal Surgery [facility]

## 2017-05-11 NOTE — Anesthesia Procedure Notes (Signed)
Procedure Name: Intubation Date/Time: 05/11/2017 7:39 AM Performed by: Lavina Hamman, CRNA Pre-anesthesia Checklist: Patient identified, Emergency Drugs available, Suction available, Patient being monitored and Timeout performed Patient Re-evaluated:Patient Re-evaluated prior to induction Oxygen Delivery Method: Circle system utilized Preoxygenation: Pre-oxygenation with 100% oxygen Induction Type: IV induction Ventilation: Mask ventilation without difficulty Laryngoscope Size: Mac and 4 Grade View: Grade II Tube type: Oral Tube size: 7.5 mm Number of attempts: 1 Airway Equipment and Method: Stylet Placement Confirmation: ETT inserted through vocal cords under direct vision,  positive ETCO2,  CO2 detector and breath sounds checked- equal and bilateral Secured at: 22 cm Tube secured with: Tape Dental Injury: Teeth and Oropharynx as per pre-operative assessment

## 2017-05-11 NOTE — Op Note (Signed)
05/11/2017  12:20 PM  PATIENT:  Hedda Slade  77 y.o. male  Patient Care Team: Rory Percy, MD as PCP - General (Family Medicine) Deboraha Sprang, MD as Consulting Physician (Clinical Cardiac Electrophysiology)  PRE-OPERATIVE DIAGNOSIS:  COLON POLYPS, multiple, endoscopically unretrievable, history of rectal cancer  POST-OPERATIVE DIAGNOSIS: Same  PROCEDURE:   1.  Total proctocolectomy with end ileostomy 2.  Small bowel resection 3.  Lysis of adhesions - 60 minutes   SURGEON:  Sharon Mt. Dema Severin, MD  ASSISTANT: Michael Boston, MD  ANESTHESIA:   general  COUNTS:  Sponge, needle and instrument counts were reported correct x2 at the conclusion of the operation - there was an additional instrument in the count that wasn't accounted for so an X-ray was performed. No retained foreign bodies were identified on abdominal X-ray at the conclusion of the operation.   EBL: 200 cc  DRAINS: 45 French Blake drain left draining the pelvis  SPECIMEN: 1. En bloc-terminal ileum, colon, previous colorectal anastomosis 2.  Loop of fibrotic/stenotic ileum with apparent transition point  FINDINGS: Loop of small bowel tethered in the pelvis at the site of likely radiation induced damage to the small bowel.  There was a transition point at this level with chronically dilated bowel proximal and decompressed bowel distal.  The segment was not salvageable given the changes seen was therefore resected.  This was approximately 25 cm of ileum.  Completion proctocolectomy, leaving approximately 2cm rectal stump; end ileostomy.  Both ureters were identified and additionally the stents palpated throughout the operation and protected free of injury.  Ureteral stents were removed at the conclusion of the case.  DISPOSITION: PACU in satisfactory condition  INDICATION: Mr. Delahoussaye has a hx of HTN, HLD, AFib on Xarelto - referred to me by Dr. Redmond Pulling for surgical management of endoscopically unresectable colon  polyps.  He has a history of rectal cancer and underwent a LAR with colorectal anastomosis in 1992 followed by adjuvant chemoradiation therapy - this anastomosis was quite low and palpable on digital rectal exam. He has been told he was cured of this and in remission. He underwent a high-risk screening colonoscopy 01/10/17 with Dr. Laural Golden. There were multiple tubular adenomas found in the colon on the colonoscopy - polyps are found in the ascending colon, hepatic flexure, transverse colon and splenic flexure. By documentation there was a 1-2cm polyp in the proximal transverse colon which was carpet-like and incompletely removed - this was not tattooed; clips were placed. An additional greater than 5cm polyp was found in the splenic flexure and partially removed, this one reportedly was tattoo'd. He saw Dr. Redmond Pulling in the office. He subsequently had a CT enterography for intermittent bloating and diarrhea which showed at least a partial small bowel obstruction with a potential transition point. His history was that of intermittent partial small bowel obstruction. At baseline, he has issues with loose stool requiring lomotil and has issues with fecal incontinence.  He reports 1-2 episodes per week where he has incontinence of stool particularly when the stool is liquid and has an accident in his underwear. He does not wear diapers but does changes his daily activities if he is concerned that his stools became more liquid because of fear of incontinence.  I repeated his colonoscopy and ensured marking of both large partially resected polyps. Additional polyps noted which were small and left in situ as the other larger polyps were endoscopically unretrievable and he would need colectomy to address - these polyps were in  the vicinity of the hepatic flexure and proximal descending colon/splenic flexure. Given the fact that he is had a history of rectal cancer and now numerous polyps at least 2 of which are  endoscopically unretrievable and in separate segments of colon total proctocolectomy was therefore recommended.  A segmental resection was felt to be inadequate given the polyp burden, history, and polyp locations which would require a very large segmental resection.  After performing this there would certainly be concerned regarding the blood supply to his remaining neorectum. He has additionally been seen by our genetic counselors and has additional testing pending for purposes of informing his children.  We discussed all this at length in the clinic and given all the above came to the decision that removing his remaining colon, getting below his prior colorectal anastomosis and bringing up an ileostomy would be the best option for him.  We additionally discussed the need for postoperative surveillance of his rectal stump in the future for neoplasia.  The anatomy and physiology of the GI tract was described in detail using pictures/diagrams. The pathophysiology was also detailed. The procedure, material risks (including, but not limited to, pain, bleeding, infection, scarring, dehydration and kidney damage from high ostomy output, damage to surrounding structures - viscus/bladder/ureters/nerves/vessels, need for additional procedures, hernia, heart attack, stroke, death), benefits and alternatives were explained. The patient's questions were answered to their satisfaction and they elected to proceed with surgery.  DESCRIPTION: The patient was identified in preop holding and taken to the OR where they were placed on the operating room table and SCDs were placed. General endotracheal anesthesia was induced without difficulty. The patient was then positioned in low lithotomy with Allen stirrups and pressure points were padded. The patient was then prepped and draped in the usual sterile fashion. A surgical timeout was performed indicating the correct patient, procedure, positioning and need for preoperative  antibiotics.   Urology began by performing cystoscopy and bilateral urine stents.  Please refer to their notes for details regarding this portion of the procedure.  A midline laparotomy was created and carried down through the subcutaneous tissues to the level of the fascia.  Above the level of the prior surgery abdominal entry was gained by incising the fascia, elevating the peritoneum between 2 clamps and entering sharply.  The fascia and underlying peritoneum were then opened.  At the lower portion of the midline incision adhesions between small bowel and abdominal wall were encountered.  These were lysed sharply.  No deserosalization's or enterotomies were created.  This segment was ultimately included in the resected piece of intestine which was adherent to the pelvis and contained radiation damage.  The small bowel appeared chronically dilated as it entered the pelvis and there was a loop of bowel in the pelvis which was stuck to the presacral space which was bluntly freed.  The segment contained a transition point from the chronically dilated proximal bowel to a decompressed distal bowel.  This was the likely transition point of his prior bowel obstructions/intermittent partial obstructions.  This appeared to be consistent with radiation damage.  Attention was then turned to performing the pelvic dissection.  The sigmoid colon was mobilized into the pelvis.  The left ureter was identified as was the right ureter by both palpation of the ureteral stents and direct visualization.  The sigmoid was reflected medially and pelvic dissection commenced.  The neorectum was dissected circumferentially beginning posteriorly first and working laterally and finally anteriorly.  The prior colorectal anastomosis was identified  low in the pelvis just beneath the seminal vesicles.  Once the dissection had commenced caudal to this anastomosis a contoured stapler with a green load was placed and fired.  Prior to firing  the stapler digital inspection of the remaining rectum confirmed adequacy of dissection below the staple line.  Attention was then turned to performing the abdominal portion of the colectomy.  Beginning with the descending colon this was mobilized along the Helma Argyle line of Toldt taken up to the level of the splenic flexure and this was partially taken down.  The greater omentum was then retracted caudad and the transverse colon identified.  Just proximal to the splenic flexure the lesser sac was entered and the plane developed and the remaining attachments of the splenic flexure were taken down.  The greater omentum was then taken off of the transverse colon staying again within the lesser sac and working proximally to level the hepatic flexure.  Once the hepatic flexure was reached attention was then turned to mobilizing the cecum and right colon.  There was a clip at the appendiceal base consistent with a prior appendectomy.  The cecum was then retracted medially and the right colon mobilized along the Adasyn Mcadams line of Toldt.  At the level of the hepatic flexure of the colon was reflected medially and the duodenum was identified and preserved free of injury.  The colon was reflected off the underlying duodenum.  At this point the entire abdominal colon was mobile.  Approximately 5 cm from the ileocecal valve the terminal ileum was divided using a GIA blue load after creating a window in the mesentery. And attention was turned to ligation of the vasculature.  The ileocolic pedicle was circumferentially dissected again confirming we were above the level of the duodenum and this was divided with a LigaSure.  At the right mesocolon was divided again identified in the right colic and staying in a plane fairly high to ensure we had adequate node harvest given his unresectable polyps within his proximal transverse colon.  Coming around the middle calyx were identified and ligated approximately 1 cm distal to takeoff from  the superior mesenteric artery.  At this level care was taken to identify both the superior mesenteric artery, superior mesenteric vein, and inferior border the pancreas.  These were all protected and preserved free of injury.  Coming around to the left colon the left colic artery was identified and ligated again using the LigaSure.  The IMA and previously been divided during his prior proctectomy.  At this point the entire colon was free and passed off the specimen.  The small bowel was run from the ligament of Treitz to the staple line of the terminal ileum.  The previously identified segment of fibrotic/strictured small bowel which also corresponded to the area where adhesions had been lysed was identified.  The segment was not salvageable and was the transition point for his partial small bowel obstructions.  Given all of this the decision was made to resect the segment of bowel.  Windows were created in the mesentery at the 2 healthy ends of ileum.  The bowel was divided with GIA staplers using a blue load proximally distally.  The intervening mesentery was then excised using a LigaSure.  This was passed off the specimen.  Attention was then turned to creating the enteroenterostomy.  Stay sutures of 3-0 silk were placed.  Enterotomies were made on both the proximal and distal ends.  The enteroenterostomy was created using a 82mm GIA  blue load on the antimesenteric border of the small bowel.  The staple line was inspected and noted to be hemostatic.  The common enterotomy was then closed using a TX 90 blue stapler.  Staple line oozing was controlled with 3-0 silk U stitches.  The corners were then imbricated using 3-0 silk sutures.  The mesenteric defect was then closed with 3-0 silk suture.  The abdomen was copiously irrigated and all quadrants were inspected for hemostasis.  The pelvis was then irrigated and hemostasis was again verified.  The rectal staple line was inspected and noted to be intact and  hemostatic.  Attention was turned to fashioning the ileostomy.  The stapled off terminal ileum was identified.  The previously discussed/marked ileostomy site just superior to the umbilicus was identified.  A wheel of skin was then excised as was the underlying subcutaneous tissue down to the level of the anterior rectus sheath.  Kocher clamps were placed to place the fascia under some tension.  The anterior rectus sheath was then incised longitudinally and partially teed off.  The underlying rectus muscle was then spread using retractors and the posterior rectus sheath was identified.  A laparotomy pad was placed under the abdominal wall at the site to protect the underlying bowel.  The posterior sheath was opened. 2 fingers were able to snugly pass through the stoma site.  The stapled end of terminal ileum was then passed through the abdominal wall.  A 19 French round Blake drain was placed through the left lateral abdominal wall and looped within the pelvis.  The greater omentum was then brought down into the pelvis, to fill the pelvis, and hopefully exclude small bowel.  At the conclusion there is no small bowel within the pelvis.  All gown, gloves, and instruments were exchanged for clean ones and the surgical site was covered with clean drapes.  Attention was turned to closing the abdominal fascia.  The midline fascia was then approximated with two #1 looped PDS sutures.  Subcutaneous tissue was then irrigated with sterile saline.  The skin was then closed with staples.  The wound was covered with sterile towels and attention was turned to stoma maturation.  The staple line was excised and the ileostomy was then brooked using 3-0 Vicryl sutures.  The ostomy was pink, patent, and protruded nicely.  The midline wound was covered with a sterile dressing and a stoma appliance was applied.  The ureteral stents were removed. The patient was then awakened from general anesthesia and transferred to a stretcher for  transport to PACU in satisfactory condition    Note: This dictation was prepared with Dragon/digital dictation along with [employer]. Any transcriptional errors that result from this process are unintentional.

## 2017-05-11 NOTE — H&P (Signed)
H&P  Chief Complaint: Colon cancer  History of Present Illness: 77 year old male presents for colectomy by Dr. Dema Severin.  Dr. Dema Severin is requested ureteral stent placement to assist with surgery.  Past Medical History:  Diagnosis Date  . Abdominal pain, epigastric 12/14/2016  . Colon cancer ([place]) 1992   rectal stage 3  . Colon polyps   . Colon polyps   . Family history of breast cancer   . GERD (gastroesophageal reflux disease)   . HTN (hypertension)   . Hx of echocardiogram    a.  Echo (6/14): EF 55-60%, mild-mod LAE, normal RV function, mild MR, RVSP 37 mmHg, mild pulmonary hypertension    . Hypercholesterolemia 02/08/2012  . PAF (paroxysmal atrial fibrillation) ([place]) 09/26/2012  . Rectal cancer ([place]) 02/08/2012   Stage III cancer of the rectum with resection earlier in 1992, followed by combination chemotherapy with 5-FU, leucovorin and levamisole in conjunction with radiation therapy to the pelvis by Dr. Nila Nephew, and he has had no evidence of recurrence thus far. His last colonoscopy was by Dr. Benson Norway in November 2010 and for his next one, he would like to see Dr. Hildred Laser.  Dr. Posey Rea  . Sinus bradycardia 09/26/2012    Past Surgical History:  Procedure Laterality Date  . BIOPSY  01/10/2017   Procedure: BIOPSY;  Surgeon: Rogene Houston, MD;  [facility];  Service: Endoscopy;;  polyps @ body and fundus  . CATARACT EXTRACTION     right(years ago) left 06/2010  . COLON SURGERY    . COLONOSCOPY N/A 01/10/2017   Procedure: COLONOSCOPY;  Surgeon: Rogene Houston, MD;  [facility]: [facility];  Service: Endoscopy;  Laterality: N/A;  . COLONOSCOPY WITH PROPOFOL N/A 02/28/2017   Procedure: COLONOSCOPY WITH PROPOFOL WITH TATTOO;  Surgeon: Ileana Roup, MD;  Location: [facility];  Service: General;  Laterality: N/A;  . ESOPHAGOGASTRODUODENOSCOPY N/A 01/10/2017   Procedure: ESOPHAGOGASTRODUODENOSCOPY (EGD);  Surgeon: Rogene Houston, MD;  [facility];  Service: Endoscopy;  Laterality: N/A;  12:45-rescheduled to 9/12 @ 2:00pm per Lelon Frohlich  . EYE SURGERY    . POLYPECTOMY  01/10/2017   Procedure: POLYPECTOMY;  Surgeon: Rogene Houston, MD;  [facility];  Service: Endoscopy;;  ascending colon, transverse colon, and splenic flexure  . TONSILLECTOMY      Home Medications:    Allergies: No Known Allergies  Family History  Problem Relation Age of Onset  . Hypertension Mother   . Congestive Heart Failure Mother   . Cancer Father        multiple myeloma  . Breast cancer Daughter 54       died at 62, reportedly tested neg for breast cancer genes in 2008  . Cancer Paternal Aunt        NOS  . Heart attack Neg Hx   . Stroke Neg Hx     Social History:  reports that  has never smoked. he has never used smokeless tobacco. He reports that he does not drink alcohol or use drugs.  ROS: A complete review of systems was performed.  All systems are negative except for pertinent findings as noted.  Physical Exam:  Vital signs in last 24 hours: Temp:  [98 F (36.7 C)] 98 F (36.7 C) (01/11 0542) Pulse Rate:  [73] 73 (01/11 0542) Resp:  [18] 18 (01/11 0542) BP: (131)/(65) 131/65 (01/11 0542) SpO2:  [100 %] 100 % (01/11 0542) Weight:  [78 kg (172 lb)] 78  kg (172 lb) (01/11 0547) Constitutional:  Alert and oriented, No acute distress Cardiovascular: Regular rate  Respiratory: Normal respiratory effort Neurologic: Grossly intact, no focal deficits Psychiatric: Normal mood and affect  Laboratory Data:  Recent Labs    05/08/17 0955  WBC 7.3  HGB 11.8*  HCT 36.1*  PLT 282    Recent Labs    05/08/17 0955  NA 143  K 4.1  CL 111  GLUCOSE 113*  BUN 16  CALCIUM 8.6*  CREATININE 0.96     No results found for this or any previous visit (from the past 24 hour(s)). No results found for this or any previous visit (from the past 240 hour(s)).  Renal Function: Recent Labs    05/08/17 0955  CREATININE 0.96    Estimated Creatinine Clearance: 67.5 mL/min (by C-G formula based on SCr of 0.96 mg/dL).  Radiologic Imaging: No results found.  Impression/Assessment:  Colon cancer  Plan:  Cystoscopy, bilateral ureteral catheter placement

## 2017-05-11 NOTE — Transfer of Care (Signed)
Immediate Anesthesia Transfer of Care Note  Patient: JAMIESON HETLAND  Procedure(s) Performed: OPEN COMPLETION COLECTOMY WITH ILEOSTOMY (N/A Abdomen) ILEOSTOMY (N/A ) CYSTOSCOPY WITH BILATERAL STENT PLACEMENT (N/A Ureter)  Patient Location: PACU  Anesthesia Type:General  Level of Consciousness: awake, alert  and oriented  Airway & Oxygen Therapy: Patient Spontanous Breathing and Patient connected to face mask oxygen  Post-op Assessment: Report given to RN  Post vital signs: Reviewed and stable  Last Vitals:  Vitals:   05/11/17 0542 05/11/17 1258  BP: 131/65   Pulse: 73   Resp: 18   Temp: 36.7 C (!) (P) 36.4 C  SpO2: 100%     Last Pain:  Vitals:   05/11/17 0542  TempSrc: Oral      Patients Stated Pain Goal: 4 (62/70/35 0093)  Complications: No apparent anesthesia complications

## 2017-05-11 NOTE — Brief Op Note (Signed)
05/11/2017  12:58 PM  PATIENT:  Hedda Slade  77 y.o. male  PRE-OPERATIVE DIAGNOSIS:  COLON POLYPS  POST-OPERATIVE DIAGNOSIS:  COLON POLYPS  PROCEDURE:  Procedure(s): OPEN COMPLETION COLECTOMY WITH ILEOSTOMY (N/A) ILEOSTOMY (N/A) CYSTOSCOPY WITH BILATERAL STENT PLACEMENT (N/A)  SURGEON:  Surgeon(s) and Role: Panel 1:    * Charmel Pronovost, Sharon Mt, MD - Primary    Michael Boston, MD - Assisting Panel 2:    Franchot Gallo, MD - Primary  PHYSICIAN ASSISTANT:   ASSISTANTS: Michael Boston MD   ANESTHESIA:   general  EBL:  300 mL   BLOOD ADMINISTERED:None  DRAINS: 19Fr blake draining pelvis   LOCAL MEDICATIONS USED:  Marcaine + Exparel  SPECIMEN:  1. TI, colon, colorectal anastomosis all en bloc; 2. Ileal stricture  DISPOSITION OF SPECIMEN:  PATHOLOGY  COUNTS:  1 extra instrument (needle driver) in final count - X-ray was obtained per protocol and no retained foreign bodies were identified by radiology  TOURNIQUET:  * No tourniquets in log *  DICTATION: .Dragon Dictation  PLAN OF CARE: Admit to inpatient   PATIENT DISPOSITION:  PACU - hemodynamically stable.   Delay start of Pharmacological VTE agent (>24hrs) due to surgical blood loss or risk of bleeding: no

## 2017-05-11 NOTE — Anesthesia Postprocedure Evaluation (Signed)
Anesthesia Post Note  Patient: BRODERICK FONSECA  Procedure(s) Performed: OPEN COMPLETION COLECTOMY WITH ILEOSTOMY (N/A Abdomen) ILEOSTOMY (N/A ) CYSTOSCOPY WITH BILATERAL STENT PLACEMENT (N/A Ureter)     Patient location during evaluation: PACU Anesthesia Type: General Level of consciousness: awake and alert Pain management: pain level controlled Vital Signs Assessment: post-procedure vital signs reviewed and stable Respiratory status: spontaneous breathing, nonlabored ventilation and respiratory function stable Cardiovascular status: blood pressure returned to baseline and stable Postop Assessment: no apparent nausea or vomiting Anesthetic complications: no    Last Vitals:  Vitals:   05/11/17 1300 05/11/17 1336  BP: (!) 135/57   Pulse: 85 78  Resp: (!) 25 (!) 26  Temp:    SpO2: 100% 100%    Last Pain:  Vitals:   05/11/17 1336  TempSrc:   PainSc: 0-No pain                 Catalina Gravel

## 2017-05-11 NOTE — Op Note (Signed)
Preoperative diagnosis: Endoscopically unresectable colon polyps with need for colectomy following prior colectomy/radiotherapy  Postop diagnosis: Same  Principal procedure: Cystoscopy, placement of bilateral ureteral catheters  Surgeon: Libby Goehring  Anesthesia: General endotracheal  Complications: None  Specimens: None  Estimated blood loss: None  Drains: 13 French Foley, bilateral 6 [place] open-ended catheters  Indications: 77 year old male with prior history of rectal cancer, status post low anterior resection, chemotherapy and radiation.  The patient has been found to have multiple tubular adenomas of the colon which are unresectable by endoscopy.  He presents at this time for colectomy by Dr. Dema Severin.  I have been asked to provide bilateral ureteral catheter placements to assist with proper resection and to avoid ureteral injury.  Description of procedure: The patient was identified in the holding area, and discussion was held with him regarding the placement of ureteral catheters.  He understood this.  He was administered preoperative antibiotics and taken to the operating room where general endotracheal anesthetic was administered.  He was placed in the dorsolithotomy position.  Genitalia and perineum were prepped and draped.  Proper timeout was performed.  A 22 French panendoscope was advanced through his urethra.  There is a minimal, easily passed bulbous urethral stricture.  The bladder was entered and inspected circumferentially.  There were no tumors, foreign bodies or trabeculations.  Both ureteral orifices were normal in configuration and location.  With the assistance of fluoroscopy, I negotiated both open-ended catheters of their respective right and left ureters.  Adequate positioning was seen with the proximal tip of the catheter is being in the area of the renal pelves bilaterally.  At this point, the bladder was drained and the scope removed.  A 16 French Foley catheter was  placed and the balloon filled with 10 cc of water.  Both 6 [place] open-ended catheters were tied to the Foley catheter and placed to straight drainage.  The patient tolerated procedure well.  Dr. Dema Severin then commenced with his part of the procedure.

## 2017-05-12 ENCOUNTER — Encounter (HOSPITAL_COMMUNITY): Payer: Self-pay | Admitting: Surgery

## 2017-05-12 LAB — CBC
HEMATOCRIT: 28.7 % — AB (ref 39.0–52.0)
HEMOGLOBIN: 9.5 g/dL — AB (ref 13.0–17.0)
MCH: 30 pg (ref 26.0–34.0)
MCHC: 33.1 g/dL (ref 30.0–36.0)
MCV: 90.5 fL (ref 78.0–100.0)
Platelets: 253 10*3/uL (ref 150–400)
RBC: 3.17 MIL/uL — ABNORMAL LOW (ref 4.22–5.81)
RDW: 14.3 % (ref 11.5–15.5)
WBC: 13.6 10*3/uL — ABNORMAL HIGH (ref 4.0–10.5)

## 2017-05-12 LAB — MAGNESIUM: Magnesium: 1.7 mg/dL (ref 1.7–2.4)

## 2017-05-12 LAB — BASIC METABOLIC PANEL
ANION GAP: 3 — AB (ref 5–15)
BUN: 15 mg/dL (ref 6–20)
CO2: 25 mmol/L (ref 22–32)
Calcium: 8 mg/dL — ABNORMAL LOW (ref 8.9–10.3)
Chloride: 112 mmol/L — ABNORMAL HIGH (ref 101–111)
Creatinine, Ser: 1.36 mg/dL — ABNORMAL HIGH (ref 0.61–1.24)
GFR calc Af Amer: 56 mL/min — ABNORMAL LOW (ref >60)
GFR calc non Af Amer: 48 mL/min — ABNORMAL LOW (ref >60)
GLUCOSE: 136 mg/dL — AB (ref 65–99)
POTASSIUM: 4 mmol/L (ref 3.5–5.1)
Sodium: 140 mmol/L (ref 135–145)

## 2017-05-12 LAB — PHOSPHORUS: Phosphorus: 4.1 mg/dL (ref 2.5–4.6)

## 2017-05-12 NOTE — Consult Note (Signed)
[facility] Nurse ostomy consult note Stoma type/location: RUQ ileostomy (end) Stomal assessment/size: 1 and 1/4 inches round, red, edematous, with os at center Peristomal assessment: intact, clear Treatment options for stomal/peristomal skin: skin barrier ring Output: brown/green effluent  Ostomy pouching: 1pc.convex pouch with skin barrier ring Education provided: Patient and spouse have been on line learning about ostomies and have mostly good information albeit some is outdated.  We discuss Lock and Roll closure, patient is able to practice and give return demon x2. We discuss the GI A&P as it pertains to stoma characteristics and some pouch characteristics before patient falls asleep (he recently used PCA).  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 We will continue education early next week as patient continues to recover.  Enrolled patient in [facility] program: No  WOC nursing team will follow, and will remain available to this patient, the nursing, surgical and medical teams.   Thanks, Maudie Flakes, MSN, RN, [place], Arther Abbott  Pager# [phone]

## 2017-05-12 NOTE — Evaluation (Signed)
Physical Therapy Evaluation Patient Details Name: Arthur Obrien MRN: [id number] DOB: Sep 17, 1940 Today's Date: 05/12/2017   History of Present Illness  s/p open colectomy, ileostomy and bil stents due to colon polyp.  H/O rectal CA  Clinical Impression  Pt admitted as above and presenting with functional mobility limitations 2* generalized weakness, mild ambulatory balance deficits and abdominal pain.  Pt should progress to dc home with family assist.    Follow Up Recommendations No PT follow up    Equipment Recommendations  None recommended by PT    Recommendations for Other Services       Precautions / Restrictions Precautions Precautions: Fall Precaution Comments: JP drain on L Restrictions Weight Bearing Restrictions: No      Mobility  Bed Mobility Overal bed mobility: Needs Assistance Bed Mobility: Rolling;Sit to Sidelying;Sidelying to Sit Rolling: Min guard Sidelying to sit: Min assist       General bed mobility comments: used bedrail  Transfers Overall transfer level: Needs assistance Equipment used: Rolling walker (2 wheeled) Transfers: Sit to/from Stand Sit to Stand: Min assist         General transfer comment: steadying assistance  Ambulation/Gait Ambulation/Gait assistance: Min assist;Min guard Ambulation Distance (Feet): 800 Feet Assistive device: Rolling walker (2 wheeled) Gait Pattern/deviations: Step-through pattern;Decreased step length - right;Decreased step length - left;Shuffle;Trunk flexed Gait velocity: decr Gait velocity interpretation: Below normal speed for age/gender General Gait Details: steady assist with min cues for posture and position from [employer]            Wheelchair Mobility    Modified Rankin (Stroke Patients Only)       Balance Overall balance assessment: No apparent balance deficits (not formally assessed)                                           Pertinent Vitals/Pain Pain  Assessment: 0-10 Pain Score: 6  Pain Location: abdomen Pain Descriptors / Indicators: Aching Pain Intervention(s): Limited activity within patient's tolerance;Monitored during session;Other (comment)(PCA)    Home Living Family/patient expects to be discharged to:: Private residence Living Arrangements: Spouse/significant other Available Help at Discharge: Family Type of Home: House Home Access: Stairs to enter Entrance Stairs-Rails: None Technical brewer of Steps: 3 Home Layout: One level Home Equipment: Shower seat - built in Additional Comments: son states if pt doesn't have some DME, he probably has it    Prior Function Level of Independence: Independent               Hand Dominance        Extremity/Trunk Assessment   Upper Extremity Assessment Upper Extremity Assessment: Overall WFL for tasks assessed    Lower Extremity Assessment Lower Extremity Assessment: Generalized weakness       Communication   Communication: No difficulties  Cognition Arousal/Alertness: Awake/alert Behavior During Therapy: WFL for tasks assessed/performed Overall Cognitive Status: Within Functional Limits for tasks assessed                                        General Comments      Exercises     Assessment/Plan    PT Assessment Patient needs continued PT services  PT Problem List Decreased activity tolerance;Decreased mobility;Pain;Decreased knowledge of use of DME       PT  Treatment Interventions DME instruction;Gait training;Stair training;Functional mobility training;Therapeutic activities;Therapeutic exercise;Patient/family education    PT Goals (Current goals can be found in the Care Plan section)  Acute Rehab PT Goals Patient Stated Goal: ice chips and return to independence PT Goal Formulation: With patient Time For Goal Achievement: 05/26/17 Potential to Achieve Goals: Good    Frequency Min 3X/week   Barriers to discharge         Co-evaluation PT/OT/SLP Co-Evaluation/Treatment: Yes Reason for Co-Treatment: For patient/therapist safety PT goals addressed during session: Mobility/safety with mobility OT goals addressed during session: ADL's and self-care       AM-PAC PT "6 Clicks" Daily Activity  Outcome Measure Difficulty turning over in bed (including adjusting bedclothes, sheets and blankets)?: A Lot Difficulty moving from lying on back to sitting on the side of the bed? : A Lot Difficulty sitting down on and standing up from a chair with arms (e.g., wheelchair, bedside commode, etc,.)?: A Little Help needed moving to and from a bed to chair (including a wheelchair)?: A Little Help needed walking in hospital room?: A Little Help needed climbing 3-5 steps with a railing? : A Little 6 Click Score: 16    End of Session   Activity Tolerance: Patient tolerated treatment well Patient left: in chair;with call bell/phone within reach;with family/visitor present Nurse Communication: Mobility status PT Visit Diagnosis: Difficulty in walking, not elsewhere classified (R26.2)    Time: 1007-1030 PT Time Calculation (min) (ACUTE ONLY): 23 min   Charges:   PT Evaluation $PT Eval Low Complexity: 1 Low     PT G Codes:        Pg [phone]   Chaise Mahabir 05/12/2017, 12:46 PM

## 2017-05-12 NOTE — Care Management Note (Signed)
77 yo M, s/p open colectomy, ileostomy and bil stents due to colon polyp. Received referral to assist with [facility] needs. PT is not recommending any HH or DME. Met with pt and wife. Pt plans to return home with the support of his wife. Wife stated that he walks without any DME. They denied any D/C needs.

## 2017-05-12 NOTE — Evaluation (Signed)
Occupational Therapy Evaluation Patient Details Name: EFE FAZZINO MRN: [id number] DOB: [birth year] Today's Date: 05/12/2017    History of Present Illness s/p open colectomy, ileostomy and bil stents due to colon polyp.  H/O rectal CA   Clinical Impression   This 77 year old man was admitted for the above sx.  OT will follow him in acute setting to further educate on shower transfer with use of RW for support if needed and reinforce bed mobility. Will also further educate on AE, if he is interested, for adls.  Pt will progress to independence when soreness subsides.    Follow Up Recommendations  Supervision/Assistance - 24 hour    Equipment Recommendations  Other (comment)    Recommendations for Other Services       Precautions / Restrictions Precautions Precautions: Fall Restrictions Weight Bearing Restrictions: No      Mobility Bed Mobility Overal bed mobility: Needs Assistance Bed Mobility: Rolling;Sit to Sidelying;Sidelying to Sit Rolling: Min guard Sidelying to sit: Min assist       General bed mobility comments: used bedrail  Transfers Overall transfer level: Needs assistance Equipment used: Rolling walker (2 wheeled) Transfers: Sit to/from Stand Sit to Stand: Min assist         General transfer comment: steadying assistance    Balance Overall balance assessment: No apparent balance deficits (not formally assessed)                                         ADL either performed or assessed with clinical judgement   ADL Overall ADL's : Needs assistance/impaired             Lower Body Bathing: Moderate assistance;Sit to/from stand       Lower Body Dressing: Maximal assistance;Sit to/from stand                 General ADL Comments: Min A for bed mobility, steadying assistance for standing. Wife will assist with adls as needed.  Pt will progress to independence as soreness improves     Vision         Perception      Praxis      Pertinent Vitals/Pain Pain Assessment: 0-10 Pain Score: 6  Pain Location: abdomen Pain Descriptors / Indicators: Aching Pain Intervention(s): Limited activity within patient's tolerance;Monitored during session;Repositioned;PCA encouraged     Hand Dominance     Extremity/Trunk Assessment Upper Extremity Assessment Upper Extremity Assessment: Overall WFL for tasks assessed(c/o sore shoulder since sx)           Communication Communication Communication: No difficulties(hoarse; sore throat)   Cognition Arousal/Alertness: Awake/alert Behavior During Therapy: WFL for tasks assessed/performed Overall Cognitive Status: Within Functional Limits for tasks assessed                                     General Comments       Exercises     Shoulder Instructions      Home Living Family/patient expects to be discharged to:: Private residence Living Arrangements: Spouse/significant other Available Help at Discharge: Family Type of Home: House Home Access: Stairs to enter Technical brewer of Steps: 3 Entrance Stairs-Rails: None Home Layout: One level     Bathroom Shower/Tub: Occupational psychologist: Handicapped height     Home Equipment: Multimedia programmer  seat - built in   Additional Comments: son states if pt doesn't have some DME, he probably has it      Prior Functioning/Environment Level of Independence: Independent                 OT Problem List: Pain;Decreased knowledge of use of DME or AE      OT Treatment/Interventions: Self-care/ADL training;DME and/or AE instruction;Patient/family education    OT Goals(Current goals can be found in the care plan section) Acute Rehab OT Goals Patient Stated Goal: ice chips and return to independence OT Goal Formulation: With patient Time For Goal Achievement: 05/26/17 Potential to Achieve Goals: Good ADL Goals Pt Will Perform Tub/Shower Transfer: with supervision;Shower  transfer;shower seat;ambulating Additional ADL Goal #1: pt will perform bed mobility at supervision level in preparation for adls and verbalize or demonstrate use of AE for adls  OT Frequency: Min 2X/week   Barriers to D/C:            Co-evaluation PT/OT/SLP Co-Evaluation/Treatment: Yes Reason for Co-Treatment: For patient/therapist safety PT goals addressed during session: Mobility/safety with mobility OT goals addressed during session: ADL's and self-care      AM-PAC PT "6 Clicks" Daily Activity     Outcome Measure Help from another person eating meals?: None Help from another person taking care of personal grooming?: A Little Help from another person toileting, which includes using toliet, bedpan, or urinal?: A Little Help from another person bathing (including washing, rinsing, drying)?: A Lot Help from another person to put on and taking off regular upper body clothing?: A Little Help from another person to put on and taking off regular lower body clothing?: A Lot 6 Click Score: 17   End of Session    Activity Tolerance: Patient tolerated treatment well Patient left: in chair;with call bell/phone within reach;with family/visitor present  OT Visit Diagnosis: Muscle weakness (generalized) (M62.81)                Time: 6294-7654 OT Time Calculation (min): 23 min Charges:  OT General Charges $OT Visit: 1 Visit OT Evaluation $OT Eval Low Complexity: 1 Low G-Codes:     Lesle Chris, OTR/L [phone] 05/12/2017   Thetis Schwimmer 05/12/2017, 10:53 AM

## 2017-05-12 NOTE — Progress Notes (Signed)
General Surgery [facility]  Assessment & Plan: POD#1 - Total proctocolectomy with end ileostomy, Small bowel resection, Lysis of adhesions   - allow sips water, ice chips today - PCA for pain control - encourage IS use, OOB to chair - Foley out tomorrow - await resolution of ileus        Earnstine Regal, MD, [facility]       Office: [phone]    Chief Complaint: Status post completion proctocolectomy with ileostomy  Subjective: Patient in bed, family at bedside.  Some pain last night, better this AM.  Using PCA.  Would like water and ice.  Objective: Vital signs in last 24 hours: Temp:  [97.5 F (36.4 C)-99.4 F (37.4 C)] 99.4 F (37.4 C) (01/12 0657) Pulse Rate:  [69-88] 82 (01/12 0657) Resp:  [18-28] 24 (01/12 0828) BP: (130-156)/(48-66) 133/58 (01/12 0657) SpO2:  [95 %-100 %] 98 % (01/12 0828)    Intake/Output from previous day: 01/11 0701 - 01/12 0700 In: 2650 [I.V.:2400; IV Piggyback:250] Out: 7412 [Urine:1045; Drains:460; Blood:300] Intake/Output this shift: No intake/output data recorded.  Physical Exam: HEENT - sclerae clear, mucous membranes moist Neck - soft Abdomen - soft without distension; dressings dry and intact; ostomy RLQ viable, minimal output; drain LLQ with serosanguinous output Ext - no edema, non-tender Neuro - alert & oriented, no focal deficits  Lab Results:  Recent Labs    05/11/17 1614 05/12/17 0451  WBC 14.0* 13.6*  HGB 11.5* 9.5*  HCT 35.4* 28.7*  PLT 270 253   BMET Recent Labs    05/11/17 1614 05/12/17 0451  NA  --  140  K  --  4.0  CL  --  112*  CO2  --  25  GLUCOSE  --  136*  BUN  --  15  CREATININE 1.54* 1.36*  CALCIUM  --  8.0*   PT/INR No results for input(s): LABPROT, INR in the last 72 hours. Comprehensive Metabolic Panel:    Component Value Date/Time   NA 140 05/12/2017 0451   NA 143 05/08/2017 0955   NA 144 02/15/2017 0901   NA 145 (H)  06/29/2016 1516   K 4.0 05/12/2017 0451   K 4.1 05/08/2017 0955   CL 112 (H) 05/12/2017 0451   CL 111 05/08/2017 0955   CO2 25 05/12/2017 0451   CO2 26 05/08/2017 0955   BUN 15 05/12/2017 0451   BUN 16 05/08/2017 0955   BUN 9 02/15/2017 0901   BUN 11 06/29/2016 1516   CREATININE 1.36 (H) 05/12/2017 0451   CREATININE 1.54 (H) 05/11/2017 1614   CREATININE 0.88 06/02/2015 0840   GLUCOSE 136 (H) 05/12/2017 0451   GLUCOSE 113 (H) 05/08/2017 0955   CALCIUM 8.0 (L) 05/12/2017 0451   CALCIUM 8.6 (L) 05/08/2017 0955   AST 44 (H) 04/02/2017 1019   AST 14 02/06/2014 0953   ALT 64 (H) 04/02/2017 1019   ALT 13 02/06/2014 0953   ALKPHOS 134 (H) 04/02/2017 1019   ALKPHOS 91 02/06/2014 0953   BILITOT 0.6 04/02/2017 1019   BILITOT 0.3 02/06/2014 0953   PROT 5.8 (L) 04/02/2017 1019   PROT 6.6 02/06/2014 0953   ALBUMIN 3.3 (L) 04/02/2017 1019   ALBUMIN 3.7 02/06/2014 0953    Studies/Results: Dg Abd Portable 1v  Result Date: 05/11/2017 CLINICAL DATA:  Post elective surgery. EXAM: PORTABLE ABDOMEN - 1 VIEW COMPARISON:  None. FINDINGS: Gaseous distention of small bowel loops  may represent an ongoing small bowel obstruction or postsurgical ileus. Postsurgical changes with surgical drain, postsurgical clips and skin staples overlie the pelvis. Enteric catheter with tip in the gastric cardia and side hole superior to the GE junction. IMPRESSION: Enteric catheter side hole above the GE junction. Readjustment may be needed. Gaseous distention of small bowel loops may represent an ongoing small bowel obstruction or postsurgical ileus. Electronically Signed   By: Fidela Salisbury Obrien.D.   On: 05/11/2017 12:44   Dg C-arm 1-60 Min-no Report  Result Date: 05/11/2017 Fluoroscopy was utilized by the requesting physician.  No radiographic interpretation.      Arthur Obrien 05/12/2017  Patient ID: Arthur Obrien, male   DOB: 08/21/1940, 77 y.o.   MRN: [id number]

## 2017-05-13 ENCOUNTER — Encounter (HOSPITAL_COMMUNITY): Payer: Self-pay | Admitting: Family Medicine

## 2017-05-13 DIAGNOSIS — Z419 Encounter for procedure for purposes other than remedying health state, unspecified: Secondary | ICD-10-CM

## 2017-05-13 DIAGNOSIS — I10 Essential (primary) hypertension: Secondary | ICD-10-CM

## 2017-05-13 DIAGNOSIS — C20 Malignant neoplasm of rectum: Secondary | ICD-10-CM

## 2017-05-13 DIAGNOSIS — D126 Benign neoplasm of colon, unspecified: Secondary | ICD-10-CM

## 2017-05-13 DIAGNOSIS — I4891 Unspecified atrial fibrillation: Secondary | ICD-10-CM | POA: Diagnosis present

## 2017-05-13 LAB — BASIC METABOLIC PANEL
ANION GAP: 7 (ref 5–15)
BUN: 14 mg/dL (ref 6–20)
CALCIUM: 8.1 mg/dL — AB (ref 8.9–10.3)
CO2: 26 mmol/L (ref 22–32)
CREATININE: 0.84 mg/dL (ref 0.61–1.24)
Chloride: 107 mmol/L (ref 101–111)
GFR calc Af Amer: >60 mL/min (ref >60)
Glucose, Bld: 95 mg/dL (ref 65–99)
Potassium: 3.8 mmol/L (ref 3.5–5.1)
Sodium: 140 mmol/L (ref 135–145)

## 2017-05-13 LAB — CBC
HCT: 26.5 % — ABNORMAL LOW (ref 39.0–52.0)
Hemoglobin: 8.5 g/dL — ABNORMAL LOW (ref 13.0–17.0)
MCH: 29.4 pg (ref 26.0–34.0)
MCHC: 32.1 g/dL (ref 30.0–36.0)
MCV: 91.7 fL (ref 78.0–100.0)
PLATELETS: 219 10*3/uL (ref 150–400)
RBC: 2.89 MIL/uL — ABNORMAL LOW (ref 4.22–5.81)
RDW: 14.6 % (ref 11.5–15.5)
WBC: 11.3 10*3/uL — ABNORMAL HIGH (ref 4.0–10.5)

## 2017-05-13 LAB — CK TOTAL AND CKMB (NOT AT ARMC)
CK, MB: 4.7 ng/mL (ref 0.5–5.0)
Relative Index: 1.1 (ref 0.0–2.5)
Total CK: 434 U/L — ABNORMAL HIGH (ref 49–397)

## 2017-05-13 LAB — TROPONIN I: Troponin I: <0.03 ng/mL (ref <0.03)

## 2017-05-13 LAB — MAGNESIUM: MAGNESIUM: 1.8 mg/dL (ref 1.7–2.4)

## 2017-05-13 LAB — PHOSPHORUS: PHOSPHORUS: 2.1 mg/dL — AB (ref 2.5–4.6)

## 2017-05-13 MED ORDER — TRAMADOL HCL 50 MG PO TABS
50.0000 mg | ORAL_TABLET | Freq: Four times a day (QID) | ORAL | Status: DC | PRN
  Administered 2017-05-13 (×2): 50 mg via ORAL
  Filled 2017-05-13 (×2): qty 1

## 2017-05-13 MED ORDER — DILTIAZEM HCL 100 MG IV SOLR
5.0000 mg/h | INTRAVENOUS | Status: DC
  Administered 2017-05-13: 15 mg/h via INTRAVENOUS
  Administered 2017-05-13 – 2017-05-14 (×2): 5 mg/h via INTRAVENOUS
  Filled 2017-05-13 (×3): qty 100

## 2017-05-13 MED ORDER — POLYVINYL ALCOHOL 1.4 % OP SOLN
1.0000 [drp] | OPHTHALMIC | Status: DC | PRN
  Filled 2017-05-13: qty 15

## 2017-05-13 NOTE — Progress Notes (Signed)
Dr. Marlou Starks aware of pt's episode of "A fib". NT currently obtaining EKG. See new orders received to transfer pt to ICU Stepdown to receive Cardizem drip, as well as EKG & cardiac enzymes/troponin.

## 2017-05-13 NOTE — Progress Notes (Signed)
Notified MD Marlou Starks in regards to patient Afib/RVR sustaining within 160s-170s, even with 5mg /hr of cardizem. Asked if we could titrate cardizem gtt up to 15mg  if needed. MD Marlou Starks ordered to place titration modification 5mg -15mg . Will complete and continue to monitor and assess.

## 2017-05-13 NOTE — Progress Notes (Addendum)
Transferred pt via bed accompanied by ICU RRT RN, NT, primary nurse, and wife to ICU Stepdown bed 1241. HR continuously monitored during transfer. Pt denies discomfort or chest pain. No distress. Report given to Ginger, RN, upon arrival to ICU regarding pt's hx, condition, current status, and recent VS. Ginger made aware of am order to dc foley. Urine is dark, cherry red. Pt and wife reluctant stating "the doctor said the foley would stay until the urine was clear." I had not had a chance to talk to  Dr. Marlou Starks regarding their concerns before pt's A fib event. Foley care done prior to transfer to ICU. ICU RN and charge RN prefers to keep foley given current situation at this time.

## 2017-05-13 NOTE — Progress Notes (Signed)
Christian, RRT RN, from ICU arrived with bed to transfer pt back to stepdown. VS obtained, HR monitored with fluctuates from very high to low continuously, and ICU RN prepared to start Cardizem drip prior to leaving unit. Pt remains calm with no pain or distress. Wife at bedside. Both reassured and educated about next level of care.

## 2017-05-13 NOTE — Progress Notes (Signed)
RRT RN in ICU notified of transfer order and pt's status. Christian, RN reported he would be right up and assigned stepdown bed for pt to be transfer to.

## 2017-05-13 NOTE — Progress Notes (Signed)
General Obrien [facility]  Assessment & Plan: POD#2 - Total proctocolectomy with end ileostomy, Small bowel resection, Lysis of adhesions - begin clear liquid diet today - discontinue PCA - Tramadol po for mild to moderate pain - encourage IS use, OOB to chair - Foley out today - encourage OOB, ambulation, IS use        Arthur Regal, Arthur Obrien, Arthur Ob[facility] P[facility]       Office: [phone]    Chief Complaint: Completion proctocolectomy with ileostomy  Subjective: Patient in bed, family at bedside.  Mild discomfort overnight.  Denies nausea or emesis.  Objective: Vital signs in last 24 hours: Temp:  [98.2 F (36.8 C)-99.1 F (37.3 C)] 98.4 F (36.9 C) (01/13 0557) Pulse Rate:  [69-89] 89 (01/13 0557) Resp:  [16-24] 20 (01/13 0557) BP: (116-157)/(51-89) 121/62 (01/13 0557) SpO2:  [96 %-100 %] 98 % (01/13 0557)    Intake/Output from previous day: 01/12 0701 - 01/13 0700 In: 1000 [I.V.:1000] Out: 1470 [Urine:1300; Drains:170] Intake/Output this shift: No intake/output data recorded.  Physical Exam: HEENT - sclerae clear, mucous membranes moist Neck - soft Chest - clear bilaterally Cor - RRR Abdomen - soft without distension; serosanguinous in JP drain; ostomy viable, minimal in bag; dressing intact Ext - no edema, non-tender Neuro - alert & oriented, no focal deficits  Lab Results:  Recent Labs    05/12/17 0451 05/13/17 0428  WBC 13.6* 11.3*  HGB 9.5* 8.5*  HCT 28.7* 26.5*  PLT 253 219   BMET Recent Labs    05/12/17 0451 05/13/17 0428  NA 140 140  K 4.0 3.8  CL 112* 107  CO2 25 26  GLUCOSE 136* 95  BUN 15 14  CREATININE 1.36* 0.84  CALCIUM 8.0* 8.1*   PT/INR No results for input(s): LABPROT, INR in the last 72 hours. Comprehensive Metabolic Panel:    Component Value Date/Time   NA 140 05/13/2017 0428   NA 140 05/12/2017 0451   NA 144 02/15/2017 0901   NA 145 (H) 06/29/2016 1516   K 3.8 05/13/2017  0428   K 4.0 05/12/2017 0451   CL 107 05/13/2017 0428   CL 112 (H) 05/12/2017 0451   CO2 26 05/13/2017 0428   CO2 25 05/12/2017 0451   BUN 14 05/13/2017 0428   BUN 15 05/12/2017 0451   BUN 9 02/15/2017 0901   BUN 11 06/29/2016 1516   CREATININE 0.84 05/13/2017 0428   CREATININE 1.36 (H) 05/12/2017 0451   CREATININE 0.88 06/02/2015 0840   GLUCOSE 95 05/13/2017 0428   GLUCOSE 136 (H) 05/12/2017 0451   CALCIUM 8.1 (L) 05/13/2017 0428   CALCIUM 8.0 (L) 05/12/2017 0451   AST 44 (H) 04/02/2017 1019   AST 14 02/06/2014 0953   ALT 64 (H) 04/02/2017 1019   ALT 13 02/06/2014 0953   ALKPHOS 134 (H) 04/02/2017 1019   ALKPHOS 91 02/06/2014 0953   BILITOT 0.6 04/02/2017 1019   BILITOT 0.3 02/06/2014 0953   PROT 5.8 (L) 04/02/2017 1019   PROT 6.6 02/06/2014 0953   ALBUMIN 3.3 (L) 04/02/2017 1019   ALBUMIN 3.7 02/06/2014 0953    Studies/Results: Dg Abd Portable 1v  Result Date: 05/11/2017 CLINICAL DATA:  Post elective Obrien. EXAM: PORTABLE ABDOMEN - 1 VIEW COMPARISON:  None. FINDINGS: Gaseous distention of small bowel loops may represent an ongoing small bowel obstruction or postsurgical ileus. Postsurgical changes with surgical drain, postsurgical clips and skin staples overlie  the pelvis. Enteric catheter with tip in the gastric cardia and side hole superior to the GE junction. IMPRESSION: Enteric catheter side hole above the GE junction. Readjustment may be needed. Gaseous distention of small bowel loops may represent an ongoing small bowel obstruction or postsurgical ileus. Electronically Signed   By: Arthur Salisbury Obrien.D.   On: 05/11/2017 12:44      Arthur Obrien 05/13/2017  Patient ID: Arthur Obrien, male   DOB: 1941/03/05, 77 y.o.   MRN: [id number]

## 2017-05-13 NOTE — Progress Notes (Signed)
Pt called nurse to room stating "I'm in Atrial fib". Upon entering the room pt resting in bed with no distress nor pain. Wife at side. Resting HR via Dinamap elevated and fluctuating as high as 162 and as low as 65. Pt reported "it started with in the last half hour." denies chest pain. Dr. Marlou Starks paged. Pt and wife reassured.

## 2017-05-13 NOTE — Consult Note (Signed)
Medicine consult   BRENNIN DURFEE TDD:[id number] DOB: [birth year] DOA: 05/11/2017  PCP: Rory Percy, MD  Reason for consult :  afib w rvr Consulting team :  General surgery dr Marlou Starks  HPI: Arthur Obrien is a 77 y.o. male with medical history significant of afib on xaralto chronically, HTN, GERD, rectal cancer with multiple high risk polyps per colonoscopy on 01/10/17 s/p elective total colectomy 05/11/17 has been doing well post op.  He reports he walked around the unit twice yesterday.  Feeling well with some expected and normal post op pain in his abdomen.  No fevers.  No leg swelling or edema.  No h/o chf.  He did have preop clearance by cardiology office.  Last echo in 2014 in [place] with nml ef.  His heart rate is normally controlled with long actiing diltiazem at 154m a day and he rarely has to take an extra 356mtablet that he has as needed.  Pt restarted his dilt pill post op.  Today he went into afib with rvr.  He has no sob.  No chest pain.  He does feel palipatations and fluttering .  Just started on diltiazem drip.  Referred for consult to help with his afib with rvr.  Review of Systems: As per HPI otherwise 10 point review of systems negative.   Past Medical History:  Diagnosis Date  . Abdominal pain, epigastric 12/14/2016  . Colon cancer ([place]   rectal stage 3  . Colon polyps   . Colon polyps   . Family history of breast cancer   . GERD (gastroesophageal reflux disease)   . HTN (hypertension)   . Hx of echocardiogram    a.  Echo (6/14): EF 55-60%, mild-mod LAE, normal RV function, mild MR, RVSP 37 mmHg, mild pulmonary hypertension    . Hypercholesterolemia 02/08/2012  . PAF (paroxysmal atrial fibrillation) ([facility]/29/2014  . Rectal cancer ([facility]/01/2012   Stage III cancer of the rectum with resection earlier in 1992, followed by combination chemotherapy with 5-FU, leucovorin and levamisole in conjunction with radiation therapy to the pelvis by Dr. NiNila Nephewand  he has had no evidence of recurrence thus far. His last colonoscopy was by Dr. HeBenson Norwayn November 2010 and for his next one, he would like to see Dr. NaHildred Laser Dr. FlPosey Rea. Sinus bradycardia 09/26/2012    Past Surgical History:  Procedure Laterality Date  . BIOPSY  01/10/2017   Procedure: BIOPSY;  Surgeon: ReRogene HoustonMD;  [facility];  Service: Endoscopy;;  polyps @ body and fundus  . CATARACT EXTRACTION     right(years ago) left 06/2010  . COLECTOMY N/A 05/11/2017   Procedure: OPEN COMPLETION COLECTOMY WITH ILEOSTOMY;  Surgeon: WhIleana RoupMD;  Location: [facility] ORS;  Service: General;  Laterality: N/A;  . COLON SURGERY    . COLONOSCOPY N/A 01/10/2017   Procedure: COLONOSCOPY;  Surgeon: ReRogene HoustonMD;  Location[facility];  Service: Endoscopy;  Laterality: N/A;  . COLONOSCOPY WITH PROPOFOL N/A 02/28/2017   Procedure: COLONOSCOPY WITH PROPOFOL WITH TATTOO;  Surgeon: WhIleana RoupMD;  [facility];  Service: General;  Laterality: N/A;  . CYSTOSCOPY WITH STENT PLACEMENT N/A 05/11/2017   Procedure: CYSTOSCOPY WITH BILATERAL STENT PLACEMENT;  Surgeon: DaFranchot GalloMD;  Location: [facility];  Service: Urology;  Laterality: N/A;  . ESOPHAGOGASTRODUODENOSCOPY N/A 01/10/2017   Procedure: ESOPHAGOGASTRODUODENOSCOPY (EGD);  Surgeon: ReRogene HoustonMD;  [facility];  Service: Endoscopy;  Laterality: N/A;  12:45-rescheduled to 9/12 @ 2:00pm per Lelon Frohlich  . EYE SURGERY    . ILEOSTOMY N/A 05/11/2017   Procedure: ILEOSTOMY;  Surgeon: Ileana Roup, MD;  Location: [facility];  Service: General;  Laterality: N/A;  . POLYPECTOMY  01/10/2017   Procedure: POLYPECTOMY;  Surgeon: Rogene Houston, MD;  [facility];  Service: Endoscopy;;  ascending colon, transverse colon, and splenic flexure  . TONSILLECTOMY       reports that  has never smoked. he has never used smokeless tobacco. He reports that he does not drink alcohol  or use drugs.  No Known Allergies  Family History  Problem Relation Age of Onset  . Hypertension Mother   . Congestive Heart Failure Mother   . Cancer Father        multiple myeloma  . Breast cancer Daughter 44       died at 6, reportedly tested neg for breast cancer genes in 2008  . Cancer Paternal Aunt        NOS  . Heart attack Neg Hx   . Stroke Neg Hx     Prior to Admission medications   Medication Sig Start Date End Date Taking? Authorizing Provider  acetaminophen (TYLENOL) 500 MG tablet Take 1,000 mg by mouth every 6 (six) hours as needed for mild pain or headache.   Yes [provider]  Cyanocobalamin 2500 MCG TABS Take 5,000 mcg by mouth daily. B12   Yes [provider]  diltiazem (CARDIZEM) 30 MG tablet TAKE ONE TABLET BY MOUTH EVERY 4 HOURS AS NEEDED FOR HEART PALPITATIONS/ RACING. Patient taking differently: TAKE 30 MG BY MOUTH EVERY 4 HOURS AS NEEDED FOR HEART PALPITATIONS/ RACING. 07/14/16  Yes Deboraha Sprang, MD  diltiazem (TIAZAC) 120 MG 24 hr capsule TAKE ONE CAPSULE BY MOUTH DAILY. Patient taking differently: TAKE 120 MG BY MOUTH DAILY. 04/11/17  Yes Deboraha Sprang, MD  diphenoxylate-atropine (LOMOTIL) 2.5-0.025 MG per tablet Take 1-2 tablets PO every 6 hours PRN diarrhea Patient taking differently: Take 2 tablets by mouth every 4 (four) hours as needed for diarrhea or loose stools.  02/08/12  Yes Kefalas, Manon Hilding, PA-C  Hypromellose (ARTIFICIAL TEARS OP) Place 1 drop into both eyes every 4 (four) hours as needed (for dry eyes).    Yes [provider]  lisinopril (PRINIVIL,ZESTRIL) 5 MG tablet TAKE ONE TABLET BY MOUTH DAILY. Patient taking differently: TAKE 5 MG BY MOUTH DAILY. 04/11/17  Yes Deboraha Sprang, MD  metroNIDAZOLE (FLAGYL) 500 MG tablet Take 1,000 mg by mouth See admin instructions. Take 1000 mg by mouth at 1400, 1500 and 2200 the day prior to your colon operation 03/07/17  Yes [provider]  Multiple Vitamin  (MULTIVITAMIN) tablet Take 1 tablet by mouth daily.     Yes [provider]  neomycin (MYCIFRADIN) 500 MG tablet Take 1,000 mg by mouth See admin instructions. Take 1000 mg by mouth at 1400, 1500 and 2200 the day prior to surgery 03/07/17  Yes [provider]  pantoprazole (PROTONIX) 40 MG tablet Take 1 tablet (40 mg total) by mouth 2 (two) times daily before a meal. 12/14/16  Yes Setzer, Terri L, NP  rivaroxaban (XARELTO) 20 MG TABS tablet Take 1 tablet (20 mg total) by mouth daily with supper. 03/01/17  Yes Ileana Roup, MD  polyethylene glycol powder (GLYCOLAX/MIRALAX) powder Take 0.5 Containers by mouth once.    [provider]  simethicone [facility])  125 MG chewable tablet Chew 125 mg by mouth every 6 (six) hours as needed for flatulence.    [provider]    Physical Exam: Vitals:   05/13/17 0557 05/13/17 0840 05/13/17 1253 05/13/17 1254  BP: 121/62  (!) 155/77   Pulse: 89  60 (!) 167  Resp: _0 Temp: 98.4 F (36.9 C)     TempSrc: Oral     SpO2: 98% 98%  99%  Weight:      Height:          Constitutional: NAD, calm, comfortable Vitals:   05/13/17 0557 05/13/17 0840 05/13/17 1253 05/13/17 1254  BP: 121/62  (!) 155/77   Pulse: 89  60 (!) 167  Resp: _1 Temp: 98.4 F (36.9 C)     TempSrc: Oral     SpO2: 98% 98%  99%  Weight:      Height:       Eyes: PERRL, lids and conjunctivae normal ENMT: Mucous membranes are moist. Posterior pharynx clear of any exudate or lesions.Normal dentition.  Neck: normal, supple, no masses, no thyromegaly Respiratory: clear to auscultation bilaterally, no wheezing, no crackles. Normal respiratory effort. No accessory muscle use.  Cardiovascular: irRegular rate and irrhythm, no murmurs / rubs / gallops. No extremity edema. 2+ pedal pulses. No carotid bruits.  Abdomen: appropriate tenderness, no masses palpated. No hepatosplenomegaly. Bowel sounds good. Musculoskeletal: no clubbing /  cyanosis. No joint deformity upper and lower extremities. Good ROM, no contractures. Normal muscle tone.  Skin: no rashes, lesions, ulcers. No induration Neurologic: CN 2-12 grossly intact. Sensation intact, DTR normal. Strength 5/5 in all 4.  Psychiatric: Normal judgment and insight. Alert and oriented x 3. Normal mood.    Labs on Admission: I have personally reviewed following labs and imaging studies  CBC: Recent Labs  Lab 05/08/17 0955 05/11/17 1614 05/12/17 0451 05/13/17 0428  WBC 7.3 14.0* 13.6* 11.3*  HGB 11.8* 11.5* 9.5* 8.5*  HCT 36.1* 35.4* 28.7* 26.5*  MCV 89.8 90.8 90.5 91.7  PLT 282 270 253 315   Basic Metabolic Panel: Recent Labs  Lab 05/08/17 0955 05/11/17 1614 05/12/17 0451 05/13/17 0428  NA 143  --  140 140  K 4.1  --  4.0 3.8  CL 111  --  112* 107  CO2 26  --  25 26  GLUCOSE 113*  --  136* 95  BUN 16  --  15 14  CREATININE 0.96 1.54* 1.36* 0.84  CALCIUM 8.6*  --  8.0* 8.1*  MG  --   --  1.7 1.8  PHOS  --   --  4.1 2.1*   GFR: Estimated Creatinine Clearance: 77.2 mL/min (by C-G formula based on SCr of 0.84 mg/dL). Liver Function Tests: No results for input(s): AST, ALT, ALKPHOS, BILITOT, PROT, ALBUMIN in the last 168 hours. No results for input(s): LIPASE, AMYLASE in the last 168 hours. No results for input(s): AMMONIA in the last 168 hours. Coagulation Profile: Recent Labs  Lab 05/08/17 0955  INR 0.97   Cardiac Enzymes: Recent Labs  Lab 05/13/17 1329  TROPONINI <0.03   BNP (last 3 results) No results for input(s): PROBNP in the last 8760 hours. HbA1C: No results for input(s): HGBA1C in the last 72 hours. CBG: No results for input(s): GLUCAP in the last 168 hours. Lipid Profile: No results for input(s): CHOL, HDL, LDLCALC, TRIG, CHOLHDL, LDLDIRECT in the last 72 hours. Thyroid Function Tests: No results for input(s): TSH,  T4TOTAL, FREET4, T3FREE, THYROIDAB in the last 72 hours. Anemia Panel: No results for input(s): VITAMINB12,  FOLATE, FERRITIN, TIBC, IRON, RETICCTPCT in the last 72 hours. Urine analysis: No results found for: COLORURINE, APPEARANCEUR, LABSPEC, PHURINE, GLUCOSEU, HGBUR, BILIRUBINUR, KETONESUR, PROTEINUR, UROBILINOGEN, NITRITE, LEUKOCYTESUR Sepsis Labs: !!!!!!!!!!!!!!!!!!!!!!!!!!!!!!!!!!!!!!!!!!!! _0 (procalcitonin:4,lacticidven:4) )No results found for this or any previous visit (from the past 240 hour(s)).   Radiological Exams on Admission: No results found.  EKG: Independently reviewed. afib with rvr with inflat twi Old chart reviewed Case discussed with dr toth general surgery  Assessment/Plan 77 yo male s/p colectomy with afib with rvr  Principal Problem:   Atrial fibrillation with RVR ([place])- agree with cardizem drip.  Trop neg.  No chest pain.  No volume overload.  No fever.  Cont to titrate drip to control rate below 100.   Would resume full anticoagulation when surgery comfortable with this.  On hep prophylactic dosing at this time.  ekg does have some twi in inf lateral leads, cannot pull up old ones to compare from 2014.  Will serial ekg over the next day.    Active Problems:   Rectal cancer ([place])- per surgery.  Seems to be doing well post op   Colon polyps- noted   HTN (hypertension)- stable     DVT prophylaxis:  Hep  Code Status:  full Family Communication:  none Disposition Plan:  Per surgery team  Please call [facility] with any questions.  We will follow along daily.  Thank you.   DAVID,RACHAL A MD [facility]  If 7PM-7AM, please contact night-coverage [email or web address] Password [facility]  05/13/2017, 2:54 PM

## 2017-05-14 LAB — CBC
HEMATOCRIT: 28.9 % — AB (ref 39.0–52.0)
HEMOGLOBIN: 9.3 g/dL — AB (ref 13.0–17.0)
MCH: 29.1 pg (ref 26.0–34.0)
MCHC: 32.2 g/dL (ref 30.0–36.0)
MCV: 90.3 fL (ref 78.0–100.0)
Platelets: 233 10*3/uL (ref 150–400)
RBC: 3.2 MIL/uL — ABNORMAL LOW (ref 4.22–5.81)
RDW: 14.2 % (ref 11.5–15.5)
WBC: 8.8 10*3/uL (ref 4.0–10.5)

## 2017-05-14 LAB — BASIC METABOLIC PANEL
ANION GAP: 9 (ref 5–15)
BUN: 10 mg/dL (ref 6–20)
CHLORIDE: 106 mmol/L (ref 101–111)
CO2: 23 mmol/L (ref 22–32)
Calcium: 8 mg/dL — ABNORMAL LOW (ref 8.9–10.3)
Creatinine, Ser: 0.86 mg/dL (ref 0.61–1.24)
GFR calc Af Amer: >60 mL/min (ref >60)
Glucose, Bld: 90 mg/dL (ref 65–99)
Potassium: 3.3 mmol/L — ABNORMAL LOW (ref 3.5–5.1)
SODIUM: 138 mmol/L (ref 135–145)

## 2017-05-14 LAB — MAGNESIUM: Magnesium: 1.8 mg/dL (ref 1.7–2.4)

## 2017-05-14 LAB — PHOSPHORUS: PHOSPHORUS: 2.2 mg/dL — AB (ref 2.5–4.6)

## 2017-05-14 MED ORDER — MAGNESIUM SULFATE 2 GM/50ML IV SOLN
2.0000 g | Freq: Once | INTRAVENOUS | Status: AC
  Administered 2017-05-14: 2 g via INTRAVENOUS
  Filled 2017-05-14: qty 50

## 2017-05-14 MED ORDER — POTASSIUM PHOSPHATES 15 MMOLE/5ML IV SOLN
20.0000 mmol | Freq: Once | INTRAVENOUS | Status: AC
  Administered 2017-05-14: 20 mmol via INTRAVENOUS
  Filled 2017-05-14: qty 6.67

## 2017-05-14 MED ORDER — DILTIAZEM HCL 30 MG PO TABS: 30.0000 mg | ORAL_TABLET | Freq: Four times a day (QID) | ORAL | Status: DC

## 2017-05-14 MED ORDER — DILTIAZEM HCL 30 MG PO TABS
30.0000 mg | ORAL_TABLET | Freq: Four times a day (QID) | ORAL | Status: DC
  Administered 2017-05-14 – 2017-05-15 (×5): 30 mg via ORAL
  Filled 2017-05-14 (×5): qty 1

## 2017-05-14 NOTE — Progress Notes (Signed)
Subjective Afib/rvr yesterday - transferred to step down, medicine consulted, started dilt gtt. Doing well since. Denies complaints this morning - stoma started working overnight. Denies n/v.  Objective: Vital signs in last 24 hours: Temp:  [98.1 F (36.7 C)-99.1 F (37.3 C)] 98.5 F (36.9 C) (01/14 0800) Pulse Rate:  [48-167] 92 (01/14 0700) Resp:  [16-26] 23 (01/14 0700) BP: (99-155)/(36-77) 116/48 (01/14 0700) SpO2:  [91 %-99 %] 94 % (01/14 0700)    Intake/Output from previous day: 01/13 0701 - 01/14 0700 In: 451.6 [P.O.:360; I.V.:91.6] Out: 1560 [Urine:1200; Drains:360] Intake/Output this shift: No intake/output data recorded.  Gen: NAD, comfortable CV: RRR Pulm: Normal work of breathing Abd: Soft, NT/ND; incision c/d/i without erythema; stool in appliance; stoma pink Ext: SCDs in place  Lab Results: CBC  Recent Labs    05/13/17 0428 05/14/17 0312  WBC 11.3* 8.8  HGB 8.5* 9.3*  HCT 26.5* 28.9*  PLT 219 233   BMET Recent Labs    05/13/17 0428 05/14/17 0312  NA 140 138  K 3.8 3.3*  CL 107 106  CO2 26 23  GLUCOSE 95 90  BUN 14 10  CREATININE 0.84 0.86  CALCIUM 8.1* 8.0*    Assessment/Plan: Patient Active Problem List   Diagnosis Date Noted  . Atrial fibrillation with RVR ([place]) 05/13/2017  . HTN (hypertension)   . Surgery, elective   . Colon polyp 05/11/2017  . Family history of breast cancer   . Colon polyps   . Abdominal pain, epigastric 12/14/2016  . Pain of upper abdomen 12/14/2016  . Elevated blood pressure 12/27/2012  . Atrial fibrillation ([place]) 09/26/2012  . Sinus bradycardia 09/26/2012  . Rectal cancer ([place]) 02/08/2012  . Hypercholesterolemia 02/08/2012   s/p Procedure(s): OPEN COMPLETION COLECTOMY WITH ILEOSTOMY ILEOSTOMY CYSTOSCOPY WITH BILATERAL STENT PLACEMENT 05/11/2017  -D/C Foley -Advance to GI soft diet -Diltiazem/AFib per medicine - I'm ok with him transferring to tele if this is ok with medicine. If he tolerates a diet  today, will plan to restart his home anticoagulation tomorrow and monitor hgb following -JP drain to bulb suction -Replace electrolytes -Ambulate 5x/day; IS 10x/hr while awake -Monitor ileostomy output -Disposition: Home health for stoma assistance; monitor stoma output and for toleration of diet   LOS: 3 days   Sharon Mt. Dema Severin, M.D. General and Colorectal Surgery [facility]

## 2017-05-14 NOTE — Progress Notes (Addendum)
PROGRESS NOTE  Arthur Obrien  NTI:[id number] DOB: 11-25-1940 DOA: 05/11/2017 PCP: Rory Percy, MD  Brief Narrative:   Arthur Obrien is a 77 y.o. male with medical history significant of afib on xarelto, HTN, GERD, rectal cancer with multiple high risk polyps per colonoscopy on 01/10/17 s/p elective total colectomy 05/11/17.  His heart rate is normally controlled with long acting diltiazem 120mg  daily and he rarely has to take an extra 30mg  tablet that he has as needed.  Pt restarted his dilt pill post op but he went into afib with rvr with palipatations and fluttering.  He was transferred to stepdown and his HR was controlled on a diltiazem gtt.  His diet has been advanced to CLD and his ostomy has some output.  Will transition to oral dilt today.   Assessment & Plan:  Atrial fibrillation with RVR ([place]), rate controlled on dilt gtt - d/c cardizem drip - start diltiazem 30mg  po q6h today so we can make fast adjustments if needed, but if HR stable on this dose, plan to resume home dilt again tomorrow - Okay to transfer this evening to tele bed.  - Trop neg - Per surgery, okay to resume xarelto tomorrow  - Keep electrolytes wnl:  Potassium phosphate and magnesium IV today  Active Problems:   Rectal cancer ([place])- per surgery.  Seems to be doing well post op.  Advancing diet   Colon polyps- noted   HTN (hypertension)- stable     Antimicrobials:  Anti-infectives (From admission, onward)   Start     Dose/Rate Route Frequency Ordered Stop   05/11/17 0644  cefoTEtan in Dextrose 5% (CEFOTAN) 2-2.08 GM-%(50ML) IVPB    Comments:  Carleene Cooper   : cabinet override      05/11/17 0644 05/11/17 1859       Subjective:  Palpitations have resolved.  Patient denies chest pains, shortness of breath.  He has some abdominal pains which he expected post surgery.  He has had some gas and some greenish stools in his ostomy bag.  Objective: Vitals:   05/14/17 0800 05/14/17 1000 05/14/17 1100  05/14/17 1200  BP: 126/72 (!) 119/45 (!) 146/52   Pulse: 80 67 73   Resp: (!) 27 (!) 23 (!) 27   Temp: 98.5 F (36.9 C)   98.3 F (36.8 C)  TempSrc: Oral   Oral  SpO2: 96% 96% 99%   Weight:      Height:        Intake/Output Summary (Last 24 hours) at 05/14/2017 1306 Last data filed at 05/14/2017 1100 Gross per 24 hour  Intake 1056 ml  Output 1350 ml  Net -294 ml   Filed Weights   05/11/17 0547  Weight: 78 kg (172 lb)    Examination:  General exam:  Adult male.  No acute distress.  HEENT:  NCAT, MMM Respiratory system: Clear to auscultation bilaterally Cardiovascular system: Irregular rate and rhythm, normal S1/S2. No murmurs, rubs, gallops or clicks.  Warm extremities Gastrointestinal system: Positive bowel sounds, soft, nondistended, nontender.  Ostomy with a small amount of air and greenish stool.  Staples are intact, edges of the craniocaudal midline incision are well approximated with minimal erythema, no induration, no purulence.  The dressings appear clean and dry. MSK:  Normal tone and bulk, no lower extremity edema Neuro:  Grossly intact    Data Reviewed: I have personally reviewed following labs and imaging studies  CBC: Recent Labs  Lab 05/08/17 0955 05/11/17 1614 05/12/17 0451  05/13/17 0428 05/14/17 0312  WBC 7.3 14.0* 13.6* 11.3* 8.8  HGB 11.8* 11.5* 9.5* 8.5* 9.3*  HCT 36.1* 35.4* 28.7* 26.5* 28.9*  MCV 89.8 90.8 90.5 91.7 90.3  PLT 282 270 253 219 627   Basic Metabolic Panel: Recent Labs  Lab 05/08/17 0955 05/11/17 1614 05/12/17 0451 05/13/17 0428 05/14/17 0312  NA 143  --  140 140 138  K 4.1  --  4.0 3.8 3.3*  CL 111  --  112* 107 106  CO2 26  --  25 26 23   GLUCOSE 113*  --  136* 95 90  BUN 16  --  15 14 10   CREATININE 0.96 1.54* 1.36* 0.84 0.86  CALCIUM 8.6*  --  8.0* 8.1* 8.0*  MG  --   --  1.7 1.8 1.8  PHOS  --   --  4.1 2.1* 2.2*   GFR: Estimated Creatinine Clearance: 75.4 mL/min (by C-G formula based on SCr of 0.86  mg/dL). Liver Function Tests: No results for input(s): AST, ALT, ALKPHOS, BILITOT, PROT, ALBUMIN in the last 168 hours. No results for input(s): LIPASE, AMYLASE in the last 168 hours. No results for input(s): AMMONIA in the last 168 hours. Coagulation Profile: Recent Labs  Lab 05/08/17 0955  INR 0.97   Cardiac Enzymes: Recent Labs  Lab 05/13/17 1329  CKTOTAL 434*  CKMB 4.7  TROPONINI <0.03   BNP (last 3 results) No results for input(s): PROBNP in the last 8760 hours. HbA1C: No results for input(s): HGBA1C in the last 72 hours. CBG: No results for input(s): GLUCAP in the last 168 hours. Lipid Profile: No results for input(s): CHOL, HDL, LDLCALC, TRIG, CHOLHDL, LDLDIRECT in the last 72 hours. Thyroid Function Tests: No results for input(s): TSH, T4TOTAL, FREET4, T3FREE, THYROIDAB in the last 72 hours. Anemia Panel: No results for input(s): VITAMINB12, FOLATE, FERRITIN, TIBC, IRON, RETICCTPCT in the last 72 hours. Urine analysis: No results found for: COLORURINE, APPEARANCEUR, LABSPEC, PHURINE, GLUCOSEU, HGBUR, BILIRUBINUR, KETONESUR, PROTEINUR, UROBILINOGEN, NITRITE, LEUKOCYTESUR Sepsis Labs: @LABRCNTIP (procalcitonin:4,lacticidven:4)  )No results found for this or any previous visit (from the past 240 hour(s)).    Radiology Studies: No results found.   Scheduled Meds: . acetaminophen  650 mg Oral Q6H  . diltiazem  30 mg Oral Q6H  . heparin injection (subcutaneous)  5,000 Units Subcutaneous Q8H  . lisinopril  5 mg Oral Daily  . pantoprazole  40 mg Oral BID AC   Continuous Infusions: . diltiazem (CARDIZEM) infusion 5 mg/hr (05/14/17 0550)  . lactated ringers 50 mL/hr (05/14/17 1045)  . potassium PHOSPHATE IVPB (mmol) 20 mmol (05/14/17 0754)     LOS: 3 days    Time spent: 30 min    Janece Canterbury, MD [facility] Pager [phone]  If 7PM-7AM, please contact night-coverage [email or web address] Password [email or web address] 05/14/2017, 1:06 PM

## 2017-05-14 NOTE — Progress Notes (Signed)
Physical Therapy Treatment Patient Details Name: Arthur Obrien MRN: [id number] DOB: 31-May-1940 Today's Date: 05/14/2017    History of Present Illness s/p open colectomy, ileostomy and bil stents due to colon polyp.  H/O rectal CA. Transferred to ICU with A fib with RVR.     PT Comments    Progressing with mobility.  Pt tolerated activity well. HR 72 bpm at rest, 96 bpm during ambulation. Will continue to follow.   Follow Up Recommendations  No PT follow up     Equipment Recommendations  None recommended by PT    Recommendations for Other Services       Precautions / Restrictions Precautions Precautions: Fall Precaution Comments: JP drain on L Restrictions Weight Bearing Restrictions: No    Mobility  Bed Mobility               General bed mobility comments: oob in recliner  Transfers Overall transfer level: Needs assistance   Transfers: Sit to/from Stand Sit to Stand: Min guard         General transfer comment: close guard for safety, lines.   Ambulation/Gait Ambulation/Gait assistance: Min guard Ambulation Distance (Feet): 350 Feet Assistive device: Rolling walker (2 wheeled)(IV pole) Gait Pattern/deviations: Step-through pattern     General Gait Details: mildly unsteady at times. Started out with RW but switched to just IV pole on return to room. HR up to 96 bpm at the highest.    Stairs            Wheelchair Mobility    Modified Rankin (Stroke Patients Only)       Balance                                            Cognition Arousal/Alertness: Awake/alert Behavior During Therapy: WFL for tasks assessed/performed Overall Cognitive Status: Within Functional Limits for tasks assessed                                        Exercises      General Comments        Pertinent Vitals/Pain Pain Assessment: 0-10 Pain Score: 4  Pain Location: abdomen Pain Descriptors / Indicators: Sore Pain  Intervention(s): Monitored during session;Repositioned    Home Living                      Prior Function            PT Goals (current goals can now be found in the care plan section) Progress towards PT goals: Progressing toward goals    Frequency    Min 3X/week      PT Plan Current plan remains appropriate    Co-evaluation              AM-PAC PT "6 Clicks" Daily Activity  Outcome Measure  Difficulty turning over in bed (including adjusting bedclothes, sheets and blankets)?: A Little Difficulty moving from lying on back to sitting on the side of the bed? : A Little Difficulty sitting down on and standing up from a chair with arms (e.g., wheelchair, bedside commode, etc,.)?: A Little Help needed moving to and from a bed to chair (including a wheelchair)?: A Little Help needed walking in hospital room?: A Little Help needed climbing 3-5 steps with  a railing? : A Little 6 Click Score: 18    End of Session   Activity Tolerance: Patient tolerated treatment well Patient left: in chair;with call bell/phone within reach;with family/visitor present   PT Visit Diagnosis: Difficulty in walking, not elsewhere classified (R26.2)     Time: 5573-2202 PT Time Calculation (min) (ACUTE ONLY): 13 min  Charges:  $Gait Training: 8-22 mins                    G Codes:          Weston Anna, MPT Pager: [phone]

## 2017-05-14 NOTE — Consult Note (Signed)
[place] Nurse ostomy follow up Stoma type/location: RUQ end ileostomy Stomal assessment/size: 1 3/8" round pink patent and producing liquid green effluent Peristomal assessment: intact Treatment options for stomal/peristomal skin: barrier ring and 1 piece convex pouch. Output liquid green effluent  Ostomy pouching: 1pc.convex with barrier ring  Education provided: Patient and wife at bedside.  Pouch change performed.  Patient emptied pouch first.  Removed old pouch and cleansed peristomal skin.  Measured and cut new pouch to fit.  Applied barrier ring and pouch independently.  States he feels more comfortable with process.  Reinforced use of toilet paper wicks while emptying..  Enrolled patient in [place] Start Discharge program: No WOC team will follow for ongoing ostomy teaching.  Domenic Moras RN BSN [place] Pager [phone]

## 2017-05-14 NOTE — Progress Notes (Signed)
Date:  May 14, 2017 Chart reviewed for concurrent status and case management needs.  Will continue to follow patient progress. A,fib and on Iv Cardizem drip. s/p ostomy formation.  Discharge Planning: following for needs.  None present at this time of review. Expected discharge date: January 172019 Rhonda Davis, BSN, [place], [place]

## 2017-05-15 DIAGNOSIS — E876 Hypokalemia: Secondary | ICD-10-CM

## 2017-05-15 LAB — CBC
HCT: 28.1 % — ABNORMAL LOW (ref 39.0–52.0)
Hemoglobin: 9.1 g/dL — ABNORMAL LOW (ref 13.0–17.0)
MCH: 29 pg (ref 26.0–34.0)
MCHC: 32.4 g/dL (ref 30.0–36.0)
MCV: 89.5 fL (ref 78.0–100.0)
PLATELETS: 264 10*3/uL (ref 150–400)
RBC: 3.14 MIL/uL — ABNORMAL LOW (ref 4.22–5.81)
RDW: 14.5 % (ref 11.5–15.5)
WBC: 6.7 10*3/uL (ref 4.0–10.5)

## 2017-05-15 LAB — BASIC METABOLIC PANEL
Anion gap: 7 (ref 5–15)
BUN: 9 mg/dL (ref 6–20)
CALCIUM: 8.4 mg/dL — AB (ref 8.9–10.3)
CHLORIDE: 106 mmol/L (ref 101–111)
CO2: 28 mmol/L (ref 22–32)
CREATININE: 0.84 mg/dL (ref 0.61–1.24)
Glucose, Bld: 115 mg/dL — ABNORMAL HIGH (ref 65–99)
Potassium: 3.2 mmol/L — ABNORMAL LOW (ref 3.5–5.1)
SODIUM: 141 mmol/L (ref 135–145)

## 2017-05-15 LAB — HEPARIN LEVEL (UNFRACTIONATED): Heparin Unfractionated: <0.1 IU/mL — ABNORMAL LOW (ref 0.30–0.70)

## 2017-05-15 LAB — MAGNESIUM: MAGNESIUM: 2 mg/dL (ref 1.7–2.4)

## 2017-05-15 LAB — PHOSPHORUS: PHOSPHORUS: 3.3 mg/dL (ref 2.5–4.6)

## 2017-05-15 LAB — APTT: aPTT: 33 seconds (ref 24–36)

## 2017-05-15 LAB — MRSA PCR SCREENING: MRSA BY PCR: POSITIVE — AB

## 2017-05-15 MED ORDER — MUPIROCIN 2 % EX OINT
1.0000 "application " | TOPICAL_OINTMENT | Freq: Two times a day (BID) | CUTANEOUS | Status: DC
  Administered 2017-05-15 – 2017-05-18 (×7): 1 via NASAL
  Filled 2017-05-15: qty 22

## 2017-05-15 MED ORDER — DILTIAZEM HCL ER COATED BEADS 120 MG PO CP24
120.0000 mg | ORAL_CAPSULE | Freq: Every day | ORAL | Status: DC
  Administered 2017-05-15 – 2017-05-18 (×4): 120 mg via ORAL
  Filled 2017-05-15 (×4): qty 1

## 2017-05-15 MED ORDER — POTASSIUM CHLORIDE CRYS ER 20 MEQ PO TBCR
40.0000 meq | EXTENDED_RELEASE_TABLET | ORAL | Status: AC
  Administered 2017-05-15 (×2): 40 meq via ORAL
  Filled 2017-05-15 (×2): qty 2

## 2017-05-15 MED ORDER — HEPARIN (PORCINE) IN NACL 100-0.45 UNIT/ML-% IJ SOLN
1400.0000 [IU]/h | INTRAMUSCULAR | Status: DC
  Administered 2017-05-16 (×2): 1400 [IU]/h via INTRAVENOUS
  Filled 2017-05-15 (×2): qty 250

## 2017-05-15 MED ORDER — CHLORHEXIDINE GLUCONATE CLOTH 2 % EX PADS
6.0000 | MEDICATED_PAD | Freq: Every day | CUTANEOUS | Status: DC
  Administered 2017-05-15 – 2017-05-18 (×3): 6 via TOPICAL

## 2017-05-15 MED ORDER — DILTIAZEM HCL 30 MG PO TABS
30.0000 mg | ORAL_TABLET | Freq: Four times a day (QID) | ORAL | Status: DC | PRN
  Administered 2017-05-17 (×2): 30 mg via ORAL
  Filled 2017-05-15 (×2): qty 1

## 2017-05-15 MED ORDER — HEPARIN (PORCINE) IN NACL 100-0.45 UNIT/ML-% IJ SOLN
1100.0000 [IU]/h | INTRAMUSCULAR | Status: DC
  Administered 2017-05-15: 1100 [IU]/h via INTRAVENOUS
  Filled 2017-05-15: qty 250

## 2017-05-15 NOTE — Progress Notes (Signed)
Patient is transferred

## 2017-05-15 NOTE — Progress Notes (Signed)
Physical Therapy Treatment Patient Details Name: Arthur Obrien MRN: [id number] DOB: 17-Sep-1940 Today's Date: 05/15/2017    History of Present Illness s/p open colectomy, ileostomy and bil stents due to colon polyp.  H/O rectal CA. Transferred to ICU with A fib with RVR.     PT Comments    Progressing well with mobility. HR in 90s range during ambulation. Will continue to follow.    Follow Up Recommendations  No PT follow up; Intermittent Supervision/Assist     Equipment Recommendations  None recommended by PT    Recommendations for Other Services       Precautions / Restrictions Precautions Precautions: Fall Precaution Comments: JP drain on L Restrictions Weight Bearing Restrictions: No    Mobility  Bed Mobility Overal bed mobility: Needs Assistance Bed Mobility: Supine to Sit   Sidelying to sit: Supervision       General bed mobility comments: for lines, safety  Transfers Overall transfer level: Needs assistance   Transfers: Sit to/from Stand Sit to Stand: Min guard         General transfer comment: close guard for safety, lines.   Ambulation/Gait Ambulation/Gait assistance: Min guard Ambulation Distance (Feet): 515 Feet Assistive device: (IV pole) Gait Pattern/deviations: Step-through pattern;Drifts right/left     General Gait Details: mildly unsteady at times. Fluctuating O2 sat reading between 86-92% on RA-pt denied dyspnea. He tolerated distance well.    Stairs            Wheelchair Mobility    Modified Rankin (Stroke Patients Only)       Balance Overall balance assessment: Needs assistance           Standing balance-Leahy Scale: Good                              Cognition Arousal/Alertness: Awake/alert Behavior During Therapy: WFL for tasks assessed/performed Overall Cognitive Status: Within Functional Limits for tasks assessed                                        Exercises       General Comments        Pertinent Vitals/Pain Pain Assessment: No/denies pain    Home Living                      Prior Function            PT Goals (current goals can now be found in the care plan section) Progress towards PT goals: Progressing toward goals    Frequency    Min 3X/week      PT Plan Current plan remains appropriate    Co-evaluation              AM-PAC PT "6 Clicks" Daily Activity  Outcome Measure  Difficulty turning over in bed (including adjusting bedclothes, sheets and blankets)?: None Difficulty moving from lying on back to sitting on the side of the bed? : None Difficulty sitting down on and standing up from a chair with arms (e.g., wheelchair, bedside commode, etc,.)?: A Little Help needed moving to and from a bed to chair (including a wheelchair)?: A Little Help needed walking in hospital room?: A Little Help needed climbing 3-5 steps with a railing? : A Little 6 Click Score: 20    End of Session   Activity Tolerance:  Patient tolerated treatment well Patient left: in chair;with call bell/phone within reach;with family/visitor present   PT Visit Diagnosis: Difficulty in walking, not elsewhere classified (R26.2)     Time: 6712-4580 PT Time Calculation (min) (ACUTE ONLY): 13 min  Charges:  $Gait Training: 8-22 mins                    G Codes:          Weston Anna, MPT Pager: [phone]

## 2017-05-15 NOTE — Progress Notes (Signed)
Subjective No acute events - Doing well since. Denies complaints this morning - stoma working and he was emptying without recording.  Objective: Vital signs in last 24 hours: Temp:  [98.2 F (36.8 C)-98.5 F (36.9 C)] 98.4 F (36.9 C) (01/15 0353) Pulse Rate:  [64-96] 71 (01/15 0700) Resp:  [12-27] 23 (01/15 0700) BP: (119-171)/(43-154) 157/66 (01/15 0700) SpO2:  [91 %-99 %] 97 % (01/15 0700)    Intake/Output from previous day: 01/14 0701 - 01/15 0700 In: 1896 [P.O.:720; I.V.:790; IV Piggyback:386] Out: 2275 [CZYSA:6301; Drains:350; Stool:150] Intake/Output this shift: No intake/output data recorded.  Gen: NAD, comfortable CV: RRR Pulm: Normal work of breathing Abd: Soft, NT/ND; incision c/d/i without erythema; stool in appliance; stoma pink Ext: SCDs in place  Lab Results: CBC  Recent Labs    05/14/17 0312 05/15/17 0249  WBC 8.8 6.7  HGB 9.3* 9.1*  HCT 28.9* 28.1*  PLT 233 264   BMET Recent Labs    05/14/17 0312 05/15/17 0249  NA 138 141  K 3.3* 3.2*  CL 106 106  CO2 23 28  GLUCOSE 90 115*  BUN 10 9  CREATININE 0.86 0.84  CALCIUM 8.0* 8.4*    Assessment/Plan: Patient Active Problem List   Diagnosis Date Noted  . Atrial fibrillation with RVR ([place]) 05/13/2017  . HTN (hypertension)   . Surgery, elective   . Colon polyp 05/11/2017  . Family history of breast cancer   . Colon polyps   . Abdominal pain, epigastric 12/14/2016  . Pain of upper abdomen 12/14/2016  . Elevated blood pressure 12/27/2012  . Atrial fibrillation ([place]) 09/26/2012  . Sinus bradycardia 09/26/2012  . Rectal cancer ([place]) 02/08/2012  . Hypercholesterolemia 02/08/2012   s/p Procedure(s): OPEN COMPLETION COLECTOMY WITH ILEOSTOMY ILEOSTOMY CYSTOSCOPY WITH BILATERAL STENT PLACEMENT 05/11/2017  -Diet as tolerated -Diltiazem/AFib per medicine - transfer to [place] for heparin drip without boluses - plan to monitor hemoglobin -JP drain to bulb suction -Replace  electrolytes -Ambulate 5x/day; IS 10x/hr while awake -Monitor ileostomy output -Disposition: Home health for stoma assistance; monitor stoma output and for toleration of diet   LOS: 4 days   Sharon Mt. Dema Severin, M.D. General and Colorectal Surgery [facility]

## 2017-05-15 NOTE — Progress Notes (Signed)
ANTICOAGULATION CONSULT NOTE   Pharmacy Consult for Heparin Indication: atrial fibrillation  No Known Allergies  Patient Measurements: Height: 5\' 11"  (180.3 cm) Weight: 172 lb (78 kg) IBW/kg (Calculated) : 75.3 Heparin Dosing Weight: actual weight  Vital Signs: Temp: 99.2 F (37.3 C) (01/15 1216) Temp Source: Oral (01/15 1216) BP: 149/60 (01/15 1216) Pulse Rate: 71 (01/15 1216)  Labs: Recent Labs    05/13/17 0428 05/13/17 1329 05/14/17 0312 05/15/17 0249 05/15/17 0924 05/15/17 1733  HGB 8.5*  --  9.3* 9.1*  --   --   HCT 26.5*  --  28.9* 28.1*  --   --   PLT 219  --  233 264  --   --   APTT  --   --   --   --  33  --   HEPARINUNFRC  --   --   --   --  <0.10* <0.10*  CREATININE 0.84  --  0.86 0.84  --   --   CKTOTAL  --  434*  --   --   --   --   CKMB  --  4.7  --   --   --   --   TROPONINI  --  <0.03  --   --   --   --     Estimated Creatinine Clearance: 77.2 mL/min (by C-G formula based on SCr of 0.84 mg/dL).   Medical History: Past Medical History:  Diagnosis Date  . Abdominal pain, epigastric 12/14/2016  . Colon cancer ([place]) 1992   rectal stage 3  . Colon polyps   . Colon polyps   . Family history of breast cancer   . GERD (gastroesophageal reflux disease)   . HTN (hypertension)   . Hx of echocardiogram    a.  Echo (6/14): EF 55-60%, mild-mod LAE, normal RV function, mild MR, RVSP 37 mmHg, mild pulmonary hypertension    . Hypercholesterolemia 02/08/2012  . PAF (paroxysmal atrial fibrillation) ([place]) 09/26/2012  . Rectal cancer ([place]) 02/08/2012   Stage III cancer of the rectum with resection earlier in 1992, followed by combination chemotherapy with 5-FU, leucovorin and levamisole in conjunction with radiation therapy to the pelvis by Dr. Nila Nephew, and he has had no evidence of recurrence thus far. His last colonoscopy was by Dr. Benson Norway in November 2010 and for his next one, he would like to see Dr. Hildred Laser.  Dr. Posey Rea  . Sinus  bradycardia 09/26/2012    Medications:  Scheduled:  . acetaminophen  650 mg Oral Q6H  . Chlorhexidine Gluconate Cloth  6 each Topical Q0600  . diltiazem  120 mg Oral Daily  . lisinopril  5 mg Oral Daily  . mupirocin ointment  1 application Nasal BID  . pantoprazole  40 mg Oral BID AC   Infusions:  . heparin 1,100 Units/hr (05/15/17 1025)  . lactated ringers 50 mL/hr at 05/15/17 1653    Assessment: 56 yoM admitted on 1/11 with PMH of afib on Xarelto prior to admission.  Xarelto was held prior to elective total colectomy 05/11/17.  Pharmacy is consulted to dose Heparin drip, NO bolus, OK from surgical service to resume anticoagulation.  PTA Xarelto 20 mg daily.  Last dose taken on 05/08/17.    Today, 05/15/2017:  Initial heparin level subtherapeutic  CBC: Hgb decreased but consistent with previous levels prior to start of heparin , pltc WNL  Goal of Therapy:  Heparin level 0.3-0.7 units/ml aPTT 66-102 seconds Monitor platelets by anticoagulation  protocol: Yes   Plan:   No heparin bolus  Increase heparin IV infusion to 1350 units/hr  Heparin level, aptt 8 hours after starting  Daily heparin level and CBC  Continue to monitor H&H and platelets  Doreene Eland, PharmD, BCPS.   Pager: [phone] 05/15/2017 7:36 PM

## 2017-05-15 NOTE — Consult Note (Signed)
[place] Nurse ostomy follow up Stoma type/location: RUQ ileostomy Enrolled patient in [employer] Discharge program: Yes, today.  4 pouches (1-piece convex with cutting surface 1 and 1/2 inches), 4 rings, belt and powder requested.  [place] nursing team will  follow, and will remain available to this patient, the nursing, surgical and medical teams.   Thanks, Maudie Flakes, MSN, RN, [place], Arther Abbott  Pager# [phone]

## 2017-05-15 NOTE — Progress Notes (Signed)
ANTICOAGULATION CONSULT NOTE - Initial Consult  Pharmacy Consult for Heparin Indication: atrial fibrillation  No Known Allergies  Patient Measurements: Height: 5\' 11"  (180.3 cm) Weight: 172 lb (78 kg) IBW/kg (Calculated) : 75.3 Heparin Dosing Weight: actual weight  Vital Signs: Temp: 98.2 F (36.8 C) (01/15 0800) Temp Source: Oral (01/15 0800) BP: 173/63 (01/15 0800) Pulse Rate: 71 (01/15 0800)  Labs: Recent Labs    05/13/17 0428 05/13/17 1329 05/14/17 0312 05/15/17 0249  HGB 8.5*  --  9.3* 9.1*  HCT 26.5*  --  28.9* 28.1*  PLT 219  --  233 264  CREATININE 0.84  --  0.86 0.84  CKTOTAL  --  434*  --   --   CKMB  --  4.7  --   --   TROPONINI  --  <0.03  --   --     Estimated Creatinine Clearance: 77.2 mL/min (by C-G formula based on SCr of 0.84 mg/dL).   Medical History: Past Medical History:  Diagnosis Date  . Abdominal pain, epigastric 12/14/2016  . Colon cancer ([place]) 1992   rectal stage 3  . Colon polyps   . Colon polyps   . Family history of breast cancer   . GERD (gastroesophageal reflux disease)   . HTN (hypertension)   . Hx of echocardiogram    a.  Echo (6/14): EF 55-60%, mild-mod LAE, normal RV function, mild MR, RVSP 37 mmHg, mild pulmonary hypertension    . Hypercholesterolemia 02/08/2012  . PAF (paroxysmal atrial fibrillation) ([place]) 09/26/2012  . Rectal cancer ([place]) 02/08/2012   Stage III cancer of the rectum with resection earlier in 1992, followed by combination chemotherapy with 5-FU, leucovorin and levamisole in conjunction with radiation therapy to the pelvis by Dr. Nila Nephew, and he has had no evidence of recurrence thus far. His last colonoscopy was by Dr. Benson Norway in November 2010 and for his next one, he would like to see Dr. Hildred Laser.  Dr. Posey Rea  . Sinus bradycardia 09/26/2012    Medications:  Scheduled:  . acetaminophen  650 mg Oral Q6H  . diltiazem  120 mg Oral Daily  . lisinopril  5 mg Oral Daily  . pantoprazole  40  mg Oral BID AC  . potassium chloride  40 mEq Oral Q4H   Infusions:  . lactated ringers 50 mL/hr at 05/15/17 0400    Assessment: 42 yoM admitted on 1/11 with PMH of afib on Xarelto prior to admission.  Xarelto was held prior to elective total colectomy 05/11/17.  Pharmacy is consulted to dose Heparin drip, NO bolus, OK from surgical service to resume anticoagulation.  PTA Xarelto 20 mg daily.  Last dose taken on 05/08/17.    Today, 05/15/2017: Baseline APTT, HL pending CBC: Hgb 9.1, remains low/stable.  Plt WNL SCr 0.84  Goal of Therapy:  Heparin level 0.3-0.7 units/ml aPTT 66-102 seconds Monitor platelets by anticoagulation protocol: Yes   Plan:   No heparin bolus  Start heparin IV infusion at 1100 units/hr  Heparin level, aptt 8 hours after starting  Daily heparin level and CBC  Continue to monitor H&H and platelets   Gretta Arab PharmD, BCPS Pager [phone] 05/15/2017 8:57 AM

## 2017-05-15 NOTE — Progress Notes (Signed)
Report was given to whitney RN on [place]. Patient is AOx4 and is in stable condition prior to transfer. Family is at the bedside,

## 2017-05-15 NOTE — Progress Notes (Signed)
PROGRESS NOTE  CORAN DIPAOLA  LKG:[id number] DOB: 1940/10/01 DOA: 05/11/2017 PCP: Rory Percy, MD  Brief Narrative:   Arthur Obrien is a 77 y.o. male with medical history significant of afib on xarelto, HTN, GERD, rectal cancer with multiple high risk polyps per colonoscopy on 01/10/17 s/p elective total colectomy 05/11/17.  His heart rate is normally controlled with long acting diltiazem 120mg  daily and he rarely has to take an extra 30mg  tablet that he has as needed.  Pt restarted his dilt pill post op but he went into afib with rvr with palipatations and fluttering.  He was transferred to stepdown and his HR was controlled on a diltiazem gtt.  His diet has been advanced to CLD and his ostomy has some output.  Will transition to oral dilt today.   Assessment & Plan:  Atrial fibrillation with RVR ([place]), rate controlled on dilt gtt -  Resume diltiazem 24h 120mg  once daily -  Start diltiazem 30mg  po q6h prn for tachycardia -  Start heparin gtt without boluses  -  Transfer to telemetry  - Trop neg - Keep electrolytes wnl:  Potassium oral supplementation   Active Problems:   Rectal cancer ([place])- per surgery.  Seems to be doing well post op.  Diet advanced.  JP drain still in place   Colon polyps- noted   HTN (hypertension)- mildly elevated this morning.  Resuming home medication     Antimicrobials:  Anti-infectives (From admission, onward)   Start     Dose/Rate Route Frequency Ordered Stop   05/11/17 0644  cefoTEtan in Dextrose 5% (CEFOTAN) 2-2.08 GM-%(50ML) IVPB    Comments:  Carleene Cooper   : cabinet override      05/11/17 0644 05/11/17 1859       Subjective:  Denies chest pains, palpitations, SOB.  Still has some mild residual abdominal pains but tolerating diet and having ostomy output regularly.   Objective: Vitals:   05/15/17 0400 05/15/17 0500 05/15/17 0600 05/15/17 0700  BP: (!) 124/98 (!) 130/51 (!) 132/51 (!) 157/66  Pulse: 95 67 65 71  Resp: 20 17 17  (!)  23  Temp:      TempSrc:      SpO2: 95% 94% 97% 97%  Weight:      Height:        Intake/Output Summary (Last 24 hours) at 05/15/2017 0809 Last data filed at 05/15/2017 0646 Gross per 24 hour  Intake 1362 ml  Output 2275 ml  Net -913 ml   Filed Weights   05/11/17 0547  Weight: 78 kg (172 lb)    Examination:  General exam:  Adult male.  No acute distress.  HEENT:  NCAT, MMM Respiratory system: Clear to auscultation bilaterally Cardiovascular system:  Regular rate and rhythm this morning, normal S1/S2. No murmurs, rubs, gallops or clicks.  Warm extremities Gastrointestinal system: Normal active bowel sounds, soft, nondistended, nontender.  Ostomy with scant greenish brown stool.  Midline craniocaudal incision with edges well approximated, no erythema or induration.  JP with serosanguinous drainage.   MSK:  Normal tone and bulk, no lower extremity edema Neuro:  Grossly intact  Data Reviewed: I have personally reviewed following labs and imaging studies  CBC: Recent Labs  Lab 05/11/17 1614 05/12/17 0451 05/13/17 0428 05/14/17 0312 05/15/17 0249  WBC 14.0* 13.6* 11.3* 8.8 6.7  HGB 11.5* 9.5* 8.5* 9.3* 9.1*  HCT 35.4* 28.7* 26.5* 28.9* 28.1*  MCV 90.8 90.5 91.7 90.3 89.5  PLT 270 253 219 233 264  Basic Metabolic Panel: Recent Labs  Lab 05/08/17 0955 05/11/17 1614 05/12/17 0451 05/13/17 0428 05/14/17 0312 05/15/17 0249  NA 143  --  140 140 138 141  K 4.1  --  4.0 3.8 3.3* 3.2*  CL 111  --  112* 107 106 106  CO2 26  --  25 26 23 28   GLUCOSE 113*  --  136* 95 90 115*  BUN 16  --  15 14 10 9   CREATININE 0.96 1.54* 1.36* 0.84 0.86 0.84  CALCIUM 8.6*  --  8.0* 8.1* 8.0* 8.4*  MG  --   --  1.7 1.8 1.8 2.0  PHOS  --   --  4.1 2.1* 2.2* 3.3   GFR: Estimated Creatinine Clearance: 77.2 mL/min (by C-G formula based on SCr of 0.84 mg/dL). Liver Function Tests: No results for input(s): AST, ALT, ALKPHOS, BILITOT, PROT, ALBUMIN in the last 168 hours. No results for  input(s): LIPASE, AMYLASE in the last 168 hours. No results for input(s): AMMONIA in the last 168 hours. Coagulation Profile: Recent Labs  Lab 05/08/17 0955  INR 0.97   Cardiac Enzymes: Recent Labs  Lab 05/13/17 1329  CKTOTAL 434*  CKMB 4.7  TROPONINI <0.03   BNP (last 3 results) No results for input(s): PROBNP in the last 8760 hours. HbA1C: No results for input(s): HGBA1C in the last 72 hours. CBG: No results for input(s): GLUCAP in the last 168 hours. Lipid Profile: No results for input(s): CHOL, HDL, LDLCALC, TRIG, CHOLHDL, LDLDIRECT in the last 72 hours. Thyroid Function Tests: No results for input(s): TSH, T4TOTAL, FREET4, T3FREE, THYROIDAB in the last 72 hours. Anemia Panel: No results for input(s): VITAMINB12, FOLATE, FERRITIN, TIBC, IRON, RETICCTPCT in the last 72 hours. Urine analysis: No results found for: COLORURINE, APPEARANCEUR, LABSPEC, PHURINE, GLUCOSEU, HGBUR, BILIRUBINUR, KETONESUR, PROTEINUR, UROBILINOGEN, NITRITE, LEUKOCYTESUR Sepsis Labs: @LABRCNTIP (procalcitonin:4,lacticidven:4)  )No results found for this or any previous visit (from the past 240 hour(s)).    Radiology Studies: No results found.   Scheduled Meds: . acetaminophen  650 mg Oral Q6H  . diltiazem  120 mg Oral Daily  . lisinopril  5 mg Oral Daily  . pantoprazole  40 mg Oral BID AC  . potassium chloride  40 mEq Oral Q4H   Continuous Infusions: . diltiazem (CARDIZEM) infusion 5 mg/hr (05/14/17 0550)  . lactated ringers 50 mL/hr at 05/15/17 0400     LOS: 4 days    Time spent: 30 min    Janece Canterbury, MD [facility] Pager [phone]  If 7PM-7AM, please contact night-coverage [email or web address] Password [email or web address] 05/15/2017, 8:09 AM

## 2017-05-16 DIAGNOSIS — I4891 Unspecified atrial fibrillation: Secondary | ICD-10-CM

## 2017-05-16 LAB — CBC
HEMATOCRIT: 26.9 % — AB (ref 39.0–52.0)
Hemoglobin: 8.9 g/dL — ABNORMAL LOW (ref 13.0–17.0)
MCH: 29.7 pg (ref 26.0–34.0)
MCHC: 33.1 g/dL (ref 30.0–36.0)
MCV: 89.7 fL (ref 78.0–100.0)
PLATELETS: 239 10*3/uL (ref 150–400)
RBC: 3 MIL/uL — ABNORMAL LOW (ref 4.22–5.81)
RDW: 14.5 % (ref 11.5–15.5)
WBC: 6.1 10*3/uL (ref 4.0–10.5)

## 2017-05-16 LAB — BASIC METABOLIC PANEL
Anion gap: 4 — ABNORMAL LOW (ref 5–15)
BUN: 8 mg/dL (ref 6–20)
CO2: 27 mmol/L (ref 22–32)
CREATININE: 0.75 mg/dL (ref 0.61–1.24)
Calcium: 8.3 mg/dL — ABNORMAL LOW (ref 8.9–10.3)
Chloride: 109 mmol/L (ref 101–111)
GFR calc Af Amer: >60 mL/min (ref >60)
GLUCOSE: 103 mg/dL — AB (ref 65–99)
POTASSIUM: 4 mmol/L (ref 3.5–5.1)
SODIUM: 140 mmol/L (ref 135–145)

## 2017-05-16 LAB — MAGNESIUM: Magnesium: 1.7 mg/dL (ref 1.7–2.4)

## 2017-05-16 LAB — HEPARIN LEVEL (UNFRACTIONATED)
Heparin Unfractionated: 0.31 IU/mL (ref 0.30–0.70)
Heparin Unfractionated: 0.32 IU/mL (ref 0.30–0.70)

## 2017-05-16 LAB — PHOSPHORUS: Phosphorus: 3.7 mg/dL (ref 2.5–4.6)

## 2017-05-16 MED ORDER — METOPROLOL TARTRATE 5 MG/5ML IV SOLN
5.0000 mg | INTRAVENOUS | Status: AC | PRN
  Administered 2017-05-16 (×2): 5 mg via INTRAVENOUS
  Filled 2017-05-16 (×2): qty 5

## 2017-05-16 MED ORDER — CAMPHOR-MENTHOL 0.5-0.5 % EX LOTN
TOPICAL_LOTION | CUTANEOUS | Status: DC | PRN
  Administered 2017-05-16: 10:00:00 via TOPICAL
  Filled 2017-05-16: qty 222

## 2017-05-16 MED ORDER — PSYLLIUM 95 % PO PACK
1.0000 | PACK | Freq: Every day | ORAL | Status: DC
  Administered 2017-05-16: 1 via ORAL
  Filled 2017-05-16 (×3): qty 1

## 2017-05-16 NOTE — Care Management Important Message (Addendum)
Important Message  Patient Details IM Letter given to Kathy/Case Manager to present to the Patient Name: MARLAND REINE MRN: [id number] Date of Birth: [birth year]   Medicare Important Message Given:  Yes    Kerin Salen 05/16/2017, 9:44 AMImportant Message  Patient Details  Name: DESI ROWE MRN: [id number] Date of Birth: 1941-02-11   Medicare Important Message Given:  Yes    Kerin Salen 05/16/2017, 9:44 AM

## 2017-05-16 NOTE — Progress Notes (Signed)
Subjective No acute events - Doing well since. Denies complaints this morning - stoma working - 650cc/24hrs.  Objective: Vital signs in last 24 hours: Temp:  [98.2 F (36.8 C)-99.4 F (37.4 C)] 98.3 F (36.8 C) (01/16 0503) Pulse Rate:  [68-73] 68 (01/16 0503) Resp:  [16-28] 16 (01/16 0503) BP: (133-173)/(60-66) 133/65 (01/16 0503) SpO2:  [96 %-100 %] 96 % (01/16 0503)    Intake/Output from previous day: 01/15 0701 - 01/16 0700 In: 2074.4 [P.O.:480; I.V.:1594.4] Out: 1655 [Urine:725; Drains:205; Stool:725] Intake/Output this shift: Total I/O In: 691.9 [P.O.:240; I.V.:451.9] Out: 31 [Urine:600; Drains:115; Stool:525]  Gen: NAD, comfortable CV: RRR Pulm: Normal work of breathing Abd: Soft, NT/ND; incision c/d/i without erythema; stool in appliance; stoma pink Ext: SCDs in place  Lab Results: CBC  Recent Labs    05/15/17 0249 05/16/17 0445  WBC 6.7 6.1  HGB 9.1* 8.9*  HCT 28.1* 26.9*  PLT 264 239   BMET Recent Labs    05/15/17 0249 05/16/17 0445  NA 141 140  K 3.2* 4.0  CL 106 109  CO2 28 27  GLUCOSE 115* 103*  BUN 9 8  CREATININE 0.84 0.75  CALCIUM 8.4* 8.3*    Assessment/Plan: Patient Active Problem List   Diagnosis Date Noted  . Atrial fibrillation with RVR ([place]) 05/13/2017  . HTN (hypertension)   . Surgery, elective   . Colon polyp 05/11/2017  . Family history of breast cancer   . Colon polyps   . Abdominal pain, epigastric 12/14/2016  . Pain of upper abdomen 12/14/2016  . Elevated blood pressure 12/27/2012  . Atrial fibrillation ([place]) 09/26/2012  . Sinus bradycardia 09/26/2012  . Rectal cancer ([place]) 02/08/2012  . Hypercholesterolemia 02/08/2012   s/p Procedure(s): OPEN COMPLETION COLECTOMY WITH ILEOSTOMY ILEOSTOMY CYSTOSCOPY WITH BILATERAL STENT PLACEMENT 05/11/2017  -Diet as tolerated -Diltiazem/AFib per medicine - tele monitoring -Hgb stable on heparin gtt - monitor today, repeat CBC in AM. If stable, tomorrow will plan to D/C  heparin and start Xarelto - possible D/C following day -JP drain to bulb suction -Ambulate 5x/day; IS 10x/hr while awake -Monitor ileostomy output -Disposition: Home health for stoma assistance; monitor stoma output and for toleration of diet   LOS: 5 days   Sharon Mt. Dema Severin, M.D. General and Colorectal Surgery [facility]

## 2017-05-16 NOTE — Progress Notes (Signed)
Occupational Therapy Treatment Patient Details Name: Arthur Obrien MRN: [id number] DOB: 1941/01/22 Today's Date: 05/16/2017    History of present illness s/p open colectomy, ileostomy and bil stents due to colon polyp.  H/O rectal CA. Transferred to ICU with A fib with RVR.    OT comments  Pt needs cues for bed mobility and light min A given for second foot into bed (L). Wife feels comfortable assisting him and cueing him.  All education completed  Follow Up Recommendations  Supervision/Assistance - 24 hour    Equipment Recommendations  None recommended by OT    Recommendations for Other Services      Precautions / Restrictions Precautions Precautions: Fall Precaution Comments: JP drain on L Restrictions Weight Bearing Restrictions: No       Mobility Bed Mobility     Rolling: Supervision       Sit to sidelying: Min assist General bed mobility comments: light min A to for second foot up onto bed; cues for technique. Wife said that she tells him to lie on his side and she feels comfortable cueing him  Transfers   Equipment used: None(IV pole)   Sit to Stand: Supervision         General transfer comment: pt held onto IV pole when walking most of the time; he took several steps without this and felt that he didn't need RW    Balance                                           ADL either performed or assessed with clinical judgement   ADL                                         General ADL Comments: pt wanted to ambulate; he feels that he won't have any difficulty getting into shower and there is a seat in it.  wife present and will help with adls as needed. Pt ambulated in hall at his request with IV pole--no unsteadiness noted; min guard for safety     Vision       Perception     Praxis      Cognition Arousal/Alertness: Awake/alert Behavior During Therapy: WFL for tasks assessed/performed Overall Cognitive Status:  Within Functional Limits for tasks assessed                                          Exercises     Shoulder Instructions       General Comments      Pertinent Vitals/ Pain       Pain Score: 4  Pain Location: abdomen Pain Descriptors / Indicators: Sore Pain Intervention(s): Limited activity within patient's tolerance;Monitored during session  Home Living                                          Prior Functioning/Environment              Frequency           Progress Toward Goals  OT Goals(current goals can now be found  in the care plan section)  Progress towards OT goals: Progressing toward goals(verbalizes shower transfer and bed mobility; steady)     Plan      Co-evaluation                 AM-PAC PT "6 Clicks" Daily Activity     Outcome Measure   Help from another person eating meals?: None Help from another person taking care of personal grooming?: A Little Help from another person toileting, which includes using toliet, bedpan, or urinal?: A Little Help from another person bathing (including washing, rinsing, drying)?: A Lot Help from another person to put on and taking off regular upper body clothing?: A Little Help from another person to put on and taking off regular lower body clothing?: A Lot 6 Click Score: 17    End of Session        Activity Tolerance Patient tolerated treatment well   Patient Left in bed;with call bell/phone within reach   Nurse Communication          Time: 6945-0388 OT Time Calculation (min): 16 min  Charges: OT General Charges $OT Visit: 1 Visit OT Treatments $Therapeutic Activity: 8-22 mins  Arthur Obrien, OTR/L [phone] 05/16/2017   Arthur Obrien 05/16/2017, 4:17 PM

## 2017-05-16 NOTE — Progress Notes (Signed)
ANTICOAGULATION CONSULT NOTE   Pharmacy Consult for Heparin Indication: atrial fibrillation  No Known Allergies  Patient Measurements: Height: 5\' 11"  (180.3 cm) Weight: 172 lb (78 kg) IBW/kg (Calculated) : 75.3 Heparin Dosing Weight: actual weight  Vital Signs: Temp: 98.2 F (36.8 C) (01/16 1338) Temp Source: Oral (01/16 1338) BP: 158/63 (01/16 1338) Pulse Rate: 59 (01/16 1338)  Labs: Recent Labs    05/14/17 0312 05/15/17 0249  05/15/17 0924 05/15/17 1733 05/16/17 0445 05/16/17 1349  HGB 9.3* 9.1*  --   --   --  8.9*  --   HCT 28.9* 28.1*  --   --   --  26.9*  --   PLT 233 264  --   --   --  239  --   APTT  --   --   --  33  --   --   --   HEPARINUNFRC  --   --    < > <0.10* <0.10* 0.31 0.32  CREATININE 0.86 0.84  --   --   --  0.75  --    < > = values in this interval not displayed.    Estimated Creatinine Clearance: 81.1 mL/min (by C-G formula based on SCr of 0.75 mg/dL).   Medical History: Past Medical History:  Diagnosis Date  . Abdominal pain, epigastric 12/14/2016  . Colon cancer ([place]) 1992   rectal stage 3  . Colon polyps   . Colon polyps   . Family history of breast cancer   . GERD (gastroesophageal reflux disease)   . HTN (hypertension)   . Hx of echocardiogram    a.  Echo (6/14): EF 55-60%, mild-mod LAE, normal RV function, mild MR, RVSP 37 mmHg, mild pulmonary hypertension    . Hypercholesterolemia 02/08/2012  . PAF (paroxysmal atrial fibrillation) ([place]) 09/26/2012  . Rectal cancer ([place]) 02/08/2012   Stage III cancer of the rectum with resection earlier in 1992, followed by combination chemotherapy with 5-FU, leucovorin and levamisole in conjunction with radiation therapy to the pelvis by Dr. Nila Nephew, and he has had no evidence of recurrence thus far. His last colonoscopy was by Dr. Benson Norway in November 2010 and for his next one, he would like to see Dr. Hildred Laser.  Dr. Posey Rea  . Sinus bradycardia 09/26/2012    Medications:   Scheduled:  . acetaminophen  650 mg Oral Q6H  . Chlorhexidine Gluconate Cloth  6 each Topical Q0600  . diltiazem  120 mg Oral Daily  . lisinopril  5 mg Oral Daily  . mupirocin ointment  1 application Nasal BID  . pantoprazole  40 mg Oral BID AC  . psyllium  1 packet Oral Daily   Infusions:  . heparin 1,400 Units/hr (05/16/17 0824)  . lactated ringers 50 mL/hr at 05/16/17 1234    Assessment: 61 yoM admitted on 1/11 with PMH of afib on Xarelto prior to admission.  Xarelto was held prior to elective total colectomy 05/11/17.  Pharmacy is consulted to dose Heparin drip, NO bolus, OK from surgical service to resume anticoagulation.  PTA Xarelto 20 mg daily.  Last dose taken on 05/08/17.    Today, 05/16/2017:  Heparin level therapeutic  CBC: Hgb decreased stable, pltc WNL  No reported bleeding  Goal of Therapy:  Heparin level 0.3-0.7 units/ml aPTT 66-102 seconds Monitor platelets by anticoagulation protocol: Yes   Plan:   Continue heparin IV infusion at 1400 units/hr  Daily heparin level and CBC  Continue to monitor H&H and  platelets   Adrian Saran, PharmD, BCPS Pager [phone] 05/16/2017 3:24 PM

## 2017-05-16 NOTE — Progress Notes (Signed)
ANTICOAGULATION CONSULT NOTE - Follow Up Consult  Pharmacy Consult for Heparin Indication: atrial fibrillation  No Known Allergies  Patient Measurements: Height: 5\' 11"  (180.3 cm) Weight: 172 lb (78 kg) IBW/kg (Calculated) : 75.3 Heparin Dosing Weight:   Vital Signs: Temp: 98.3 F (36.8 C) (01/16 0503) Temp Source: Oral (01/16 0503) BP: 133/65 (01/16 0503) Pulse Rate: 68 (01/16 0503)  Labs: Recent Labs    05/13/17 1329  05/14/17 0312 05/15/17 0249 05/15/17 0924 05/15/17 1733 05/16/17 0445  HGB  --    < > 9.3* 9.1*  --   --  8.9*  HCT  --   --  28.9* 28.1*  --   --  26.9*  PLT  --   --  233 264  --   --  239  APTT  --   --   --   --  33  --   --   HEPARINUNFRC  --   --   --   --  <0.10* <0.10* 0.31  CREATININE  --   --  0.86 0.84  --   --  0.75  CKTOTAL 434*  --   --   --   --   --   --   CKMB 4.7  --   --   --   --   --   --   TROPONINI <0.03  --   --   --   --   --   --    < > = values in this interval not displayed.    Estimated Creatinine Clearance: 81.1 mL/min (by C-G formula based on SCr of 0.75 mg/dL).   Medications:  Infusions:  . heparin 1,350 Units/hr (05/15/17 2004)  . lactated ringers 50 mL/hr at 05/15/17 1653    Assessment: Patient with heparin level at goal.  No heparin issues per RN.  While level at goal, it is lower end of goal.  Will increase heparin slightly to help maintain level within goal.  Goal of Therapy:  Heparin level 0.3-0.7 units/ml Monitor platelets by anticoagulation protocol: Yes   Plan:  Increase heparin to 1400 units/hr Recheck level at [address] [place] 05/16/2017,5:36 AM

## 2017-05-16 NOTE — Progress Notes (Signed)
PROGRESS NOTE    Arthur Obrien  TKZ:[id number] DOB: 01/19/1941 DOA: 05/11/2017 PCP: Rory Percy, MD   Brief Narrative:  Arthur Steen Robersonis Arthur Obrien 77 y.o.malewith medical history significant of afib on xarelto, HTN, GERD, rectal cancer with multiple high risk polyps per colonoscopy on 01/10/17 s/p elective total colectomy 05/11/17.  His heart rate is normally controlled with long acting diltiazem 120mg  daily and he rarely has to take an extra 30mg  tablet that he has as needed. Pt restarted his dilt pill post op but he went into afib with rvr with palipatations and fluttering.  He was transferred to stepdown and his HR was controlled on Arthur Obrien diltiazem gtt.  His diet has been advanced to CLD and his ostomy has some output.  Will transition to oral dilt today.   Assessment & Plan:   Principal Problem:   Atrial fibrillation with RVR ([facility]) Active Problems:   Rectal cancer ([facility])   Colon polyps   HTN (hypertension)   Surgery, elective   Atrial fibrillation with RVR ([place]), rate controlled on dilt gtt -  diltiazem 24h 120mg  once daily -  diltiazem 30mg  po q6h prn for tachycardia -  continue heparin gtt, per surgery plan to possibly transition back to xarelto tomorrow -  Transfer to telemetry -> currently in sinus rhythm - Trop neg - Keep electrolytes wnl:  Potassium oral supplementation   HTN: continue diltiazem and lisinopril  Rectal Cancer: s/p total proctocolectomy with end ileostomy on 05/11/17.  Management per surgery.  DVT prophylaxis: heparin Code Status: full  Family Communication: wife and in laws at bedisde Disposition Plan: pending  Primary Service:   surgery  Procedures:  05/11/17 1. Total proctocolectomy with end ileostomy 2.  Small bowel resection 3.  Lysis of adhesions - 60 minutes  Cystoscopy, placement of bilateral ureteral catheters  Antimicrobials:  Anti-infectives (From admission, onward)   Start     Dose/Rate Route Frequency Ordered Stop   05/11/17 0644   cefoTEtan in Dextrose 5% (CEFOTAN) 2-2.08 GM-%(50ML) IVPB    Comments:  Carleene Cooper   : cabinet override      05/11/17 0644 05/11/17 1859      Subjective: Feeling better.   Objective: Vitals:   05/15/17 2012 05/16/17 0503 05/16/17 0949 05/16/17 1338  BP: (!) 146/60 133/65 139/62 (!) 158/63  Pulse: 69 68  (!) 59  Resp: 18 16  19   Temp: 99.4 F (37.4 C) 98.3 F (36.8 C)  98.2 F (36.8 C)  TempSrc: Oral Oral  Oral  SpO2: 96% 96%  98%  Weight:      Height:        Intake/Output Summary (Last 24 hours) at 05/16/2017 1738 Last data filed at 05/16/2017 1608 Gross per 24 hour  Intake 1829.69 ml  Output 1885 ml  Net -55.31 ml   Filed Weights   05/11/17 0547  Weight: 78 kg (172 lb)    Examination:  General exam: Appears calm and comfortable  Respiratory system: Clear to auscultation. Respiratory effort normal. Cardiovascular system: S1 & S2 heard, RRR. No JVD, murmurs, rubs, gallops or clicks. No pedal edema. Gastrointestinal system: Abdomen is nondistended, soft and appropriately tender.  Ileostomy and JP drain in place.  Central nervous system: Alert and oriented. No focal neurological deficits. Extremities: Symmetric 5 x 5 power. Skin: No rashes, lesions or ulcers Psychiatry: Judgement and insight appear normal. Mood & affect appropriate.     Data Reviewed: I have personally reviewed following labs and imaging studies  CBC: Recent Labs  Lab 05/12/17 0451 05/13/17 0428 05/14/17 0312 05/15/17 0249 05/16/17 0445  WBC 13.6* 11.3* 8.8 6.7 6.1  HGB 9.5* 8.5* 9.3* 9.1* 8.9*  HCT 28.7* 26.5* 28.9* 28.1* 26.9*  MCV 90.5 91.7 90.3 89.5 89.7  PLT 253 219 233 264 242   Basic Metabolic Panel: Recent Labs  Lab 05/12/17 0451 05/13/17 0428 05/14/17 0312 05/15/17 0249 05/16/17 0445  NA 140 140 138 141 140  K 4.0 3.8 3.3* 3.2* 4.0  CL 112* 107 106 106 109  CO2 25 26 23 28 27   GLUCOSE 136* 95 90 115* 103*  BUN 15 14 10 9 8   CREATININE 1.36* 0.84 0.86 0.84 0.75    CALCIUM 8.0* 8.1* 8.0* 8.4* 8.3*  MG 1.7 1.8 1.8 2.0 1.7  PHOS 4.1 2.1* 2.2* 3.3 3.7   GFR: Estimated Creatinine Clearance: 81.1 mL/min (by C-G formula based on SCr of 0.75 mg/dL). Liver Function Tests: No results for input(s): AST, ALT, ALKPHOS, BILITOT, PROT, ALBUMIN in the last 168 hours. No results for input(s): LIPASE, AMYLASE in the last 168 hours. No results for input(s): AMMONIA in the last 168 hours. Coagulation Profile: No results for input(s): INR, PROTIME in the last 168 hours. Cardiac Enzymes: Recent Labs  Lab 05/13/17 1329  CKTOTAL 434*  CKMB 4.7  TROPONINI <0.03   BNP (last 3 results) No results for input(s): PROBNP in the last 8760 hours. HbA1C: No results for input(s): HGBA1C in the last 72 hours. CBG: No results for input(s): GLUCAP in the last 168 hours. Lipid Profile: No results for input(s): CHOL, HDL, LDLCALC, TRIG, CHOLHDL, LDLDIRECT in the last 72 hours. Thyroid Function Tests: No results for input(s): TSH, T4TOTAL, FREET4, T3FREE, THYROIDAB in the last 72 hours. Anemia Panel: No results for input(s): VITAMINB12, FOLATE, FERRITIN, TIBC, IRON, RETICCTPCT in the last 72 hours. Sepsis Labs: No results for input(s): PROCALCITON, LATICACIDVEN in the last 168 hours.  Recent Results (from the past 240 hour(s))  MRSA PCR Screening     Status: Abnormal   Collection Time: 05/15/17  8:37 AM  Result Value Ref Range Status   MRSA by PCR POSITIVE ([name]) NEGATIVE Final    Comment:        The GeneXpert MRSA Assay (FDA approved for NASAL specimens only), is one component of [name] comprehensive MRSA colonization surveillance program. It is not intended to diagnose MRSA infection nor to guide or monitor treatment for MRSA infections. RESULT CALLED TO, READ BACK BY AND VERIFIED WITH: AVEGNO,K @ 3536 ON [id number] BY POTEAT,S          Radiology Studies: No results found.      Scheduled Meds: . acetaminophen  650 mg Oral Q6H  . Chlorhexidine Gluconate  Cloth  6 each Topical Q0600  . diltiazem  120 mg Oral Daily  . lisinopril  5 mg Oral Daily  . mupirocin ointment  1 application Nasal BID  . pantoprazole  40 mg Oral BID AC  . psyllium  1 packet Oral Daily   Continuous Infusions: . heparin 1,400 Units/hr (05/16/17 0824)  . lactated ringers 50 mL/hr at 05/16/17 1234     LOS: 5 days    Time spent: over 32 min    Fayrene Helper, MD [facility] Pager [phone]  If 7PM-7AM, please contact night-coverage [email or web address] Password [email or web address] 05/16/2017, 5:38 PM

## 2017-05-16 NOTE — Care Management Note (Signed)
Case Management Note  Patient Details  Name: Arthur Obrien MRN: [id number] Date of Birth: Dec 09, 1940  Subjective/Objective: Patient chose-[facility] for HHRN-ostomy f/u rep Jermaine aware.                   Action/Plan:d/c home w/HHC   Expected Discharge Date:                  Expected Discharge Plan:  [place]  In-House Referral:     Discharge planning Services  CM Consult  Post Acute Care Choice:    Choice offered to:  Patient  DME Arranged:    DME Agency:     HH Arranged:  RN [place] Agency:  [facility] [facility]  Status of Service:  In process, will continue to follow  If discussed at Long Length of Stay Meetings, dates discussed:    Additional Comments:  Dessa Phi, RN 05/16/2017, 2:35 PM

## 2017-05-16 NOTE — Consult Note (Signed)
Rafael Hernandez Nurse ostomy follow up Stoma type/location: RUQ ileostomy Stomal assessment/size: 1 and 1/4 inch round, red, edematous, os at center Peristomal assessment: skin clear Treatment options for stomal/peristomal skin: skin barrier ring Output:brown liquid effluent Ostomy pouching: 1pc.convex with skin barrier ring Education provided: Extended session for pouch change, stoma measuring, pouch removal, skin cleansing, dry powder shave technique, and pouch application. Questions answered regarding diet, emptying schedule, change schedule, ordering supplies and HHRN answered. Enrolled patient in [place] [facility] Discharge program: Yes.  [place] nursing team will follow, and will remain available to this patient, the nursing, surgical and medical teams.  Thanks, Maudie Flakes, MSN, RN, [place], Arther Abbott  Pager# [phone]

## 2017-05-17 ENCOUNTER — Encounter (HOSPITAL_COMMUNITY): Payer: Self-pay

## 2017-05-17 LAB — CBC WITH DIFFERENTIAL/PLATELET
BASOS PCT: 0 %
Basophils Absolute: 0 10*3/uL (ref 0.0–0.1)
EOS ABS: 0.2 10*3/uL (ref 0.0–0.7)
Eosinophils Relative: 4 %
HCT: 29.3 % — ABNORMAL LOW (ref 39.0–52.0)
HEMOGLOBIN: 9.6 g/dL — AB (ref 13.0–17.0)
Lymphocytes Relative: 19 %
Lymphs Abs: 1.2 10*3/uL (ref 0.7–4.0)
MCH: 29.4 pg (ref 26.0–34.0)
MCHC: 32.8 g/dL (ref 30.0–36.0)
MCV: 89.6 fL (ref 78.0–100.0)
MONOS PCT: 8 %
Monocytes Absolute: 0.5 10*3/uL (ref 0.1–1.0)
NEUTROS PCT: 69 %
Neutro Abs: 4.3 10*3/uL (ref 1.7–7.7)
Platelets: 253 10*3/uL (ref 150–400)
RBC: 3.27 MIL/uL — AB (ref 4.22–5.81)
RDW: 14.4 % (ref 11.5–15.5)
WBC: 6.2 10*3/uL (ref 4.0–10.5)

## 2017-05-17 LAB — BASIC METABOLIC PANEL
Anion gap: 6 (ref 5–15)
BUN: 7 mg/dL (ref 6–20)
CHLORIDE: 108 mmol/L (ref 101–111)
CO2: 26 mmol/L (ref 22–32)
CREATININE: 0.78 mg/dL (ref 0.61–1.24)
Calcium: 8.3 mg/dL — ABNORMAL LOW (ref 8.9–10.3)
Glucose, Bld: 98 mg/dL (ref 65–99)
Potassium: 3.8 mmol/L (ref 3.5–5.1)
SODIUM: 140 mmol/L (ref 135–145)

## 2017-05-17 LAB — HEPARIN LEVEL (UNFRACTIONATED): Heparin Unfractionated: 0.23 IU/mL — ABNORMAL LOW (ref 0.30–0.70)

## 2017-05-17 MED ORDER — RIVAROXABAN 20 MG PO TABS
20.0000 mg | ORAL_TABLET | Freq: Every day | ORAL | Status: DC
  Administered 2017-05-17: 20 mg via ORAL
  Filled 2017-05-17: qty 1

## 2017-05-17 MED ORDER — HEPARIN (PORCINE) IN NACL 100-0.45 UNIT/ML-% IJ SOLN: 1400.0000 [IU]/h | INTRAMUSCULAR | Status: AC

## 2017-05-17 MED ORDER — METOPROLOL TARTRATE 5 MG/5ML IV SOLN
10.0000 mg | Freq: Once | INTRAVENOUS | Status: AC
Start: 2017-05-17 — End: 2017-05-17
  Administered 2017-05-17: 10 mg via INTRAVENOUS
  Filled 2017-05-17 (×2): qty 10

## 2017-05-17 NOTE — Progress Notes (Signed)
Subjective Afib/RVR overnight, now back to rate controlled - Doing well since. Denies complaints this morning - stoma working - 1050cc/24hrs  Objective: Vital signs in last 24 hours: Temp:  [98.1 F (36.7 C)-99 F (37.2 C)] 98.1 F (36.7 C) (01/17 3710) Pulse Rate:  [59-123] 80 (01/17 0633) Resp:  [16-19] 16 (01/17 6269) BP: (102-158)/(52-85) 113/52 (01/17 0633) SpO2:  [97 %-98 %] 97 % (01/17 0633) Last BM Date: 05/16/17  Intake/Output from previous day: 01/16 0701 - 01/17 0700 In: 1938.2 [P.O.:120; I.V.:1818.2] Out: 3120 [SWNIO:2703; Drains:225; JKKXF:8182] Intake/Output this shift: No intake/output data recorded.  Gen: NAD, comfortable CV: RRR Pulm: Normal work of breathing Abd: Soft, NT/ND; incision c/d/i without erythema; stool in appliance; stoma pink Ext: SCDs in place  Lab Results: CBC  Recent Labs    05/16/17 0445 05/17/17 0554  WBC 6.1 6.2  HGB 8.9* 9.6*  HCT 26.9* 29.3*  PLT 239 253   BMET Recent Labs    05/16/17 0445 05/17/17 0554  NA 140 140  K 4.0 3.8  CL 109 108  CO2 27 26  GLUCOSE 103* 98  BUN 8 7  CREATININE 0.75 0.78  CALCIUM 8.3* 8.3*    Assessment/Plan: Patient Active Problem List   Diagnosis Date Noted  . Atrial fibrillation with RVR ([place]) 05/13/2017  . HTN (hypertension)   . Surgery, elective   . Colon polyp 05/11/2017  . Family history of breast cancer   . Colon polyps   . Abdominal pain, epigastric 12/14/2016  . Pain of upper abdomen 12/14/2016  . Elevated blood pressure 12/27/2012  . Atrial fibrillation ([place]) 09/26/2012  . Sinus bradycardia 09/26/2012  . Rectal cancer ([place]) 02/08/2012  . Hypercholesterolemia 02/08/2012   s/p Procedure(s): OPEN COMPLETION COLECTOMY WITH ILEOSTOMY ILEOSTOMY CYSTOSCOPY WITH BILATERAL STENT PLACEMENT 05/11/2017  -Diet as tolerated -Diltiazem/AFib per medicine - tele monitoring -D/C heparin gtt. Restart Xarelto tonight -Continue JP drain to bulb suction -Ambulate 5x/day; IS 10x/hr while  awake -Monitor ileostomy output -Disposition: Home health for stoma assistance   LOS: 6 days   Sharon Mt. Dema Severin, M.D. General and Colorectal Surgery [facility]

## 2017-05-17 NOTE — Progress Notes (Signed)
PROGRESS NOTE    Arthur Obrien  ZOX:[id number] DOB: July 10, 1940 DOA: 05/11/2017 PCP: Rory Percy, MD   Brief Narrative:  Arthur Obrien 77 y.o.malewith medical history significant of afib on xarelto, HTN, GERD, rectal cancer with multiple high risk polyps per colonoscopy on 01/10/17 s/p elective total colectomy 05/11/17.  His heart rate is normally controlled with long acting diltiazem 120mg  daily and he rarely has to take an extra 30mg  tablet that he has as needed. Pt restarted his dilt pill post op but he went into afib with rvr with palipatations and fluttering.  He was transferred to stepdown and his HR was controlled on [name] diltiazem gtt.  His diet has been advanced to CLD and his ostomy has some output.  Will transition to oral dilt today.   Assessment & Plan:   Principal Problem:   Atrial fibrillation with RVR (HCC) Active Problems:   Rectal cancer (HCC)   Colon polyps   HTN (hypertension)   Surgery, elective   Atrial fibrillation with RVR ([place]), Pt converted to afib with RVR overnight and into this morning, but converted back to sinus rhythm this afternoon.  Received 2 doses of prn diltiazem.  For now, continue home mets with dilt 120 mg daily with 30 mg q6 prn RVR.  His HR in sinus is in the 60's so will not increase his dose.  Discussed following up with cardiology after this hospitalization -  diltiazem 24h 120mg  once daily -  diltiazem 30mg  po q6h prn for tachycardia -  continue heparin gtt, per surgery, transitioning to xarelto this PM -  Transfer to telemetry -> currently in sinus rhythm - Trop neg - Keep electrolytes wnl:  Potassium oral supplementation   HTN: continue diltiazem and lisinopril  Rectal Cancer: s/p total proctocolectomy with end ileostomy on 05/11/17.  Management per surgery.  DVT prophylaxis: heparin Code Status: full  Family Communication: wife and in laws at bedisde Disposition Plan: pending  Primary Service:   surgery  Procedures:    05/11/17 1. Total proctocolectomy with end ileostomy 2.  Small bowel resection 3.  Lysis of adhesions - 60 minutes  Cystoscopy, placement of bilateral ureteral catheters  Antimicrobials:  Anti-infectives (From admission, onward)   Start     Dose/Rate Route Frequency Ordered Stop   05/11/17 0644  cefoTEtan in Dextrose 5% (CEFOTAN) 2-2.08 GM-%(50ML) IVPB    Comments:  Carleene Cooper   : cabinet override      05/11/17 0644 05/11/17 1859      Subjective: Felt heart racing.  Now better.  Objective: Vitals:   05/17/17 0633 05/17/17 0912 05/17/17 0953 05/17/17 1403  BP: (!) 113/52 (!) 115/50 (!) 118/55 (!) 118/54  Pulse: 80   70  Resp: 16   18  Temp: 98.1 F (36.7 C)   98.7 F (37.1 C)  TempSrc: Oral   Oral  SpO2: 97%   98%  Weight:      Height:        Intake/Output Summary (Last 24 hours) at 05/17/2017 1537 Last data filed at 05/17/2017 0957 Gross per 24 hour  Intake 931.2 ml  Output 3500 ml  Net -2568.8 ml   Filed Weights   05/11/17 0547  Weight: 78 kg (172 lb)    Examination:  General: No acute distress. Cardiovascular: Heart sounds show [name] regular rate, and rhythm. No gallops or rubs. No murmurs. No JVD. Lungs: Clear to auscultation bilaterally with good air movement. No rales, rhonchi or wheezes. Abdomen: Soft, nontender, nondistended  with normal active bowel sounds. No masses. No hepatosplenomegaly. Ileostomy and JP drain.  Neurological: Alert and oriented 3. Moves all extremities 4 with equal strength. Cranial nerves II through XII grossly intact. Skin: Warm and dry. No rashes or lesions. Extremities: No clubbing or cyanosis. No edema. Pedal pulses 2+. Psychiatric: Mood and affect are normal. Insight and judgment are appropriate.  Data Reviewed: I have personally reviewed following labs and imaging studies  CBC: Recent Labs  Lab 05/13/17 0428 05/14/17 0312 05/15/17 0249 05/16/17 0445 05/17/17 0554  WBC 11.3* 8.8 6.7 6.1 6.2  NEUTROABS  --   --   --    --  4.3  HGB 8.5* 9.3* 9.1* 8.9* 9.6*  HCT 26.5* 28.9* 28.1* 26.9* 29.3*  MCV 91.7 90.3 89.5 89.7 89.6  PLT 219 233 264 239 102   Basic Metabolic Panel: Recent Labs  Lab 05/12/17 0451 05/13/17 0428 05/14/17 0312 05/15/17 0249 05/16/17 0445 05/17/17 0554  NA 140 140 138 141 140 140  K 4.0 3.8 3.3* 3.2* 4.0 3.8  CL 112* 107 106 106 109 108  CO2 25 26 23 28 27 26   GLUCOSE 136* 95 90 115* 103* 98  BUN 15 14 10 9 8 7   CREATININE 1.36* 0.84 0.86 0.84 0.75 0.78  CALCIUM 8.0* 8.1* 8.0* 8.4* 8.3* 8.3*  MG 1.7 1.8 1.8 2.0 1.7  --   PHOS 4.1 2.1* 2.2* 3.3 3.7  --    GFR: Estimated Creatinine Clearance: 81.1 mL/min (by C-G formula based on SCr of 0.78 mg/dL). Liver Function Tests: No results for input(s): AST, ALT, ALKPHOS, BILITOT, PROT, ALBUMIN in the last 168 hours. No results for input(s): LIPASE, AMYLASE in the last 168 hours. No results for input(s): AMMONIA in the last 168 hours. Coagulation Profile: No results for input(s): INR, PROTIME in the last 168 hours. Cardiac Enzymes: Recent Labs  Lab 05/13/17 1329  CKTOTAL 434*  CKMB 4.7  TROPONINI <0.03   BNP (last 3 results) No results for input(s): PROBNP in the last 8760 hours. HbA1C: No results for input(s): HGBA1C in the last 72 hours. CBG: No results for input(s): GLUCAP in the last 168 hours. Lipid Profile: No results for input(s): CHOL, HDL, LDLCALC, TRIG, CHOLHDL, LDLDIRECT in the last 72 hours. Thyroid Function Tests: No results for input(s): TSH, T4TOTAL, FREET4, T3FREE, THYROIDAB in the last 72 hours. Anemia Panel: No results for input(s): VITAMINB12, FOLATE, FERRITIN, TIBC, IRON, RETICCTPCT in the last 72 hours. Sepsis Labs: No results for input(s): PROCALCITON, LATICACIDVEN in the last 168 hours.  Recent Results (from the past 240 hour(s))  MRSA PCR Screening     Status: Abnormal   Collection Time: 05/15/17  8:37 AM  Result Value Ref Range Status   MRSA by PCR POSITIVE ([name]) NEGATIVE Final    Comment:         The GeneXpert MRSA Assay (FDA approved for NASAL specimens only), is one component of [name] comprehensive MRSA colonization surveillance program. It is not intended to diagnose MRSA infection nor to guide or monitor treatment for MRSA infections. RESULT CALLED TO, READ BACK BY AND VERIFIED WITH: AVEGNO,K @ 5852 ON [id number] BY POTEAT,S          Radiology Studies: No results found.      Scheduled Meds: . acetaminophen  650 mg Oral Q6H  . Chlorhexidine Gluconate Cloth  6 each Topical Q0600  . diltiazem  120 mg Oral Daily  . lisinopril  5 mg Oral Daily  . mupirocin ointment  1  application Nasal BID  . pantoprazole  40 mg Oral BID AC  . psyllium  1 packet Oral Daily  . rivaroxaban  20 mg Oral Q supper   Continuous Infusions: . lactated ringers 50 mL/hr at 05/16/17 1234     LOS: 6 days    Time spent: over 15 min    Fayrene Helper, MD [facility] Pager [phone]  If 7PM-7AM, please contact night-coverage [email or web address] Password [facility] 05/17/2017, 3:37 PM

## 2017-05-17 NOTE — Progress Notes (Signed)
Late entry: Central tele notified RN that pt had converted to Afib with HR in the 130s-150s @ 2245. RN paged NP on call and obtained orders for 5mg  of lopressor. 5mg  of lopressor given at 2150 and HR decreased for sometime but then elevated in to the 130-150s as high as 180. NP paged again and another order for 5mg  lopressor was given @ 2249. HR decreased again for sometime then elevated into the 130s-150s and as high as 180. NP instructed to give 30mg  PO of Cardizem. Pt maintained a HR in the 90s-120s for a few hours then began to sustain in to the 90s-130s. NP paged again and an order for 10mg  IV lopressor was obtained but Pt BP was 104/60 and RN was instructed to hold lopressor. Pt HR increased up to 190 and sustaining HR of 120s-150s  at 0430. NP paged and instructed to give 10mg  of lopressor. 10mg  lopressor given at 0440 and brought Pt HR down to the 80s-90s. BP remained stable. HR stable at this time. Will continue to monitor.

## 2017-05-17 NOTE — Progress Notes (Signed)
Physical Therapy Treatment Patient Details Name: Arthur Obrien MRN: [id number] DOB: August 07, 1940 Today's Date: 05/17/2017    History of Present Illness s/p open colectomy, ileostomy and bil stents due to colon polyp.  H/O rectal CA. Transferred to ICU with A fib with RVR.     PT Comments    Pt feeling better this afternoon.  HR now 69.  Assisted OOB to amb a great distance without holding to IV pole this session.    Follow Up Recommendations  No PT follow up     Equipment Recommendations  None recommended by PT    Recommendations for Other Services       Precautions / Restrictions Precautions Precautions: Fall Restrictions Weight Bearing Restrictions: No    Mobility  Bed Mobility Overal bed mobility: Modified Independent             General bed mobility comments: increased time  Transfers Overall transfer level: Needs assistance Equipment used: None Transfers: Sit to/from Stand Sit to Stand: Modified independent (Device/Increase time)         General transfer comment: good use of hands to staedy self  Ambulation/Gait Ambulation/Gait assistance: Supervision Ambulation Distance (Feet): 285 Feet Assistive device: None Gait Pattern/deviations: Step-through pattern Gait velocity: WFL   General Gait Details: good alternating gait.  did not allow him to hold to IV pole this session.  No LOB.     Stairs            Wheelchair Mobility    Modified Rankin (Stroke Patients Only)       Balance                                            Cognition Arousal/Alertness: Awake/alert Behavior During Therapy: WFL for tasks assessed/performed Overall Cognitive Status: Within Functional Limits for tasks assessed                                        Exercises      General Comments        Pertinent Vitals/Pain Pain Assessment: No/denies pain    Home Living                      Prior Function            PT Goals (current goals can now be found in the care plan section) Progress towards PT goals: Progressing toward goals    Frequency    Min 3X/week      PT Plan Current plan remains appropriate    Co-evaluation              AM-PAC PT "6 Clicks" Daily Activity  Outcome Measure  Difficulty turning over in bed (including adjusting bedclothes, sheets and blankets)?: None Difficulty moving from lying on back to sitting on the side of the bed? : None Difficulty sitting down on and standing up from a chair with arms (e.g., wheelchair, bedside commode, etc,.)?: None Help needed moving to and from a bed to chair (including a wheelchair)?: A Little Help needed walking in hospital room?: A Little Help needed climbing 3-5 steps with a railing? : A Little 6 Click Score: 21    End of Session Equipment Utilized During Treatment: Gait belt Activity Tolerance: Patient tolerated treatment well  Patient left: in chair;with call bell/phone within reach;with family/visitor present Nurse Communication: Mobility status PT Visit Diagnosis: Difficulty in walking, not elsewhere classified (R26.2)     Time: 0109-3235 PT Time Calculation (min) (ACUTE ONLY): 28 min  Charges:  $Gait Training: 8-22 mins $Therapeutic Activity: 8-22 mins                    G Codes:       Rica Koyanagi  PTA [facility]  Acute  Rehab Pager      [phone]

## 2017-05-17 NOTE — Progress Notes (Addendum)
Rapid Response Event Note  Overview:  RRT called to room 1417, regarding pt having unsustained HR to the 170-180s bpm and HR sustaining in the 120s-130 bpm.    Initial Focused Assessment: Pt AO, appears comfortable/ no complaint, no respiratory distress. No complaint of pain. HR on monitor and radial pulse irregular. No adventitious heart sounds.   Interventions: 10 mg IV Metoprolol, per NP order IVF increased, per NP order  See VS document in flowsheet  Plan of Care (if not transferred): HR controlled to 100 bpm following administration of IV Metoprolol. Continue to monitor HR and notify NP if HR sustains >130. Report off to attending physician HR interventions overnight.   Event Summary:  Arthur Obrien

## 2017-05-17 NOTE — Consult Note (Signed)
[place] Nurse ostomy follow up Stoma type/location: RUQ ileostomy Education provided: Made visit to today to work with wife on cutting wafers for pouch fitting.  She has difficulty due to weak hand strength. Demonstrated scissor and cutting technique to make the task easier.  She was able to demonstrate learning.  She asked appropriate questions and seemed interested in learning. Patient discouraged due to going back into afib rhythm with rate increase which means he is not discharging today.  Patient's wife asked questions about [facility], questioned answered, she has chosen [place] has already contacted her.  Enrolled patient in [facility] Discharge program: previously.  [place] nursing team will follow, and will remain available to this patient, the nursing, surgical and medical teams.   Fara Olden, RN-C, WTA-C, [place] Wound Treatment Associate Ostomy Care Associate

## 2017-05-17 NOTE — Progress Notes (Addendum)
ANTICOAGULATION CONSULT NOTE   Pharmacy Consult for Heparin Indication: atrial fibrillation  No Known Allergies  Patient Measurements: Height: 5\' 11"  (180.3 cm) Weight: 172 lb (78 kg) IBW/kg (Calculated) : 75.3 Heparin Dosing Weight: actual weight  Vital Signs: Temp: 98.1 F (36.7 C) (01/17 0633) Temp Source: Oral (01/17 4270) BP: 113/52 (01/17 6237) Pulse Rate: 80 (01/17 0633)  Labs: Recent Labs    05/15/17 0249 05/15/17 0924  05/16/17 0445 05/16/17 1349 05/17/17 0554  HGB 9.1*  --   --  8.9*  --  9.6*  HCT 28.1*  --   --  26.9*  --  29.3*  PLT 264  --   --  239  --  253  APTT  --  33  --   --   --   --   HEPARINUNFRC  --  <0.10*   < > 0.31 0.32 0.23*  CREATININE 0.84  --   --  0.75  --  0.78   < > = values in this interval not displayed.    Estimated Creatinine Clearance: 81.1 mL/min (by C-G formula based on SCr of 0.78 mg/dL).   Medical History: Past Medical History:  Diagnosis Date  . Abdominal pain, epigastric 12/14/2016  . Colon cancer ([place]) 1992   rectal stage 3  . Colon polyps   . Colon polyps   . Family history of breast cancer   . GERD (gastroesophageal reflux disease)   . HTN (hypertension)   . Hx of echocardiogram    a.  Echo (6/14): EF 55-60%, mild-mod LAE, normal RV function, mild MR, RVSP 37 mmHg, mild pulmonary hypertension    . Hypercholesterolemia 02/08/2012  . PAF (paroxysmal atrial fibrillation) ([place]) 09/26/2012  . Rectal cancer ([place]) 02/08/2012   Stage III cancer of the rectum with resection earlier in 1992, followed by combination chemotherapy with 5-FU, leucovorin and levamisole in conjunction with radiation therapy to the pelvis by Dr. Nila Nephew, and he has had no evidence of recurrence thus far. His last colonoscopy was by Dr. Benson Norway in November 2010 and for his next one, he would like to see Dr. Hildred Laser.  Dr. Posey Rea  . Sinus bradycardia 09/26/2012    Medications:  Scheduled:  . acetaminophen  650 mg Oral Q6H  .  Chlorhexidine Gluconate Cloth  6 each Topical Q0600  . diltiazem  120 mg Oral Daily  . lisinopril  5 mg Oral Daily  . mupirocin ointment  1 application Nasal BID  . pantoprazole  40 mg Oral BID AC  . psyllium  1 packet Oral Daily   Infusions:  . heparin 1,400 Units/hr (05/16/17 0824)  . lactated ringers 50 mL/hr at 05/16/17 1234    Assessment: 65 yoM admitted on 1/11 with PMH of afib on Xarelto prior to admission.  Xarelto was held prior to elective total colectomy 05/11/17.  Pharmacy is consulted to dose Heparin drip, NO bolus, OK from surgical service to resume anticoagulation.  PTA Xarelto 20 mg daily.  Last dose taken on 05/08/17.    Today, 05/17/2017:  Heparin level SUBtherapeutic this am  Per discussion with patient/wife, IV pump was beeping overnight so suspect level low d/t infusion interruptions   CBC: Hgb decreased, improved, pltc WNL  No reported bleeding   Goal of Therapy:  Heparin level 0.3-0.7 units/ml aPTT 66-102 seconds Monitor platelets by anticoagulation protocol: Yes   Plan:   Discuss with Dr Dema Severin, plans to continue heparin until noon then start xarelto 20mg  with supper at 17:00  Heparin level was low this am but patient and wife state that the IV pump was having issues overnight,  Continue heparin  1400 units/hr and d/c at 12-noon as per above  D/C heparin level and CBC  Monitor for bleeding  Doreene Eland, PharmD, BCPS.   Pager: [phone] 05/17/2017 7:05 AM

## 2017-05-18 LAB — BASIC METABOLIC PANEL
ANION GAP: 5 (ref 5–15)
BUN: 9 mg/dL (ref 6–20)
CO2: 30 mmol/L (ref 22–32)
Calcium: 8.4 mg/dL — ABNORMAL LOW (ref 8.9–10.3)
Chloride: 106 mmol/L (ref 101–111)
Creatinine, Ser: 0.89 mg/dL (ref 0.61–1.24)
GFR calc Af Amer: >60 mL/min (ref >60)
GFR calc non Af Amer: >60 mL/min (ref >60)
GLUCOSE: 108 mg/dL — AB (ref 65–99)
POTASSIUM: 4.2 mmol/L (ref 3.5–5.1)
Sodium: 141 mmol/L (ref 135–145)

## 2017-05-18 LAB — CBC WITH DIFFERENTIAL/PLATELET
BASOS ABS: 0 10*3/uL (ref 0.0–0.1)
Basophils Relative: 0 %
Eosinophils Absolute: 0.4 10*3/uL (ref 0.0–0.7)
Eosinophils Relative: 5 %
HCT: 27.2 % — ABNORMAL LOW (ref 39.0–52.0)
Hemoglobin: 8.9 g/dL — ABNORMAL LOW (ref 13.0–17.0)
LYMPHS ABS: 1.1 10*3/uL (ref 0.7–4.0)
LYMPHS PCT: 15 %
MCH: 29.8 pg (ref 26.0–34.0)
MCHC: 32.7 g/dL (ref 30.0–36.0)
MCV: 91 fL (ref 78.0–100.0)
MONO ABS: 0.6 10*3/uL (ref 0.1–1.0)
Monocytes Relative: 9 %
NEUTROS ABS: 5.2 10*3/uL (ref 1.7–7.7)
Neutrophils Relative %: 71 %
Platelets: 277 10*3/uL (ref 150–400)
RBC: 2.99 MIL/uL — AB (ref 4.22–5.81)
RDW: 14.8 % (ref 11.5–15.5)
WBC: 7.4 10*3/uL (ref 4.0–10.5)

## 2017-05-18 MED ORDER — TRAMADOL HCL 50 MG PO TABS: 50.0000 mg | ORAL_TABLET | Freq: Four times a day (QID) | ORAL | 0 refills | Status: DC | PRN

## 2017-05-18 NOTE — Discharge Summary (Addendum)
Patient ID: JAQUAVION MCCANNON MRN: [id number] DOB/AGE: Dec 20, 1940 77 y.o.  Admit date: 05/11/2017 Discharge date: 05/18/2017  Discharge Diagnoses Patient Active Problem List   Diagnosis Date Noted  . Atrial fibrillation with RVR ([place]) 05/13/2017  . HTN (hypertension)   . Surgery, elective   . Colon polyp 05/11/2017  . Family history of breast cancer   . Colon polyps   . Abdominal pain, epigastric 12/14/2016  . Pain of upper abdomen 12/14/2016  . Elevated blood pressure 12/27/2012  . Atrial fibrillation ([place]) 09/26/2012  . Sinus bradycardia 09/26/2012  . Rectal cancer ([place]) 02/08/2012  . Hypercholesterolemia 02/08/2012    Consultants Medicine service to assist with comorbidities - afib/rvr  Procedures Total proctocolectomy with end ileostomy, lysis of adhesions, small bowel resection - 05/11/17  Hospital Course:  Patient was admitted postoperatively. He recovered well. He had 2 runs of RVR that was managed by medicine service with metoprolol. His anticoagulant was gently restarted - starting on a heparin drip and once hgb was stable, transitioned to PO xarelto. On 05/18/17 his hgb had been stable, he was tolerating a diet and his ileostomy output was <1.2L per day with PO metamucil supplementation. His pain was controlled on oral analgesics and he was deemed stable for discharge home   Allergies as of 05/18/2017   No Known Allergies     Medication List    STOP taking these medications   metroNIDAZOLE 500 MG tablet Commonly known as:  FLAGYL   neomycin 500 MG tablet Commonly known as:  MYCIFRADIN   pantoprazole 40 MG tablet Commonly known as:  PROTONIX   polyethylene glycol powder powder Commonly known as:  GLYCOLAX/MIRALAX     TAKE these medications   acetaminophen 500 MG tablet Commonly known as:  TYLENOL Take 1,000 mg by mouth every 6 (six) hours as needed for mild pain or headache.   ARTIFICIAL TEARS OP Place 1 drop into both eyes every 4 (four) hours as needed  (for dry eyes).   Cyanocobalamin 2500 MCG Tabs Take 5,000 mcg by mouth daily. B12   diltiazem 120 MG 24 hr capsule Commonly known as:  TIAZAC TAKE ONE CAPSULE BY MOUTH DAILY. What changed:    how much to take  how to take this  when to take this   diltiazem 30 MG tablet Commonly known as:  CARDIZEM TAKE ONE TABLET BY MOUTH EVERY 4 HOURS AS NEEDED FOR HEART PALPITATIONS/ RACING. What changed:  See the new instructions.   diphenoxylate-atropine 2.5-0.025 MG tablet Commonly known as:  LOMOTIL Take 1-2 tablets PO every 6 hours PRN diarrhea What changed:    how much to take  how to take this  when to take this  reasons to take this  additional instructions   lisinopril 5 MG tablet Commonly known as:  PRINIVIL,ZESTRIL TAKE ONE TABLET BY MOUTH DAILY. What changed:    how much to take  how to take this  when to take this   multivitamin tablet Take 1 tablet by mouth daily.   rivaroxaban 20 MG Tabs tablet Commonly known as:  XARELTO Take 1 tablet (20 mg total) by mouth daily with supper.   simethicone 125 MG chewable tablet Commonly known as:  MYLICON Chew 818 mg by mouth every 6 (six) hours as needed for flatulence.   traMADol 50 MG tablet Commonly known as:  ULTRAM Take 1 tablet (50 mg total) by mouth every 6 (six) hours as needed (pain not controlled with ibuprofen/acetaminophen).  Follow-up Information    [facility]-Home Follow up.   Specialty:  Home Health Services Why:  [facility] nursing Contact information: [address] [place] [address] [phone]           Newt Levingston M. Dema Severin, M.D. [facility]

## 2017-05-18 NOTE — Discharge Instructions (Signed)
POST OP INSTRUCTIONS  1. DIET: As tolerated. Follow a light bland diet the first 24 hours after arrival home, such as soup, liquids, crackers, etc.  Be sure to include lots of fluids daily.  Avoid fast food or heavy meals as your are more likely to get nauseated.  Eat a low fat the next few days after surgery.  2. Take your usually prescribed home medications unless otherwise directed.  3. PAIN CONTROL: a. Pain is best controlled by a usual combination of three different methods TOGETHER: i. Ice/Heat ii. Over the counter pain medication iii. Prescription pain medication b. Most patients will experience some swelling and bruising around the surgical site.  Ice packs or heating pads (30-60 minutes up to 6 times a day) will help. Some people prefer to use ice alone, heat alone, alternating between ice & heat.  Experiment to what works for you.  Swelling and bruising can take several weeks to resolve.   c. It is helpful to take an over-the-counter pain medication regularly for the first few weeks: i. Ibuprofen (Motrin/Advil) - 200mg  tabs - take 3 tabs (600mg ) every 6 hours as needed for pain ii. Acetaminophen (Tylenol) - you may take 650mg  every 6 hours as needed. You can take this with motrin as they act differently on the body. If you are taking a narcotic pain medication that has acetaminophen in it, do not take over the counter tylenol at the same time.  Iii. NOTE: You may take both of these medications together - most patients  find it most helpful when alternating between the two (i.e. Ibuprofen at 6am,  tylenol at 9am, ibuprofen at 12pm ...) d. A  prescription for pain medication should be given to you upon discharge.  Take your pain medication as prescribed if your pain is not adequatly controlled with the over-the-counter pain reliefs mentioned above.  4. Monitor your ileostomy output closely and JP drain output.  Continue taking daily metamucil. Monitor and record your ileostomy output. If  your ileostomy output is >1.2L in 24hrs, begin taking imodium (over the counter) - 1 tablet 3 times daily. Continue to monitor. If the following day it is >1.2L, please contact our office for further instructions. Stay hydrated - drink at least 64oz of water per day. Monitor and record your JP drain output and bring this log to your follow-up appointment.    5. Dressing: Your incision has staples. It's ok to bathe normally in the shower. Avoid baths/pools/lakes/oceans until your wounds have fully healed.  6. ACTIVITIES as tolerated:   a. Avoid heavy lifting (>15lbs or 1 gallon of milk) for the next 6 weeks. b. You may resume regular (light) daily activities beginning the next day--such as daily self-care, walking, climbing stairs--gradually increasing activities as tolerated.  If you can walk 30 minutes without difficulty, it is safe to try more intense activity such as jogging, treadmill, bicycling, low-impact aerobics.  c. DO NOT PUSH THROUGH PAIN.  Let pain be your guide: If it hurts to do something, don't do it. d. Dennis Bast may drive when you are no longer taking prescription pain medication, you can comfortably wear a seatbelt, and you can safely maneuver your car and apply brakes.  7. FOLLOW UP in our office a. Please call [facility] at [phone] to set up an appointment to see your surgeon in the office for a follow-up appointment approximately 2 weeks after your surgery (end of next week). b. Make sure that you call for this appointment the day  you arrive home to insure a convenient appointment time.  9. If you have disability or family leave forms that need to be completed, you may have them completed by your primary care physician's office; for return to work instructions, please ask our office staff and they will be happy to assist you in obtaining this documentation   When to call us [phone]: 1. Poor pain control 2. Reactions / problems with new medications (rash/itching, etc)   3. Fever over 101.5 F (38.5 C) 4. Inability to urinate 5. Nausea/vomiting 6. Worsening swelling or bruising 7. Continued bleeding from incision. 8. Increased pain, redness, or drainage from the incision  The clinic staff is available to answer your questions during regular business hours (8:30am-5pm).  Please dont hesitate to call and ask to speak to one of our nurses for clinical concerns.   A surgeon from [facility] is always on call at the hospitals   If you have a medical emergency, go to the nearest emergency room or call 911.  [facility], [place] [address] [place], [place], [place]  [address] MAIN: [phone] FAX: [phone] [email or web address]

## 2017-05-18 NOTE — Care Management Note (Signed)
Case Management Note  Patient Details  Name: [name] MRN: [id number] Date of Birth: [birth year]  Subjective/Objective: d/c home w/[facility]. No further CM needs.                   Action/Plan:d/c home w/HHC.   Expected Discharge Date:  05/18/17               Expected Discharge Plan:  [place]  In-House Referral:     Discharge planning Services  CM Consult  Post Acute Care Choice:    Choice offered to:  Patient  DME Arranged:    DME Agency:     HH Arranged:  RN [place] [address] Agency:  [place]  Status of Service:  Completed, signed off  If discussed at [place] of Stay Meetings, dates discussed:    Additional Comments:  Dessa Phi, RN 05/18/2017, 9:38 AM

## 2017-05-18 NOTE — Progress Notes (Signed)
Pt and Pt's Wife and Son given discharge instructions including medications and schedules of medications. Pt to be discharged to home with JP and Pt and Wife given teaching regarding emptying and recharging. Wife able to return demonstration. Pt and family verbalized understanding of all discharge instructions and teaching. Packet with discharge instructions and Prescription with Pt at time of discharge.

## 2017-05-18 NOTE — Progress Notes (Signed)
PROGRESS NOTE    Arthur Obrien  GBT:[id number] DOB: 1940-11-06 DOA: 05/11/2017 PCP: Rory Percy, MD   Brief Narrative:  Arthur Obrien Arthur Obrien 77 y.o.malewith medical history significant of afib on xarelto, HTN, GERD, rectal cancer with multiple high risk polyps per colonoscopy on 01/10/17 s/p elective total colectomy 05/11/17.  His heart rate is normally controlled with long acting diltiazem 120mg  daily and he rarely has to take an extra 30mg  tablet that he has as needed. Pt restarted his dilt pill post op but he went into afib with rvr with palipatations and fluttering.  He was transferred to stepdown and his HR was controlled on Markel Mergenthaler diltiazem gtt.  His diet has been advanced to CLD and his ostomy has some output.  Will transition to oral dilt today.   Assessment & Plan:   Principal Problem:   Atrial fibrillation with RVR (HCC) Active Problems:   Rectal cancer (HCC)   Colon polyps   HTN (hypertension)   Surgery, elective   Atrial fibrillation with RVR ([place]),  Pt back in sinus this morning.  Will continue current regimen.  Encouraged cards f/u after discharge -  diltiazem 24h 120mg  once daily -  diltiazem 30mg  po q6h prn for tachycardia -  back on xarelto -  Transfer to telemetry -> currently in sinus rhythm - Trop neg - Keep electrolytes wnl:  Potassium oral supplementation   HTN: continue diltiazem and lisinopril  Rectal Cancer: s/p total proctocolectomy with end ileostomy on 05/11/17.  Management per surgery.  DVT prophylaxis: heparin Code Status: full  Family Communication: wife and in laws at bedisde Disposition Plan: pending  Primary Service:   surgery  Procedures:  05/11/17 1. Total proctocolectomy with end ileostomy 2.  Small bowel resection 3.  Lysis of adhesions - 60 minutes  Cystoscopy, placement of bilateral ureteral catheters  Antimicrobials:  Anti-infectives (From admission, onward)   Start     Dose/Rate Route Frequency Ordered Stop   05/11/17 0644   cefoTEtan in Dextrose 5% (CEFOTAN) 2-2.08 GM-%(50ML) IVPB    Comments:  Arthur Obrien   : cabinet override      05/11/17 0644 05/11/17 1859      Subjective: Feeling well  Objective: Vitals:   05/17/17 1403 05/17/17 2117 05/18/17 0617 05/18/17 0929  BP: (!) 118/54 (!) 125/46 (!) 127/52 (!) 125/55  Pulse: 70 62 61   Resp: 18 17 16    Temp: 98.7 F (37.1 C) 98.4 F (36.9 C) 98.3 F (36.8 C)   TempSrc: Oral Oral Oral   SpO2: 98% 99% 96%   Weight:      Height:        Intake/Output Summary (Last 24 hours) at 05/18/2017 2118 Last data filed at 05/18/2017 0815 Gross per 24 hour  Intake 890 ml  Output 20 ml  Net 870 ml   Filed Weights   05/11/17 0547  Weight: 78 kg (172 lb)    Examination:  General: No acute distress. Cardiovascular: Heart sounds show Rigdon Macomber regular rate, and rhythm. No gallops or rubs. No murmurs. No JVD. Lungs: Clear to auscultation bilaterally with good air movement. No rales, rhonchi or wheezes. Abdomen: Soft, nontender, nondistended with normal active bowel sounds. ileiostomy and JP drain.  Neurological: Alert and oriented 3. Moves all extremities 4 with equal strength. Cranial nerves II through XII grossly intact. Skin: Warm and dry. No rashes or lesions. Extremities: No clubbing or cyanosis. No edema. Pedal pulses 2+. Psychiatric: Mood and affect are normal. Insight and judgment are appropriate.  Data Reviewed: I have personally reviewed following labs and imaging studies  CBC: Recent Labs  Lab 05/14/17 0312 05/15/17 0249 05/16/17 0445 05/17/17 0554 05/18/17 0516  WBC 8.8 6.7 6.1 6.2 7.4  NEUTROABS  --   --   --  4.3 5.2  HGB 9.3* 9.1* 8.9* 9.6* 8.9*  HCT 28.9* 28.1* 26.9* 29.3* 27.2*  MCV 90.3 89.5 89.7 89.6 91.0  PLT 233 264 239 253 161   Basic Metabolic Panel: Recent Labs  Lab 05/12/17 0451 05/13/17 0428 05/14/17 0312 05/15/17 0249 05/16/17 0445 05/17/17 0554 05/18/17 0516  NA 140 140 138 141 140 140 141  K 4.0 3.8 3.3* 3.2*  4.0 3.8 4.2  CL 112* 107 106 106 109 108 106  CO2 25 26 23 28 27 26 30   GLUCOSE 136* 95 90 115* 103* 98 108*  BUN 15 14 10 9 8 7 9   CREATININE 1.36* 0.84 0.86 0.84 0.75 0.78 0.89  CALCIUM 8.0* 8.1* 8.0* 8.4* 8.3* 8.3* 8.4*  MG 1.7 1.8 1.8 2.0 1.7  --   --   PHOS 4.1 2.1* 2.2* 3.3 3.7  --   --    GFR: Estimated Creatinine Clearance: 72.9 mL/min (by C-G formula based on SCr of 0.89 mg/dL). Liver Function Tests: No results for input(s): AST, ALT, ALKPHOS, BILITOT, PROT, ALBUMIN in the last 168 hours. No results for input(s): LIPASE, AMYLASE in the last 168 hours. No results for input(s): AMMONIA in the last 168 hours. Coagulation Profile: No results for input(s): INR, PROTIME in the last 168 hours. Cardiac Enzymes: Recent Labs  Lab 05/13/17 1329  CKTOTAL 434*  CKMB 4.7  TROPONINI <0.03   BNP (last 3 results) No results for input(s): PROBNP in the last 8760 hours. HbA1C: No results for input(s): HGBA1C in the last 72 hours. CBG: No results for input(s): GLUCAP in the last 168 hours. Lipid Profile: No results for input(s): CHOL, HDL, LDLCALC, TRIG, CHOLHDL, LDLDIRECT in the last 72 hours. Thyroid Function Tests: No results for input(s): TSH, T4TOTAL, FREET4, T3FREE, THYROIDAB in the last 72 hours. Anemia Panel: No results for input(s): VITAMINB12, FOLATE, FERRITIN, TIBC, IRON, RETICCTPCT in the last 72 hours. Sepsis Labs: No results for input(s): PROCALCITON, LATICACIDVEN in the last 168 hours.  Recent Results (from the past 240 hour(s))  MRSA PCR Screening     Status: Abnormal   Collection Time: 05/15/17  8:37 AM  Result Value Ref Range Status   MRSA by PCR POSITIVE (Arthur Obrien) NEGATIVE Final    Comment:        The GeneXpert MRSA Assay (FDA approved for NASAL specimens only), is one component of Grettell Ransdell comprehensive MRSA colonization surveillance program. It is not intended to diagnose MRSA infection nor to guide or monitor treatment for MRSA infections. RESULT CALLED TO, READ  BACK BY AND VERIFIED WITH: AVEGNO,K @ 0960 ON [id number] BY POTEAT,S          Radiology Studies: No results found.      Scheduled Meds:  Continuous Infusions:    LOS: 7 days    Time spent: over 15 min    Fayrene Helper, MD [facility] Pager [phone]  If 7PM-7AM, please contact night-coverage [email or web address] Password [facility] 05/18/2017, 9:18 PM

## 2017-05-19 DIAGNOSIS — Z803 Family history of malignant neoplasm of breast: Secondary | ICD-10-CM | POA: Diagnosis not present

## 2017-05-19 DIAGNOSIS — Z432 Encounter for attention to ileostomy: Secondary | ICD-10-CM | POA: Diagnosis not present

## 2017-05-19 DIAGNOSIS — C20 Malignant neoplasm of rectum: Secondary | ICD-10-CM | POA: Diagnosis not present

## 2017-05-19 DIAGNOSIS — Z483 Aftercare following surgery for neoplasm: Secondary | ICD-10-CM | POA: Diagnosis not present

## 2017-05-19 DIAGNOSIS — E7800 Pure hypercholesterolemia, unspecified: Secondary | ICD-10-CM | POA: Diagnosis not present

## 2017-05-19 DIAGNOSIS — I4891 Unspecified atrial fibrillation: Secondary | ICD-10-CM | POA: Diagnosis not present

## 2017-05-19 DIAGNOSIS — I10 Essential (primary) hypertension: Secondary | ICD-10-CM | POA: Diagnosis not present

## 2017-05-21 DIAGNOSIS — E7800 Pure hypercholesterolemia, unspecified: Secondary | ICD-10-CM | POA: Diagnosis not present

## 2017-05-21 DIAGNOSIS — Z483 Aftercare following surgery for neoplasm: Secondary | ICD-10-CM | POA: Diagnosis not present

## 2017-05-21 DIAGNOSIS — C20 Malignant neoplasm of rectum: Secondary | ICD-10-CM | POA: Diagnosis not present

## 2017-05-21 DIAGNOSIS — Z432 Encounter for attention to ileostomy: Secondary | ICD-10-CM | POA: Diagnosis not present

## 2017-05-21 DIAGNOSIS — I4891 Unspecified atrial fibrillation: Secondary | ICD-10-CM | POA: Diagnosis not present

## 2017-05-21 DIAGNOSIS — I10 Essential (primary) hypertension: Secondary | ICD-10-CM | POA: Diagnosis not present

## 2017-05-22 DIAGNOSIS — I10 Essential (primary) hypertension: Secondary | ICD-10-CM | POA: Diagnosis not present

## 2017-05-22 DIAGNOSIS — E7800 Pure hypercholesterolemia, unspecified: Secondary | ICD-10-CM | POA: Diagnosis not present

## 2017-05-22 DIAGNOSIS — Z432 Encounter for attention to ileostomy: Secondary | ICD-10-CM | POA: Diagnosis not present

## 2017-05-22 DIAGNOSIS — I4891 Unspecified atrial fibrillation: Secondary | ICD-10-CM | POA: Diagnosis not present

## 2017-05-22 DIAGNOSIS — C20 Malignant neoplasm of rectum: Secondary | ICD-10-CM | POA: Diagnosis not present

## 2017-05-22 DIAGNOSIS — Z483 Aftercare following surgery for neoplasm: Secondary | ICD-10-CM | POA: Diagnosis not present

## 2017-05-25 ENCOUNTER — Telehealth: Payer: Self-pay | Admitting: Genetic Counselor

## 2017-05-25 DIAGNOSIS — Z483 Aftercare following surgery for neoplasm: Secondary | ICD-10-CM | POA: Diagnosis not present

## 2017-05-25 DIAGNOSIS — I4891 Unspecified atrial fibrillation: Secondary | ICD-10-CM | POA: Diagnosis not present

## 2017-05-25 DIAGNOSIS — Z432 Encounter for attention to ileostomy: Secondary | ICD-10-CM | POA: Diagnosis not present

## 2017-05-25 DIAGNOSIS — E7800 Pure hypercholesterolemia, unspecified: Secondary | ICD-10-CM | POA: Diagnosis not present

## 2017-05-25 DIAGNOSIS — C20 Malignant neoplasm of rectum: Secondary | ICD-10-CM | POA: Diagnosis not present

## 2017-05-25 DIAGNOSIS — I10 Essential (primary) hypertension: Secondary | ICD-10-CM | POA: Diagnosis not present

## 2017-05-25 NOTE — Telephone Encounter (Signed)
Revealed negative genetic testing.  Discussed that we do not know why he has rectal cancer or why there is cancer in the family. It could be due to a different gene that we are not testing, or maybe our current technology may not be able to pick something up.  It will be important for him to keep in contact with genetics to keep up with whether additional testing may be needed.

## 2017-05-29 ENCOUNTER — Encounter: Payer: Self-pay | Admitting: Genetic Counselor

## 2017-05-29 ENCOUNTER — Ambulatory Visit: Payer: Self-pay | Admitting: Genetic Counselor

## 2017-05-29 DIAGNOSIS — E7800 Pure hypercholesterolemia, unspecified: Secondary | ICD-10-CM | POA: Diagnosis not present

## 2017-05-29 DIAGNOSIS — I10 Essential (primary) hypertension: Secondary | ICD-10-CM | POA: Diagnosis not present

## 2017-05-29 DIAGNOSIS — Z803 Family history of malignant neoplasm of breast: Secondary | ICD-10-CM

## 2017-05-29 DIAGNOSIS — Z483 Aftercare following surgery for neoplasm: Secondary | ICD-10-CM | POA: Diagnosis not present

## 2017-05-29 DIAGNOSIS — Z1379 Encounter for other screening for genetic and chromosomal anomalies: Secondary | ICD-10-CM

## 2017-05-29 DIAGNOSIS — I4891 Unspecified atrial fibrillation: Secondary | ICD-10-CM | POA: Diagnosis not present

## 2017-05-29 DIAGNOSIS — C20 Malignant neoplasm of rectum: Secondary | ICD-10-CM | POA: Diagnosis not present

## 2017-05-29 DIAGNOSIS — Z432 Encounter for attention to ileostomy: Secondary | ICD-10-CM | POA: Diagnosis not present

## 2017-05-29 DIAGNOSIS — D126 Benign neoplasm of colon, unspecified: Secondary | ICD-10-CM

## 2017-05-29 NOTE — Progress Notes (Signed)
HPI: [name] was previously seen in the [place] clinic due to a personal and family history of cancer and concerns regarding a hereditary predisposition to cancer. Please refer to our prior cancer genetics clinic note for more information regarding [name]'s medical, social and family histories, and our assessment and recommendations, at the time. [name] recent genetic test results were disclosed to him, as were recommendations warranted by these results. These results and recommendations are discussed in more detail below.  CANCER HISTORY:    Rectal cancer ([place])   02/08/2012 Initial Diagnosis    Rectal cancer ([place])      05/11/2017 Genetic Testing    Negative genetic testing on the Multicancer gene panel.  The Multi-Gene Panel offered by [facility] includes sequencing and/or deletion duplication testing of the following 83 genes: ALK, APC, ATM, AXIN2,BAP1,  BARD1, BLM, BMPR1A, BRCA1, BRCA2, BRIP1, CASR, CDC73, CDH1, CDK4, CDKN1B, CDKN1C, CDKN2A (p14ARF), CDKN2A (p16INK4a), CEBPA, CHEK2, CTNNA1, DICER1, DIS3L2, EGFR (c.2369C>T, p.Thr790Met variant only), EPCAM (Deletion/duplication testing only), FH, FLCN, GATA2, GPC3, GREM1 (Promoter region deletion/duplication testing only), HOXB13 (c.251G>A, p.Gly84Glu), HRAS, KIT, MAX, MEN1, MET, MITF (c.952G>A, p.Glu318Lys variant only), MLH1, MSH2, MSH3, MSH6, MUTYH, NBN, NF1, NF2, NTHL1, PALB2, PDGFRA, PHOX2B, PMS2, POLD1, POLE, POT1, PRKAR1A, PTCH1, PTEN, RAD50, RAD51C, RAD51D, RB1, RECQL4, RET, RUNX1, SDHAF2, SDHA (sequence changes only), SDHB, SDHC, SDHD, SMAD4, SMARCA4, SMARCB1, SMARCE1, STK11, SUFU, TERT, TERT, TMEM127, TP53, TSC1, TSC2, VHL, WRN and WT1.  The report date is May 11, 2016.        FAMILY HISTORY:  We obtained a detailed, 4-generation family history.  Significant diagnoses are listed below: Family History  Problem Relation Age of Onset  . Hypertension Mother   . Congestive Heart Failure Mother   .  Cancer Father        multiple myeloma  . Breast cancer Daughter 41       died at 28, reportedly tested neg for breast cancer genes in 2008  . Cancer Paternal Aunt        NOS  . Heart attack Neg Hx   . Stroke Neg Hx     The patient has three children, two sons and a daughter.  His daughter developed breast cancer at 51 and died at 5.  She reportedly tested negative for hereditary breast cancer genes in 2008.  The patient is an only child.  Both parents are deceased.  His mother died of CHF at 35.  She had two brothers who were estranged and no information is known about them.  Maternal grandparents are both deceased from unknown reasons.  The patient's father died at 23 from multiple myeloma.  He had many siblings, including one sister who had an unknown cancer.  The patient feels that there are many people on his father's side with cancer, but is not sure what kind they had, who they were, or how old they were.  Both paternal grandparents are deceased.  Patient's maternal ancestors are of Caucasian descent, and paternal ancestors are of Caucasian descent. There is no reported Ashkenazi Jewish ancestry. There is no known consanguinity.  GENETIC TEST RESULTS: Genetic testing reported out on May 11, 2017 through the Multi-cancer panel found no deleterious mutations.  The Multi-Gene Panel offered by [facility] includes sequencing and/or deletion duplication testing of the following 83 genes: ALK, APC, ATM, AXIN2,BAP1,  BARD1, BLM, BMPR1A, BRCA1, BRCA2, BRIP1, CASR, CDC73, CDH1, CDK4, CDKN1B, CDKN1C, CDKN2A (p14ARF), CDKN2A (p16INK4a), CEBPA, CHEK2, CTNNA1, DICER1, DIS3L2, EGFR (c.2369C>T, p.Thr790Met  variant only), EPCAM (Deletion/duplication testing only), FH, FLCN, GATA2, GPC3, GREM1 (Promoter region deletion/duplication testing only), HOXB13 (c.251G>A, p.Gly84Glu), HRAS, KIT, MAX, MEN1, MET, MITF (c.952G>A, p.Glu318Lys variant only), MLH1, MSH2, MSH3, MSH6, MUTYH, NBN, NF1, NF2, NTHL1, PALB2,  PDGFRA, PHOX2B, PMS2, POLD1, POLE, POT1, PRKAR1A, PTCH1, PTEN, RAD50, RAD51C, RAD51D, RB1, RECQL4, RET, RUNX1, SDHAF2, SDHA (sequence changes only), SDHB, SDHC, SDHD, SMAD4, SMARCA4, SMARCB1, SMARCE1, STK11, SUFU, TERT, TERT, TMEM127, TP53, TSC1, TSC2, VHL, WRN and WT1.  The test report has been scanned into EPIC and is located under the Molecular Pathology section of the Results Review tab.   We discussed with [name] that since the current genetic testing is not perfect, it is possible there may be a gene mutation in one of these genes that current testing cannot detect, but that chance is small. We also discussed, that it is possible that another gene that has not yet been discovered, or that we have not yet tested, is responsible for the cancer diagnoses in the family, and it is, therefore, important to remain in touch with cancer genetics in the future so that we can continue to offer [name] the most up to date genetic testing.    CANCER SCREENING RECOMMENDATIONS:  This negative genetic test simply tells [place] that we cannot yet define why [name] has had an increased number of colorectal polyps. [name] medical management and screening should be based on the prospect that he will likely form more colon polyps and should, therefore, undergo more frequent colonoscopy screening at intervals determined by his GI providers.  We also recommended that [name] have an upper endoscopy periodically.  RECOMMENDATIONS FOR FAMILY MEMBERS: Women in this family might be at some increased risk of developing cancer, over the general population risk, simply due to the family history of cancer. We recommended women in this family have a yearly mammogram beginning at age 83, or 12 years younger than the earliest onset of cancer, an annual clinical breast exam, and perform monthly breast self-exams. Women in this family should also have a gynecological exam as recommended by their primary  provider. All family members should have a colonoscopy by age 76.  FOLLOW-UP: Lastly, we discussed with [name] that cancer genetics is a rapidly advancing field and it is possible that new genetic tests will be appropriate for him and/or his family members in the future. We encouraged him to remain in contact with cancer genetics on an annual basis so we can update his personal and family histories and let him know of advances in cancer genetics that may benefit this family.   Our contact number was provided. [name] questions were answered to his satisfaction, and he knows he is welcome to call us at anytime with additional questions or concerns.   Roma Kayser, MS, [facility] Certified Genetic Counselor Arthur Obrien [email or web address]

## 2017-06-01 DIAGNOSIS — I4891 Unspecified atrial fibrillation: Secondary | ICD-10-CM | POA: Diagnosis not present

## 2017-06-01 DIAGNOSIS — Z483 Aftercare following surgery for neoplasm: Secondary | ICD-10-CM | POA: Diagnosis not present

## 2017-06-01 DIAGNOSIS — I10 Essential (primary) hypertension: Secondary | ICD-10-CM | POA: Diagnosis not present

## 2017-06-01 DIAGNOSIS — E7800 Pure hypercholesterolemia, unspecified: Secondary | ICD-10-CM | POA: Diagnosis not present

## 2017-06-01 DIAGNOSIS — Z432 Encounter for attention to ileostomy: Secondary | ICD-10-CM | POA: Diagnosis not present

## 2017-06-01 DIAGNOSIS — C20 Malignant neoplasm of rectum: Secondary | ICD-10-CM | POA: Diagnosis not present

## 2017-06-07 DIAGNOSIS — I4891 Unspecified atrial fibrillation: Secondary | ICD-10-CM | POA: Diagnosis not present

## 2017-06-07 DIAGNOSIS — E7800 Pure hypercholesterolemia, unspecified: Secondary | ICD-10-CM | POA: Diagnosis not present

## 2017-06-07 DIAGNOSIS — Z432 Encounter for attention to ileostomy: Secondary | ICD-10-CM | POA: Diagnosis not present

## 2017-06-07 DIAGNOSIS — C20 Malignant neoplasm of rectum: Secondary | ICD-10-CM | POA: Diagnosis not present

## 2017-06-07 DIAGNOSIS — Z483 Aftercare following surgery for neoplasm: Secondary | ICD-10-CM | POA: Diagnosis not present

## 2017-06-07 DIAGNOSIS — I10 Essential (primary) hypertension: Secondary | ICD-10-CM | POA: Diagnosis not present

## 2017-06-13 DIAGNOSIS — C20 Malignant neoplasm of rectum: Secondary | ICD-10-CM | POA: Diagnosis not present

## 2017-06-13 DIAGNOSIS — Z483 Aftercare following surgery for neoplasm: Secondary | ICD-10-CM | POA: Diagnosis not present

## 2017-06-13 DIAGNOSIS — I4891 Unspecified atrial fibrillation: Secondary | ICD-10-CM | POA: Diagnosis not present

## 2017-06-13 DIAGNOSIS — I10 Essential (primary) hypertension: Secondary | ICD-10-CM | POA: Diagnosis not present

## 2017-06-13 DIAGNOSIS — E7800 Pure hypercholesterolemia, unspecified: Secondary | ICD-10-CM | POA: Diagnosis not present

## 2017-06-13 DIAGNOSIS — Z432 Encounter for attention to ileostomy: Secondary | ICD-10-CM | POA: Diagnosis not present

## 2017-06-16 DIAGNOSIS — R531 Weakness: Secondary | ICD-10-CM | POA: Diagnosis not present

## 2017-06-16 DIAGNOSIS — R404 Transient alteration of awareness: Secondary | ICD-10-CM | POA: Diagnosis not present

## 2017-06-17 DIAGNOSIS — I480 Paroxysmal atrial fibrillation: Secondary | ICD-10-CM | POA: Diagnosis present

## 2017-06-17 DIAGNOSIS — R1110 Vomiting, unspecified: Secondary | ICD-10-CM | POA: Diagnosis not present

## 2017-06-17 DIAGNOSIS — R112 Nausea with vomiting, unspecified: Secondary | ICD-10-CM | POA: Diagnosis not present

## 2017-06-17 DIAGNOSIS — R197 Diarrhea, unspecified: Secondary | ICD-10-CM | POA: Diagnosis not present

## 2017-06-17 DIAGNOSIS — Z932 Ileostomy status: Secondary | ICD-10-CM | POA: Diagnosis not present

## 2017-06-17 DIAGNOSIS — Z79899 Other long term (current) drug therapy: Secondary | ICD-10-CM | POA: Diagnosis not present

## 2017-06-17 DIAGNOSIS — Z7901 Long term (current) use of anticoagulants: Secondary | ICD-10-CM | POA: Diagnosis not present

## 2017-06-17 DIAGNOSIS — R531 Weakness: Secondary | ICD-10-CM | POA: Diagnosis not present

## 2017-06-17 DIAGNOSIS — E860 Dehydration: Secondary | ICD-10-CM | POA: Diagnosis present

## 2017-06-17 DIAGNOSIS — N179 Acute kidney failure, unspecified: Secondary | ICD-10-CM | POA: Diagnosis not present

## 2017-06-17 DIAGNOSIS — I10 Essential (primary) hypertension: Secondary | ICD-10-CM | POA: Diagnosis present

## 2017-06-17 DIAGNOSIS — K567 Ileus, unspecified: Secondary | ICD-10-CM | POA: Diagnosis not present

## 2017-06-17 DIAGNOSIS — A084 Viral intestinal infection, unspecified: Secondary | ICD-10-CM | POA: Diagnosis present

## 2017-06-17 DIAGNOSIS — Z85038 Personal history of other malignant neoplasm of large intestine: Secondary | ICD-10-CM | POA: Diagnosis not present

## 2017-07-20 ENCOUNTER — Ambulatory Visit (INDEPENDENT_AMBULATORY_CARE_PROVIDER_SITE_OTHER): Payer: Medicare Other | Admitting: Internal Medicine

## 2017-07-20 ENCOUNTER — Encounter: Payer: Self-pay | Admitting: Internal Medicine

## 2017-07-20 VITALS — BP 130/50 | HR 66 | Ht 71.0 in | Wt 170.0 lb

## 2017-07-20 DIAGNOSIS — R001 Bradycardia, unspecified: Secondary | ICD-10-CM

## 2017-07-20 DIAGNOSIS — I480 Paroxysmal atrial fibrillation: Secondary | ICD-10-CM | POA: Diagnosis not present

## 2017-07-20 MED ORDER — DILTIAZEM HCL ER BEADS 120 MG PO CP24: 120.0000 mg | ORAL_CAPSULE | Freq: Two times a day (BID) | ORAL | 3 refills | Status: DC

## 2017-07-20 MED ORDER — RIVAROXABAN 20 MG PO TABS: 20.0000 mg | ORAL_TABLET | Freq: Every day | ORAL | 11 refills | Status: DC

## 2017-07-20 MED ORDER — DILTIAZEM HCL 30 MG PO TABS: ORAL_TABLET | ORAL | 1 refills | Status: DC

## 2017-07-20 NOTE — Progress Notes (Signed)
Patient Care Team: Rory Percy, MD as PCP - General (Family Medicine) Deboraha Sprang, MD as Consulting Physician (Clinical Cardiac Electrophysiology)   HPI  Arthur Obrien is a 77 y.o. male Seen in followup for atrial fibrillation with a moderately rapid ventricular response in the setting of sinus bradycardia.  For thromboembolic risk factors are notable for borderline hypertension and borderline age 65 a CHADS-2 score of one  And a CHADS-VASc score of 2.    Date Cr Hgb  3/18   12.2  1/19 0.89 8.9   He underwent ileostomy with an end colostomy 1/19 for rectal cancer.  Course was complicated by recurrent atrial fibrillation with a rapid ventricular response.  Since discharge he has been having more atrial fibrillation, particularly in the nighttime.  Episodes last couple of hours.  Associate with palpitations and some lightheadedness. He takes as needed Cardizem   He snores and has daytime somnolence, this is true not withstanding an interval 30 pound weight loss  Echocardiogram 6/14 demonstrated normal left ventricular function but significant left atrial enlargement at 47 mm  Past Medical History:  Diagnosis Date  . Abdominal pain, epigastric 12/14/2016  . Colon cancer ([place]) 1992   rectal stage 3  . Colon polyps   . Colon polyps   . Family history of breast cancer   . GERD (gastroesophageal reflux disease)   . HTN (hypertension)   . Hx of echocardiogram    a.  Echo (6/14): EF 55-60%, mild-mod LAE, normal RV function, mild MR, RVSP 37 mmHg, mild pulmonary hypertension    . Hypercholesterolemia 02/08/2012  . PAF (paroxysmal atrial fibrillation) ([place]) 09/26/2012  . Rectal cancer ([place]) 02/08/2012   Stage III cancer of the rectum with resection earlier in 1992, followed by combination chemotherapy with 5-FU, leucovorin and levamisole in conjunction with radiation therapy to the pelvis by Dr. Nila Nephew, and he has had no evidence of recurrence thus far. His last  colonoscopy was by Dr. Benson Norway in November 2010 and for his next one, he would like to see Dr. Hildred Laser.  Dr. Posey Rea  . Sinus bradycardia 09/26/2012    Past Surgical History:  Procedure Laterality Date  . BIOPSY  01/10/2017   Procedure: BIOPSY;  Surgeon: Rogene Houston, MD;  [facility];  Service: Endoscopy;;  polyps @ body and fundus  . CATARACT EXTRACTION     right(years ago) left 06/2010  . COLECTOMY N/A 05/11/2017   Procedure: OPEN COMPLETION COLECTOMY WITH ILEOSTOMY;  Surgeon: Ileana Roup, MD;  [facility]: [facility];  Service: General;  Laterality: N/A;  . COLON SURGERY    . COLONOSCOPY N/A 01/10/2017   Procedure: COLONOSCOPY;  Surgeon: Rogene Houston, MD;  [facility];  Service: Endoscopy;  Laterality: N/A;  . COLONOSCOPY WITH PROPOFOL N/A 02/28/2017   Procedure: COLONOSCOPY WITH PROPOFOL WITH TATTOO;  Surgeon: Ileana Roup, MD;  Location: [facility];  Service: General;  Laterality: N/A;  . CYSTOSCOPY WITH STENT PLACEMENT N/A 05/11/2017   Procedure: CYSTOSCOPY WITH BILATERAL STENT PLACEMENT;  Surgeon: Franchot Gallo, MD;  [facility];  Service: Urology;  Laterality: N/A;  . ESOPHAGOGASTRODUODENOSCOPY N/A 01/10/2017   Procedure: ESOPHAGOGASTRODUODENOSCOPY (EGD);  Surgeon: Rogene Houston, MD;  [facility];  Service: Endoscopy;  Laterality: N/A;  12:45-rescheduled to 9/12 @ 2:00pm per Lelon Frohlich  . EYE SURGERY    . ILEOSTOMY N/A 05/11/2017   Procedure: ILEOSTOMY;  Surgeon: Ileana Roup, MD;  [facility];  Service:  General;  Laterality: N/A;  . POLYPECTOMY  01/10/2017   Procedure: POLYPECTOMY;  Surgeon: Rogene Houston, MD;  [facility];  Service: Endoscopy;;  ascending colon, transverse colon, and splenic flexure  . TONSILLECTOMY      Current Outpatient Medications  Medication Sig Dispense Refill  . acetaminophen (TYLENOL) 500 MG tablet Take 1,000 mg by mouth every 6 (six) hours as needed for mild  pain or headache.    . Cyanocobalamin 2500 MCG TABS Take 5,000 mcg by mouth daily. B12    . diltiazem (CARDIZEM) 30 MG tablet TAKE ONE TABLET BY MOUTH EVERY 4 HOURS AS NEEDED FOR HEART PALPITATIONS/ RACING. (Patient taking differently: TAKE 30 MG BY MOUTH EVERY 4 HOURS AS NEEDED FOR HEART PALPITATIONS/ RACING.) 90 tablet 1  . diltiazem (TIAZAC) 120 MG 24 hr capsule TAKE ONE CAPSULE BY MOUTH DAILY. (Patient taking differently: TAKE 120 MG BY MOUTH DAILY.) 90 capsule 1  . diphenoxylate-atropine (LOMOTIL) 2.5-0.025 MG per tablet Take 1-2 tablets PO every 6 hours PRN diarrhea (Patient taking differently: Take 2 tablets by mouth every 4 (four) hours as needed for diarrhea or loose stools. ) 100 tablet 4  . Hypromellose (ARTIFICIAL TEARS OP) Place 1 drop into both eyes every 4 (four) hours as needed (for dry eyes).     Marland Kitchen lisinopril (PRINIVIL,ZESTRIL) 5 MG tablet TAKE ONE TABLET BY MOUTH DAILY. (Patient taking differently: TAKE 5 MG BY MOUTH DAILY.) 90 tablet 1  . rivaroxaban (XARELTO) 20 MG TABS tablet Take 1 tablet (20 mg total) by mouth daily with supper. 30 tablet 6   No current facility-administered medications for this visit.     No Known Allergies  Review of Systems negative except from HPI and PMH  Physical Exam BP (!) 130/50   Pulse 66   Ht 5\' 11"  (1.803 m)   Wt 170 lb (77.1 kg)   SpO2 97%   BMI 23.71 kg/m  Well developed and nourished in no acute distress HENT normal Neck supple with JVP-flat Clear Regular rate and rhythm, no murmurs or gallops Abd-soft with active BS No Clubbing cyanosis edema Ileostomy in place Skin-warm and dry A & Oriented  Grossly normal sensory and motor function   ECG sinus at 64 Intervals 15/08/38 Otherwise normal   Assessment and  Plan  Sinus bradycardia  Atrial fibrillation-paroxysmal  Hypertension  Anemia  Sleep disordered breathing and daytime somnolence  Status post colectomy with a colostomy for rectal cancer 1/19   Increasing  episodes of atrial fibrillation particularly at night.  Not quite sure what the trigger is; he does have sleep disordered breathing and daytime somnolence  We will increase his Cardizem from 120 daily--twice daily.  So as  not to run into issues of low blood pressure, we will discontinue his lisinopril.  We discussed rhythm control versus rate control.  Initially we will try rate control.  If he has persistent symptoms upon return, would initiate flecainide  we will recheck his CBC as he was quite anemic at discharge.  Given his sleep disordered breathing and increasing atrial fibrillation burden particularly at night, we will undertake a sleep study.  We spent more than 50% of our >40 min visit in face to face counseling regarding the above

## 2017-07-20 NOTE — Patient Instructions (Addendum)
Medication Instructions:  Your physician has recommended you make the following change in your medication:   Begin taking Diltiazem 120mg  capsule, two times daily Stop taking Lisinopril  Labwork: You will have a BMP and CBC drawn today  Testing/Procedures: Your physician has recommended that you have a sleep study. This test records several body functions during sleep, including: brain activity, eye movement, oxygen and carbon dioxide blood levels, heart rate and rhythm, breathing rate and rhythm, the flow of air through your mouth and nose, snoring, body muscle movements, and chest and belly movement.    Follow-Up: Your physician recommends that you schedule a follow-up appointment in:  6 weeks with Chanetta Marshall    Any Other Special Instructions Will Be Listed Below (If Applicable).     If you need a refill on your cardiac medications before your next appointment, please call your pharmacy.

## 2017-07-21 LAB — BASIC METABOLIC PANEL
BUN/Creatinine Ratio: 15 (ref 10–24)
BUN: 15 mg/dL (ref 8–27)
CHLORIDE: 108 mmol/L — AB (ref 96–106)
CO2: 20 mmol/L (ref 20–29)
CREATININE: 0.97 mg/dL (ref 0.76–1.27)
Calcium: 9.6 mg/dL (ref 8.6–10.2)
GFR calc Af Amer: 86 mL/min/{1.73_m2} (ref >59)
GFR calc non Af Amer: 74 mL/min/{1.73_m2} (ref >59)
GLUCOSE: 92 mg/dL (ref 65–99)
Potassium: 4.4 mmol/L (ref 3.5–5.2)
SODIUM: 145 mmol/L — AB (ref 134–144)

## 2017-07-21 LAB — CBC WITH DIFFERENTIAL/PLATELET
BASOS ABS: 0 10*3/uL (ref 0.0–0.2)
Basos: 0 %
EOS (ABSOLUTE): 0.1 10*3/uL (ref 0.0–0.4)
EOS: 1 %
HEMATOCRIT: 34.5 % — AB (ref 37.5–51.0)
Hemoglobin: 11.2 g/dL — ABNORMAL LOW (ref 13.0–17.7)
IMMATURE GRANULOCYTES: 0 %
Immature Grans (Abs): 0 10*3/uL (ref 0.0–0.1)
Lymphocytes Absolute: 1.5 10*3/uL (ref 0.7–3.1)
Lymphs: 21 %
MCH: 28.9 pg (ref 26.6–33.0)
MCHC: 32.5 g/dL (ref 31.5–35.7)
MCV: 89 fL (ref 79–97)
MONOS ABS: 0.5 10*3/uL (ref 0.1–0.9)
Monocytes: 7 %
NEUTROS PCT: 71 %
Neutrophils Absolute: 4.9 10*3/uL (ref 1.4–7.0)
PLATELETS: 246 10*3/uL (ref 150–379)
RBC: 3.88 x10E6/uL — AB (ref 4.14–5.80)
RDW: 16.4 % — AB (ref 12.3–15.4)
WBC: 7 10*3/uL (ref 3.4–10.8)

## 2017-07-30 ENCOUNTER — Telehealth: Payer: Self-pay | Admitting: *Deleted

## 2017-07-30 NOTE — Telephone Encounter (Addendum)
Patient is scheduled for lab study on 08/21/17. Patient understands he sleep study will be done at [facility] sleep lab. Patient understands he will receive a sleep packet in a week or so. Patient understands to call if he does not receive the sleep packet in a timely manner. Patient agrees with treatment and thanked me for call.

## 2017-08-21 ENCOUNTER — Ambulatory Visit (HOSPITAL_BASED_OUTPATIENT_CLINIC_OR_DEPARTMENT_OTHER): Payer: Medicare Other | Attending: Internal Medicine | Admitting: Cardiology

## 2017-08-21 VITALS — Ht 71.0 in | Wt 180.0 lb

## 2017-08-21 DIAGNOSIS — R001 Bradycardia, unspecified: Secondary | ICD-10-CM

## 2017-08-21 DIAGNOSIS — G478 Other sleep disorders: Secondary | ICD-10-CM | POA: Diagnosis not present

## 2017-08-21 DIAGNOSIS — G4733 Obstructive sleep apnea (adult) (pediatric): Secondary | ICD-10-CM | POA: Insufficient documentation

## 2017-08-21 DIAGNOSIS — I480 Paroxysmal atrial fibrillation: Secondary | ICD-10-CM | POA: Diagnosis not present

## 2017-08-21 DIAGNOSIS — I10 Essential (primary) hypertension: Secondary | ICD-10-CM | POA: Diagnosis not present

## 2017-08-22 NOTE — Procedures (Signed)
   NAME: Arthur Obrien DATE OF BIRTH:  Apr 04, 1941 MEDICAL RECORD NUMBER [id number]  LOCATION: [facility]  PHYSICIAN: Traci Turner  DATE OF STUDY: 08/21/2017  SLEEP STUDY TYPE: Nocturnal Polysomnogram               REFERRING PHYSICIAN: Deboraha Sprang, MD   Gender: Male D.O.B: 02-16-1941 Age (years): 76 Referring Provider: Virl Axe Height (inches): 81 Interpreting Physician: Fransico Him MD, ABSM Weight (lbs): 180 RPSGT: Carolin Coy BMI: 25 MRN: [id number] Neck Size: 15.50  CLINICAL INFORMATION Sleep Study Type: NPSG  Indication for sleep study: Fatigue, Hypertension  Epworth Sleepiness Score: 1  SLEEP STUDY TECHNIQUE As per the AASM Manual for the Scoring of Sleep and Associated Events v2.3 (April 2016) with a hypopnea requiring 4% desaturations.  The channels recorded and monitored were frontal, central and occipital EEG, electrooculogram (EOG), submentalis EMG (chin), nasal and oral airflow, thoracic and abdominal wall motion, anterior tibialis EMG, snore microphone, electrocardiogram, and pulse oximetry.  MEDICATIONS Medications self-administered by patient taken the night of the study : N/A  SLEEP ARCHITECTURE The study was initiated at 10:02:40 PM and ended at 4:49:56 AM.  Sleep onset time was 12.0 minutes and the sleep efficiency was 67.4%%. The total sleep time was 274.5 minutes.  Stage REM latency was 127.5 minutes.  The patient spent 22.2%% of the night in stage N1 sleep, 70.7%% in stage N2 sleep, 0.0%% in stage N3 and 7.10% in REM.  Alpha intrusion was absent.  Supine sleep was 4.74%.  RESPIRATORY PARAMETERS The overall apnea/hypopnea index (AHI) was 3.1 per hour. There were 1 total apneas, including 0 obstructive, 1 central and 0 mixed apneas. There were 13 hypopneas and 211 RERAs.  The AHI during Stage REM sleep was 12.3 per hour.  AHI while supine was 18.5 per hour.  The mean oxygen saturation was 95.3%. The minimum  SpO2 during sleep was 89.0%.  soft snoring was noted during this study.  CARDIAC DATA The 2 lead EKG demonstrated sinus rhythm. The mean heart rate was 65.3 beats per minute. Other EKG findings include: PVCs.  LEG MOVEMENT DATA The total PLMS were 0 with a resulting PLMS index of 0.0. Associated arousal with leg movement index was 30.2 .  IMPRESSIONS - No significant obstructive sleep apnea occurred during this study (AHI = 3.1/h) but evidence of upper airway resistance syndrome with RDI 49/hr. - No significant central sleep apnea occurred during this study (CAI = 0.2/h). - The patient had minimal or no oxygen desaturation during the study (Min O2 = 89.0%) - The patient snored with soft snoring volume. - EKG findings include PVCs. - Clinically significant periodic limb movements did not occur during sleep. Associated arousals were significant.  DIAGNOSIS Upper Airway Resistance Syndrome  RECOMMENDATIONS - Avoid sleeping in the supine position - Avoid alcohol, sedatives and other CNS depressants that may worsen sleep apnea and disrupt normal sleep architecture. - Sleep hygiene should be reviewed to assess factors that may improve sleep quality. - Weight management and regular exercise should be initiated or continued if appropriate.  [place], American Board of Sleep Medicine  ELECTRONICALLY SIGNED ON:  08/22/2017, 6:36 PM [place] PH: [phone]   FX: [phone] [place]

## 2017-08-24 ENCOUNTER — Ambulatory Visit: Payer: Medicare Other | Admitting: Physician Assistant

## 2017-08-24 ENCOUNTER — Telehealth: Payer: Self-pay | Admitting: *Deleted

## 2017-08-24 NOTE — Telephone Encounter (Signed)
Informed patient of sleep study results and patient understanding was verbalized. Patient understands his sleep study showed no significant sleep apnea.   Pt is aware and agreeable to normal results. 

## 2017-08-24 NOTE — Telephone Encounter (Signed)
-----   Message from Sueanne Margarita, MD sent at 08/22/2017  6:38 PM EDT ----- Please let patient know that sleep study showed no significant sleep apnea.

## 2017-08-26 NOTE — Progress Notes (Signed)
Cardiology Office Note Date:  08/26/2017  Patient ID:  Arthur Obrien, Arthur Obrien [birth year], MRN [id number] PCP:  Rory Percy, MD  Electrophysiologist:  Dr. Caryl Comes    Chief Complaint: routine f/u  History of Present Illness: Arthur Obrien is a 78 y.o. male with history of PAFib, SB, HTN, HLD (the patient denies high cholesterol, states his PMD follows his labs/lipids, has never been told was high), colon and rectal ca.  He comes in to be seen for Dr. Caryl Comes, last seen by him in, March of this year.  He was s/p ileostomy with an end colostomy 1/19 for rectal cancer.  Course was complicated by recurrent atrial fibrillation with a rapid ventricular response.   Dr. Caryl Comes mentioned he has been having more atrial fibrillation, particularly in the nighttime.  Episodes last couple of hours.  Associate with palpitations and some lightheadedness. He takes as needed Cardizem   His diltiazem was increased, his lisinopril was stopped to avoid hypotension, with initial strategy to be rate control rather then rhythm control, though if persistent symptoms thought were to try Flecainide and planned for sleep study.  His sleep study did not show any significant sleep apnea, recommended sleep hygiene, weight management.  H/H looked better since post-op numbers  The patient is accompanied by his wife.  He feels "OK", no CP or SOB, no dizziness, near syncope or syncope.  He has weekly symptoms of AFib, probably a couple days a week, sometimes an hour or two, infrequently feels like it is all day or several hours.  These longer episodes is when he will take the PRN diltiazem and lay down and this seems to work.  Rarely will he consider a second PRN 30mg  dilt.  He denies any bleeding or signs of bleeding.   Past Medical History:  Diagnosis Date  . Abdominal pain, epigastric 12/14/2016  . Colon cancer ([place]) 1992   rectal stage 3  . Colon polyps   . Colon polyps   . Family history of breast cancer   . GERD  (gastroesophageal reflux disease)   . HTN (hypertension)   . Hx of echocardiogram    a.  Echo (6/14): EF 55-60%, mild-mod LAE, normal RV function, mild MR, RVSP 37 mmHg, mild pulmonary hypertension    . Hypercholesterolemia 02/08/2012  . PAF (paroxysmal atrial fibrillation) ([place]) 09/26/2012  . Rectal cancer ([place]) 02/08/2012   Stage III cancer of the rectum with resection earlier in 1992, followed by combination chemotherapy with 5-FU, leucovorin and levamisole in conjunction with radiation therapy to the pelvis by Dr. Nila Nephew, and he has had no evidence of recurrence thus far. His last colonoscopy was by Dr. Benson Norway in November 2010 and for his next one, he would like to see Dr. Hildred Laser.  Dr. Posey Rea  . Sinus bradycardia 09/26/2012    Past Surgical History:  Procedure Laterality Date  . BIOPSY  01/10/2017   Procedure: BIOPSY;  Surgeon: Rogene Houston, MD;  [facility];  Service: Endoscopy;;  polyps @ body and fundus  . CATARACT EXTRACTION     right(years ago) left 06/2010  . COLECTOMY N/A 05/11/2017   Procedure: OPEN COMPLETION COLECTOMY WITH ILEOSTOMY;  Surgeon: Ileana Roup, MD;  [facility];  Service: General;  Laterality: N/A;  . COLON SURGERY    . COLONOSCOPY N/A 01/10/2017   Procedure: COLONOSCOPY;  Surgeon: Rogene Houston, MD;  [facility];  Service: Endoscopy;  Laterality: N/A;  . COLONOSCOPY WITH  PROPOFOL N/A 02/28/2017   Procedure: COLONOSCOPY WITH PROPOFOL WITH TATTOO;  Surgeon: Ileana Roup, MD;  Location: [facility];  Service: General;  Laterality: N/A;  . CYSTOSCOPY WITH STENT PLACEMENT N/A 05/11/2017   Procedure: CYSTOSCOPY WITH BILATERAL STENT PLACEMENT;  Surgeon: Franchot Gallo, MD;  [facility];  Service: Urology;  Laterality: N/A;  . ESOPHAGOGASTRODUODENOSCOPY N/A 01/10/2017   Procedure: ESOPHAGOGASTRODUODENOSCOPY (EGD);  Surgeon: Rogene Houston, MD;  [facility];  Service: Endoscopy;   Laterality: N/A;  12:45-rescheduled to 9/12 @ 2:00pm per Lelon Frohlich  . EYE SURGERY    . ILEOSTOMY N/A 05/11/2017   Procedure: ILEOSTOMY;  Surgeon: Ileana Roup, MD;  [facility];  Service: General;  Laterality: N/A;  . POLYPECTOMY  01/10/2017   Procedure: POLYPECTOMY;  Surgeon: Rogene Houston, MD;  [facility];  Service: Endoscopy;;  ascending colon, transverse colon, and splenic flexure  . TONSILLECTOMY      Current Outpatient Medications  Medication Sig Dispense Refill  . acetaminophen (TYLENOL) 500 MG tablet Take 1,000 mg by mouth every 6 (six) hours as needed for mild pain or headache.    . Cyanocobalamin 2500 MCG TABS Take 5,000 mcg by mouth daily. B12    . diltiazem (CARDIZEM) 30 MG tablet TAKE ONE TABLET BY MOUTH EVERY 4 HOURS AS NEEDED FOR HEART PALPITATIONS/ RACING. 90 tablet 1  . diltiazem (TIAZAC) 120 MG 24 hr capsule Take 1 capsule (120 mg total) by mouth 2 (two) times daily. TAKE 120 MG BY MOUTH DAILY. 180 capsule 3  . diphenoxylate-atropine (LOMOTIL) 2.5-0.025 MG per tablet Take 1-2 tablets PO every 6 hours PRN diarrhea (Patient taking differently: Take 2 tablets by mouth every 4 (four) hours as needed for diarrhea or loose stools. ) 100 tablet 4  . Hypromellose (ARTIFICIAL TEARS OP) Place 1 drop into both eyes every 4 (four) hours as needed (for dry eyes).     . rivaroxaban (XARELTO) 20 MG TABS tablet Take 1 tablet (20 mg total) by mouth daily with supper. 30 tablet 11   No current facility-administered medications for this visit.     Allergies:   Patient has no known allergies.   Social History:  The patient  reports that he has never smoked. He has never used smokeless tobacco. He reports that he does not drink alcohol or use drugs.   Family History:  The patient's family history includes Breast cancer (age of onset: 50) in his daughter; Cancer in his father and paternal aunt; Congestive Heart Failure in his mother; Hypertension in his mother.  ROS:   Please see the history of present illness. All other systems are reviewed and otherwise negative.   PHYSICAL EXAM:  VS:  There were no vitals taken for this visit. BMI: There is no height or weight on file to calculate BMI. Well nourished, well developed, in no acute distress  HEENT: normocephalic, atraumatic  Neck: no JVD, carotid bruits or masses Cardiac:  RRR; no significant murmurs are appreciated no rubs, or gallops Lungs:  CTA b/l, no wheezing, rhonchi or rales  Abd: soft, non-tender, ileostomy Right MS: no deformity, age appropriate atrophy Ext:   no edema  Skin: warm and dry, no rash Neuro:  No gross deficits appreciated Psych: euthymic mood, full affect   EKG:  Not done today  06/26/12: TTE LVEF 55-60%, LA mild-mod dilated Mild AV calcification, mild AI Mild MR RV 88mmHg Mild p.HTN   Recent Labs: 04/02/2017: ALT 64 05/16/2017: Magnesium 1.7 07/20/2017: BUN  15; Creatinine, Ser 0.97; Hemoglobin 11.2; Platelets 246; Potassium 4.4; Sodium 145  No results found for requested labs within last 8760 hours.   CrCl cannot be calculated (Patient's most recent lab result is older than the maximum 21 days allowed.).   Wt Readings from Last 3 Encounters:  08/21/17 180 lb (81.6 kg)  07/20/17 170 lb (77.1 kg)  05/11/17 172 lb (78 kg)     Other studies reviewed: Additional studies/records reviewed today include: summarized above  ASSESSMENT AND PLAN:  1. Paroxysmal Afib     CHA2DS2Vasc is at least 3, on Xarelto     No symptoms of bradycardia  + AFib symptoms.  He reports with the longer episodes he feels fatigued liked he has been working hard, infrequently does he find the rates fast, he says it will jump up to 160-170 range though not for more then seconds or a minute or so.  He does not feel poorly, and it quickly comes back to 90-110 range, typically he finds his AFib 90's.    Lengthy discussion on AFib management strategies.  Persistent rapid rates and potential affects  this may have on heart function, though he does not feel his HR is usually so fast in AF.  AS well as rhythm vs rate control.  Discussed adding Flecainide to his dilt that Dr. Caryl Comes discussed.  At this time, he would rather not make any changes.  He does nt think he is sufficiently bithered by his AF and is more leary/concerned about more medicines then he is of the AFib.   We discussed if he is having more persistent or rates become >100bpm routinely/persistantly I would like him to call for an Afib clinic appointment to revisit meds, he is comfortable with this plan and is given the information for the clinic.  Otherwise will see him back in 4 mo, sooner if needed.       2. HTN     Looks OK, no changes    Disposition: as above    Current medicines are reviewed at length with the patient today.  The patient did not have any concerns regarding medicines.  Venetia Night, PA-C 08/26/2017 4:27 PM     [address] [place] [address] [phone] (office)  [phone] (fax)

## 2017-08-29 ENCOUNTER — Ambulatory Visit (INDEPENDENT_AMBULATORY_CARE_PROVIDER_SITE_OTHER): Payer: Medicare Other | Admitting: Physician Assistant

## 2017-08-29 ENCOUNTER — Encounter: Payer: Self-pay | Admitting: Physician Assistant

## 2017-08-29 VITALS — BP 130/62 | HR 64 | Ht 71.0 in | Wt 179.0 lb

## 2017-08-29 DIAGNOSIS — I480 Paroxysmal atrial fibrillation: Secondary | ICD-10-CM | POA: Diagnosis not present

## 2017-08-29 DIAGNOSIS — I10 Essential (primary) hypertension: Secondary | ICD-10-CM

## 2017-08-29 NOTE — Patient Instructions (Signed)
Medication Instructions:   Your physician recommends that you continue on your current medications as directed. Please refer to the Current Medication list given to you today.   If you need a refill on your cardiac medications before your next appointment, please call your pharmacy.  Labwork:  NONE ORDERED  TODAY    Testing/Procedures: NONE ORDERED  TODAY    Follow-Up:  4 MONTHS WITH URSUY OR KLEIN    Any Other Special Instructions Will Be Listed Below (If Applicable).

## 2017-09-30 DIAGNOSIS — Z85038 Personal history of other malignant neoplasm of large intestine: Secondary | ICD-10-CM | POA: Diagnosis not present

## 2017-09-30 DIAGNOSIS — Z7901 Long term (current) use of anticoagulants: Secondary | ICD-10-CM | POA: Diagnosis not present

## 2017-09-30 DIAGNOSIS — K2970 Gastritis, unspecified, without bleeding: Secondary | ICD-10-CM | POA: Diagnosis not present

## 2017-09-30 DIAGNOSIS — I4891 Unspecified atrial fibrillation: Secondary | ICD-10-CM | POA: Diagnosis not present

## 2017-09-30 DIAGNOSIS — Z933 Colostomy status: Secondary | ICD-10-CM | POA: Diagnosis not present

## 2017-09-30 DIAGNOSIS — B9681 Helicobacter pylori [H. pylori] as the cause of diseases classified elsewhere: Secondary | ICD-10-CM | POA: Diagnosis not present

## 2017-09-30 DIAGNOSIS — Z79899 Other long term (current) drug therapy: Secondary | ICD-10-CM | POA: Diagnosis not present

## 2017-10-23 DIAGNOSIS — I480 Paroxysmal atrial fibrillation: Secondary | ICD-10-CM | POA: Diagnosis not present

## 2017-10-23 DIAGNOSIS — B9681 Helicobacter pylori [H. pylori] as the cause of diseases classified elsewhere: Secondary | ICD-10-CM | POA: Diagnosis not present

## 2017-10-23 DIAGNOSIS — Z6825 Body mass index (BMI) 25.0-25.9, adult: Secondary | ICD-10-CM | POA: Diagnosis not present

## 2017-10-24 DIAGNOSIS — R1013 Epigastric pain: Secondary | ICD-10-CM | POA: Diagnosis not present

## 2017-10-24 DIAGNOSIS — B9681 Helicobacter pylori [H. pylori] as the cause of diseases classified elsewhere: Secondary | ICD-10-CM | POA: Diagnosis not present

## 2017-11-23 DIAGNOSIS — H01002 Unspecified blepharitis right lower eyelid: Secondary | ICD-10-CM | POA: Diagnosis not present

## 2017-11-23 DIAGNOSIS — H01001 Unspecified blepharitis right upper eyelid: Secondary | ICD-10-CM | POA: Diagnosis not present

## 2017-11-23 DIAGNOSIS — H00021 Hordeolum internum right upper eyelid: Secondary | ICD-10-CM | POA: Diagnosis not present

## 2017-11-23 DIAGNOSIS — H02052 Trichiasis without entropian right lower eyelid: Secondary | ICD-10-CM | POA: Diagnosis not present

## 2017-11-23 DIAGNOSIS — H00022 Hordeolum internum right lower eyelid: Secondary | ICD-10-CM | POA: Diagnosis not present

## 2017-12-20 IMAGING — CT CT T SPINE W/O CM
2 of 6 series · 10 of 20 positions shown, 12 images · non-contrast
Comparison: None.

CLINICAL DATA: Plan for colectomy for multiple unresectable polyps.
History of colon cancer in 8110.

EXAM:
CT THORACIC SPINE WITHOUT CONTRAST
TECHNIQUE: Multidetector CT images of the thoracic were obtained using the
standard protocol without intravenous contrast.

[Series 3: t spine bone · axial · 0.38mm/px · z∈[-212,+26]mm · 5 of 143 slices shown, 7 images]
[im 24/143  soft-tissue]
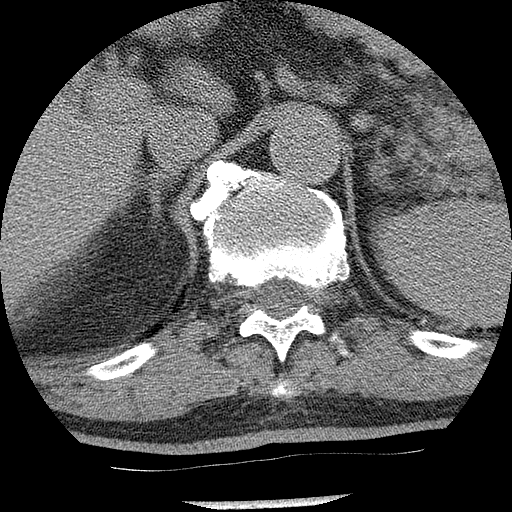
[im 24/143  bone]
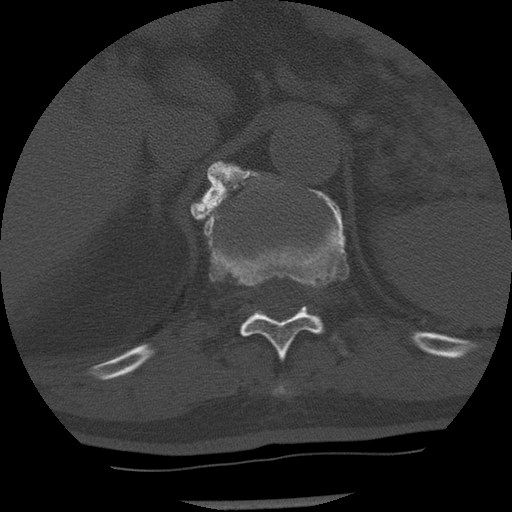
[im 48/143  bone]
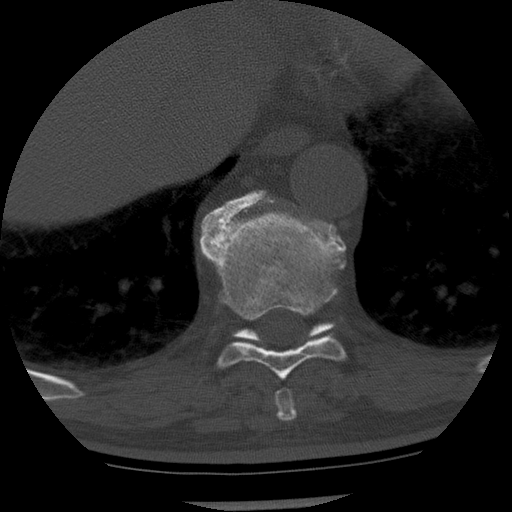
[im 72/143  bone]
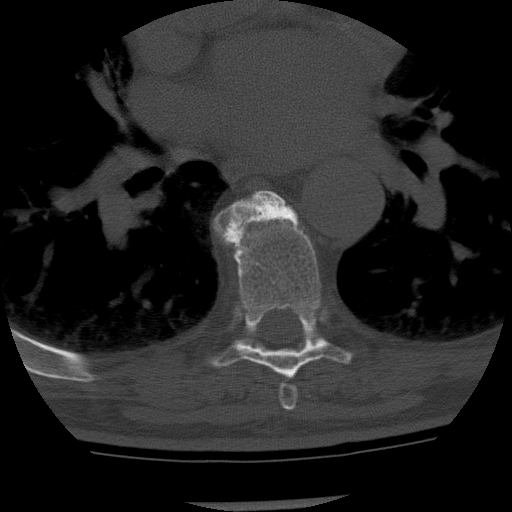
[im 95/143  bone]
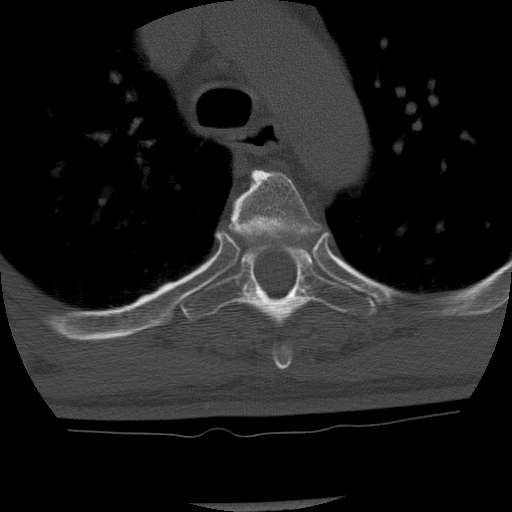
[im 119/143  soft-tissue]
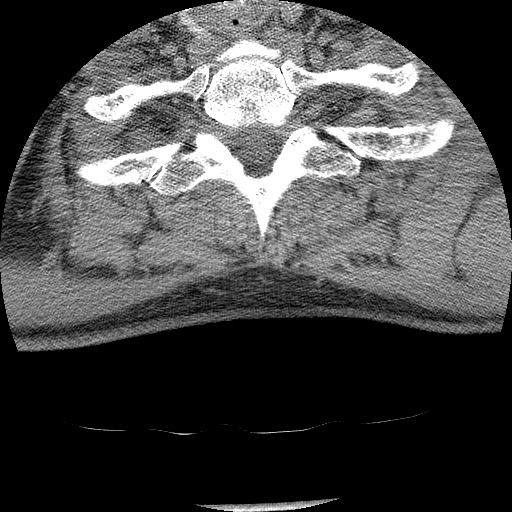
[im 119/143  bone]
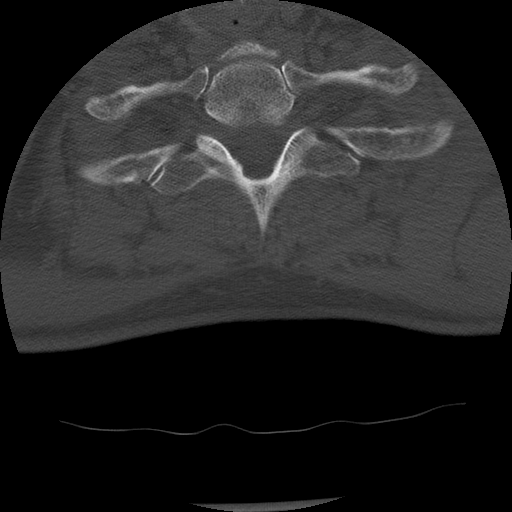

[Series 4: t spine soft · axial · 0.38mm/px · z∈[-212,+26]mm · 5 of 143 slices shown]
[im 24/143  soft-tissue]
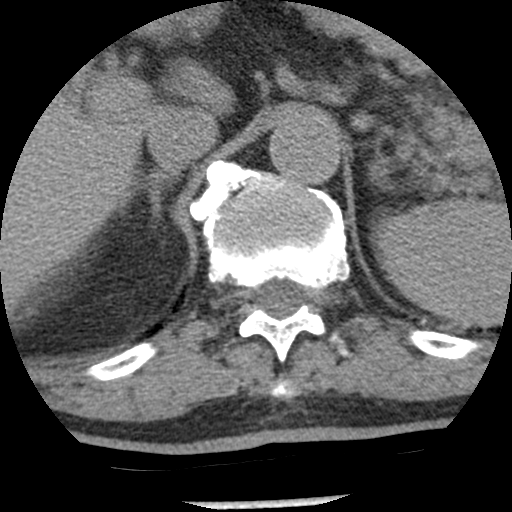
[im 48/143  soft-tissue]
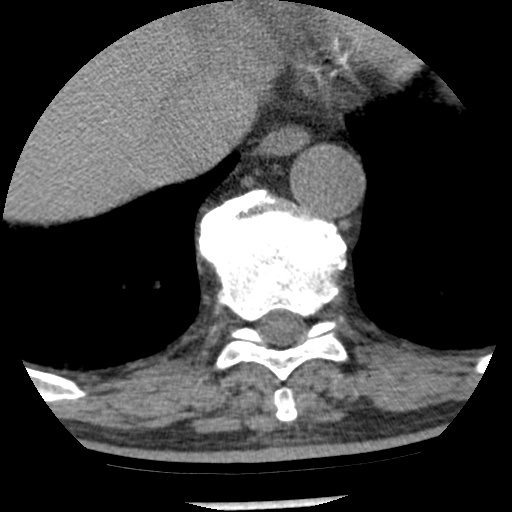
[im 72/143  soft-tissue]
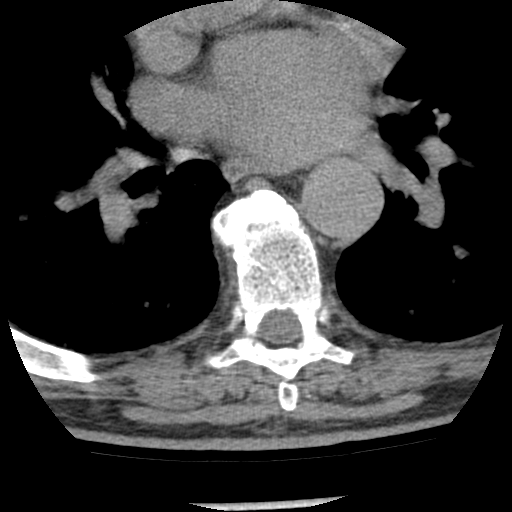
[im 95/143  soft-tissue]
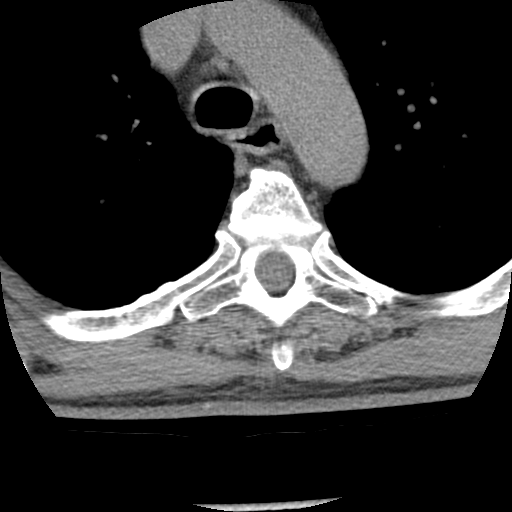
[im 119/143  soft-tissue]
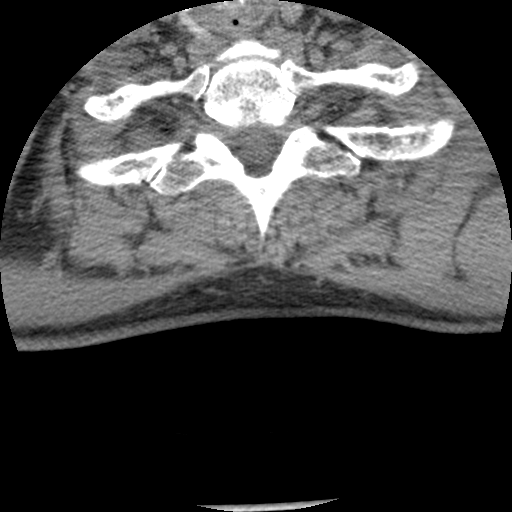

[10 of 20 positions shown; findings below may reference images not displayed]

FINDINGS: Alignment: Exaggerated thoracic kyphosis.

Vertebrae: No evidence of metastatic disease. No fracture deformity
or evidence of discitis.

Paraspinal and other soft tissues: Chest CT reported separately. No
paraspinal or spinal canal mass noted.

Disc levels: Spondylosis with bulkiest osteophytes from T4-T12,
where the disc spaces are also the most narrowed. Osteophytes cause
multilevel ankylosis. Small partly calcified disc protrusion at
T10-11. Symmetric foraminal patency.
IMPRESSION: 1. No evidence of metastatic disease to the thoracic spine.
2. Multilevel thoracic spondylosis and degenerative disc narrowing.
No evidence of cord impingement.

## 2018-01-02 DIAGNOSIS — H26493 Other secondary cataract, bilateral: Secondary | ICD-10-CM | POA: Diagnosis not present

## 2018-01-09 ENCOUNTER — Ambulatory Visit (INDEPENDENT_AMBULATORY_CARE_PROVIDER_SITE_OTHER): Payer: Medicare Other | Admitting: Internal Medicine

## 2018-01-09 ENCOUNTER — Encounter: Payer: Self-pay | Admitting: Internal Medicine

## 2018-01-09 VITALS — BP 140/66 | HR 56 | Ht 71.0 in | Wt 187.6 lb

## 2018-01-09 DIAGNOSIS — I10 Essential (primary) hypertension: Secondary | ICD-10-CM | POA: Diagnosis not present

## 2018-01-09 DIAGNOSIS — R001 Bradycardia, unspecified: Secondary | ICD-10-CM | POA: Diagnosis not present

## 2018-01-09 DIAGNOSIS — I480 Paroxysmal atrial fibrillation: Secondary | ICD-10-CM | POA: Diagnosis not present

## 2018-01-09 LAB — BASIC METABOLIC PANEL
BUN / CREAT RATIO: 12 (ref 10–24)
BUN: 13 mg/dL (ref 8–27)
CHLORIDE: 104 mmol/L (ref 96–106)
CO2: 21 mmol/L (ref 20–29)
Calcium: 9.4 mg/dL (ref 8.6–10.2)
Creatinine, Ser: 1.06 mg/dL (ref 0.76–1.27)
GFR calc non Af Amer: 66 mL/min/{1.73_m2} (ref >59)
GFR, EST AFRICAN AMERICAN: 77 mL/min/{1.73_m2} (ref >59)
GLUCOSE: 94 mg/dL (ref 65–99)
POTASSIUM: 4.6 mmol/L (ref 3.5–5.2)
SODIUM: 140 mmol/L (ref 134–144)

## 2018-01-09 LAB — CBC
Hematocrit: 35.8 % — ABNORMAL LOW (ref 37.5–51.0)
Hemoglobin: 11.9 g/dL — ABNORMAL LOW (ref 13.0–17.7)
MCH: 28.1 pg (ref 26.6–33.0)
MCHC: 33.2 g/dL (ref 31.5–35.7)
MCV: 84 fL (ref 79–97)
PLATELETS: 225 10*3/uL (ref 150–450)
RBC: 4.24 x10E6/uL (ref 4.14–5.80)
RDW: 16.3 % — AB (ref 12.3–15.4)
WBC: 6.1 10*3/uL (ref 3.4–10.8)

## 2018-01-09 NOTE — Progress Notes (Signed)
Patient Care Team: Rory Percy, MD as PCP - General (Family Medicine) Deboraha Sprang, MD as Consulting Physician (Clinical Cardiac Electrophysiology)   HPI  Arthur Obrien is a 77 y.o. male Seen in followup for atrial fibrillation with a moderately rapid ventricular response in the setting of sinus bradycardia.  For thromboembolic risk factors are notable for borderline hypertension and borderline age 37 a CHADS-2 score of one  And a CHADS-VASc score of 2.     Date Cr Hgb  3/18   12.2  1/19 0.89 8.9  3/19 0.97 11.2       He underwent ileostomy with an end colostomy 1/19 for rectal cancer.  Course was complicated by recurrent atrial fibrillation with a rapid ventricular response.  Since discharge he has been having more atrial fibrillation, particularly in the nighttime. Added dilt, but declined rhythm control at last visit 3/19   DATE TEST EF   6/14 /Echo   65 % LAE 47        Overall much less atrial fibrillation.  Energy level is better than a few months ago but has leveled out.  Not able to keep up with his wife.  No chest pain or shortness of breath but fatigue.  No edema.  Notes on his heart rate monitor that with exertion heart rates are into the mid to high 80s.  Atrial fibrillation rates over 100  Apparently had blood work with his PCP a few weeks ago.  Thromboembolic risk factors ( age  -2, HTN-1, ) for a CHADSVASc Score of 3   Past Medical History:  Diagnosis Date  . Abdominal pain, epigastric 12/14/2016  . Colon cancer ([place]) 1992   rectal stage 3  . Colon polyps   . Colon polyps   . Family history of breast cancer   . GERD (gastroesophageal reflux disease)   . HTN (hypertension)   . Hx of echocardiogram    a.  Echo (6/14): EF 55-60%, mild-mod LAE, normal RV function, mild MR, RVSP 37 mmHg, mild pulmonary hypertension    . Hypercholesterolemia 02/08/2012  . PAF (paroxysmal atrial fibrillation) ([place]) 09/26/2012  . Rectal cancer ([place]) 02/08/2012   Stage III  cancer of the rectum with resection earlier in 1992, followed by combination chemotherapy with 5-FU, leucovorin and levamisole in conjunction with radiation therapy to the pelvis by Dr. Nila Nephew, and he has had no evidence of recurrence thus far. His last colonoscopy was by Dr. Benson Norway in November 2010 and for his next one, he would like to see Dr. Hildred Laser.  Dr. Posey Rea  . Sinus bradycardia 09/26/2012    Past Surgical History:  Procedure Laterality Date  . BIOPSY  01/10/2017   Procedure: BIOPSY;  Surgeon: Rogene Houston, MD;  [facility];  Service: Endoscopy;;  polyps @ body and fundus  . CATARACT EXTRACTION     right(years ago) left 06/2010  . COLECTOMY N/A 05/11/2017   Procedure: OPEN COMPLETION COLECTOMY WITH ILEOSTOMY;  Surgeon: Ileana Roup, MD;  Location: [facility];  Service: General;  Laterality: N/A;  . COLON SURGERY    . COLONOSCOPY N/A 01/10/2017   Procedure: COLONOSCOPY;  Surgeon: Rogene Houston, MD;  [facility];  Service: Endoscopy;  Laterality: N/A;  . COLONOSCOPY WITH PROPOFOL N/A 02/28/2017   Procedure: COLONOSCOPY WITH PROPOFOL WITH TATTOO;  Surgeon: Ileana Roup, MD;  Location: [facility];  Service: General;  Laterality: N/A;  . CYSTOSCOPY WITH STENT PLACEMENT N/A 05/11/2017  Procedure: CYSTOSCOPY WITH BILATERAL STENT PLACEMENT;  Surgeon: Franchot Gallo, MD;  Location: [facility];  Service: Urology;  Laterality: N/A;  . ESOPHAGOGASTRODUODENOSCOPY N/A 01/10/2017   Procedure: ESOPHAGOGASTRODUODENOSCOPY (EGD);  Surgeon: Rogene Houston, MD;  [facility];  Service: Endoscopy;  Laterality: N/A;  12:45-rescheduled to 9/12 @ 2:00pm per Lelon Frohlich  . EYE SURGERY    . ILEOSTOMY N/A 05/11/2017   Procedure: ILEOSTOMY;  Surgeon: Ileana Roup, MD;  Location: [facility];  Service: General;  Laterality: N/A;  . POLYPECTOMY  01/10/2017   Procedure: POLYPECTOMY;  Surgeon: Rogene Houston, MD;  [facility];  Service:  Endoscopy;;  ascending colon, transverse colon, and splenic flexure  . TONSILLECTOMY      Current Outpatient Medications  Medication Sig Dispense Refill  . acetaminophen (TYLENOL) 500 MG tablet Take 1,000 mg by mouth every 6 (six) hours as needed for mild pain or headache.    . Cyanocobalamin 2500 MCG TABS Take 5,000 mcg by mouth daily. B12    . diltiazem (CARDIZEM) 30 MG tablet TAKE ONE TABLET BY MOUTH EVERY 4 HOURS AS NEEDED FOR HEART PALPITATIONS/ RACING. 90 tablet 1  . diltiazem (TIAZAC) 120 MG 24 hr capsule Take 1 capsule (120 mg total) by mouth 2 (two) times daily. TAKE 120 MG BY MOUTH DAILY. 180 capsule 3  . diphenoxylate-atropine (LOMOTIL) 2.5-0.025 MG per tablet Take 1-2 tablets PO every 6 hours PRN diarrhea 100 tablet 4  . Hypromellose (ARTIFICIAL TEARS OP) Place 1 drop into both eyes every 4 (four) hours as needed (for dry eyes).     Marland Kitchen loperamide (IMODIUM A-D) 2 MG tablet Take 2 mg by mouth as needed for diarrhea or loose stools.    . rivaroxaban (XARELTO) 20 MG TABS tablet Take 1 tablet (20 mg total) by mouth daily with supper. 30 tablet 11   No current facility-administered medications for this visit.     No Known Allergies  Review of Systems negative except from HPI and PMH  Physical Exam Ht 5\' 11"  (1.803 m)   Wt 187 lb 9.6 oz (85.1 kg)   BMI 26.16 kg/m  Well developed and nourished in no acute distress HENT normal Neck supple with JVP-flat Clear Regular rate and rhythm, no murmurs or gallops Abd-soft with active BS No Clubbing cyanosis edema Skin-warm and dry A & Oriented  Grossly normal sensory and motor function   ECG sinus at 56   16/09/40  otherwise normal   Assessment and  Plan  Sinus bradycardia  Atrial fibrillation-paroxysmal  Hypertension  Anemia  Sleep disordered breathing and daytime somnolence  Status post colectomy with a colostomy for rectal cancer 1/19   Atrial fibrillation is relatively quiescient  On Anticoagulation;  No  bleeding issues   Sinus bradycardia seems to be a nonissue with heart rate excursion demonstrated by his watch  Fatigue is concerning; we will check bleeding labs Blood pressure well controlled

## 2018-01-09 NOTE — Patient Instructions (Signed)
Medication Instructions:  Your physician recommends that you continue on your current medications as directed. Please refer to the Current Medication list given to you today.  Labwork: You will have labs drawn today: CBC and BMP  Testing/Procedures: None ordered.  Follow-Up: Your physician wants you to follow-up in: One Year with Dr Caryl Comes. You will receive a reminder letter in the mail two months in advance. If you don't receive a letter, please call our office to schedule the follow-up appointment.   Any Other Special Instructions Will Be Listed Below (If Applicable).     If you need a refill on your cardiac medications before your next appointment, please call your pharmacy.

## 2018-03-25 DIAGNOSIS — Z23 Encounter for immunization: Secondary | ICD-10-CM | POA: Diagnosis not present

## 2018-03-26 ENCOUNTER — Ambulatory Visit: Payer: Self-pay | Admitting: Surgery

## 2018-03-26 DIAGNOSIS — Z8719 Personal history of other diseases of the digestive system: Secondary | ICD-10-CM | POA: Diagnosis not present

## 2018-03-26 DIAGNOSIS — Z9049 Acquired absence of other specified parts of digestive tract: Secondary | ICD-10-CM | POA: Diagnosis not present

## 2018-03-26 NOTE — H&P (Signed)
CC: Long term f/u - hx of endoscopically unresectable polyps  Arthur Obrien has a hx of HTN, HLD, AFib on Xarelto - referred to me by Dr. Redmond Pulling for surgical management of endoscopically unresectable colon polyps.  He has a history of rectal cancer and underwent a LAR with colorectal anastomosis in 1992 followed by adjuvant chemoradiation therapy - this anastomosis was quite low and palpable on digital rectal exam. He has been told he was cured of this and in remission. He underwent a high-risk screening colonoscopy 01/10/17 with Dr. Laural Golden. There were multiple tubular adenomas found in the colon on the colonoscopy - polyps are found in the ascending colon, hepatic flexure, transverse colon and splenic flexure. By documentation there was a 1-2cm polyp in the proximal transverse colon which was carpet-like and incompletely removed - this was not tattooed; clips were placed. An additional greater than 5cm polyp was found in the splenic flexure and partially removed, this one reportedly was tattoo'd. He saw Dr. Redmond Pulling in the office. He subsequently had a CT enterography for intermittent bloating and diarrhea which showed at least a partial small bowel obstruction with a potential transition point. His history was that of intermittent partial small bowel obstruction. At baseline, he has issues with loose stool requiring lomotil and has issues with fecal incontinence.  He reports 1-2 episodes per week where he has incontinence of stool particularly when the stool is liquid and has an accident in his underwear. He does not wear diapers but does changes his daily activities if he is concerned that his stools became more liquid because of fear of incontinence.  I repeated his colonoscopy and ensured marking of both large partially resected polyps. Additional polyps noted which were small and left in situ as the other larger polyps were endoscopically unretrievable and he would need colectomy to address - these  polyps were in the vicinity of the hepatic flexure and proximal descending colon/splenic flexure. Given the fact that he is had a history of rectal cancer and now numerous polyps at least 2 of which are endoscopically unretrievable and in separate segments of colon total proctocolectomy was therefore recommended. A segmental resection was felt to be inadequate given the polyp burden, history, and polyp locations which would require a very large segmental resection. After performing this there would certainly be concerned regarding the blood supply to his remaining neorectum. He has additionally been seen by our genetic counselors and has additional testing pending for purposes of informing his children.  OR 05/11/17 for total proctocolectomy with end ileostomy, small bowel resection, lysis of adhesions time 60 minutes. He recovered well postoperatively and was discharged 05/18/17 after having restarted his anticoagulation  His pathology returned with tubular adenomas with focal high-grade dysplasia. Additionally, 5 to adenomas were found. 0 22 lymph nodes. No invasive carcinoma identified. Resection margins are negative for dysplasia or malignancy. A segment of small bowel also removed and pathology on this returned chronic tach active ileitis. This was at the site of a radiation associated stricture/fibrosis  INTERVAL HX He has been doing well at home. He has been functioning well with his ileostomy. He denies any issues with it. He isn't pouching well. He has been eating well. The output has been well-controlled off antidiarrheals. His weight has been stable.  The patient is a 77 year old male.   Allergies (Tanisha A. Owens Shark, Roland; 03/26/2018 2:07 PM) No Known Drug Allergies [02/01/2017]: Allergies Reconciled   Medication History (Tanisha A. Owens Shark, RMA; 03/26/2018 2:08 PM) DilTIAZem  HCl ER Beads (120MG  Capsule ER 24HR, Oral) Active. Lisinopril (5MG  Tablet, Oral) Active. Xarelto  (20MG  Tablet, Oral) Active. Pantoprazole Sodium (40MG  Tablet DR, Oral) Active. IBU (800MG  Tablet, Oral) Active. Cyanocobalamin (1000MCG Tablet, Oral) Active. Lomotil (2.5-0.025MG  Tablet, Oral) Active. Simethicone (125MG  Capsule, Oral) Active. TraMADol HCl (50MG  Tablet, Oral) Active. Medications Reconciled    Review of Systems Harrell Gave M. Olan Kurek MD; 03/26/2018 2:55 PM) General Not Present- Appetite Loss, Chills and Fever. HEENT Not Present- Blurred Vision and Diplopia. Respiratory Not Present- Difficulty Breathing on Exertion and Wheezing. Cardiovascular Not Present- Chest Pain and Shortness of Breath. Gastrointestinal Not Present- Abdominal Pain, Nausea and Vomiting. Musculoskeletal Not Present- Decreased Range of Motion and Joint Pain. Psychiatric Present- Change in Sleep Pattern. Not Present- Anxiety and Depression. Endocrine Not Present- Appetite Changes and Cold Intolerance. Hematology Not Present- Abnormal Bleeding and Blood Clots.  Vitals (Tanisha A. Brown RMA; 03/26/2018 2:07 PM) 03/26/2018 2:07 PM Weight: 190.6 lb Height: 70in Body Surface Area: 2.05 m Body Mass Index: 27.35 kg/m  Temp.: 97.50F  Pulse: 60 (Regular)  BP: 132/86 (Sitting, Left Arm, Standard)       Physical Exam Harrell Gave M. Demaria Deeney MD; 03/26/2018 2:56 PM) The physical exam findings are as follows: Note:Constitutional: No acute distress; conversant; no deformities Eyes: Moist conjunctiva; no lid lag; anicteric sclerae; pupils equal round and reactive to light Neck: Trachea midline; no palpable thyromegaly Lungs: Normal respiratory effort; no tactile fremitus CV: Regular rate and rhythm; no palpable thrill; no pitting edema GI: Ileostomy pink and protruding nicely; output in appliance toothpaste consistency. Abdomen soft, nontender, nondistended; no palpable hepatosplenomegaly MSK: Normal gait; no clubbing/cyanosis Psychiatric: Appropriate affect; alert and oriented  3 Lymphatic: No palpable cervical or axillary lymphadenopathy    Assessment & Plan Harrell Gave M. Lavelle Berland MD; 03/26/2018 2:58 PM) S/P TOTAL COLECTOMY (Z90.49) Story: Arthur Obrien is a very pleasant 50yoM s/p completion total proctocolectomy with small bowel resection and end ileostomy 05/11/17 for endoscopically unresectable polyps; hx of prior low anterior resection. His anastomosis was ligated at that a near total proctocolectomy as necessary to remove the affected segments of colon. He now has an end ileostomy. 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 -Genetics returned negative as well Impression: -He is doing quite well -Given remnant rectum in place, we discussed importance of surveillance. We will plan flex sig of the rectal cuff/ATZ. We will request a cardiology clearance for planned regarding anticoagulation around the time of this procedure  Signed electronically by Ileana Roup, MD (03/26/2018 2:58 PM)

## 2018-04-02 ENCOUNTER — Telehealth: Payer: Self-pay | Admitting: *Deleted

## 2018-04-02 NOTE — Telephone Encounter (Signed)
Pt takes Xarelto for afib with CHADS2VASc score of 3 (age x2, HTN). Renal function is normal. Ok to hold Xarelto for 2 days prior to procedure. 

## 2018-04-02 NOTE — Telephone Encounter (Signed)
Routing to pharmacy.  Elver Stadler C. Myrka Sylva, RN, ANP-C [facility] [address] [place], [place]  [address] [phone]  

## 2018-04-02 NOTE — Telephone Encounter (Signed)
   [facility] Pre-operative Risk Assessment    Request for surgical clearance:  1. What type of surgery is being performed? FLEX SIGMOID OF THE RECTAL CUFF/ATZ   2. When is this surgery scheduled? TBD   3. Are there any medications that need to be held prior to surgery and how long? XARELTO   4. Practice name and name of physician performing surgery? [facility]; DR. Harrell Gave WHITE   5. What is your office phone and fax number? PH# [phone]; FAX# [phone] ATTN: [name], RMA    6. Anesthesia type (None, local, MAC, general) ? GENERAL    Julaine Hua 04/02/2018, 9:23 AM  _________________________________________________________________   (provider comments below)

## 2018-04-25 NOTE — Patient Instructions (Addendum)
Arthur Obrien  04/25/2018   Your procedure is scheduled on: 05-02-18  Report to [facility] Main  Entrance  Report to admitting at  1215pm    Call this number if you have problems the morning of surgery [phone]   Remember: Do not eat food  :After Midnight. Clear liquids from midnight until 815 am then nothing by mouth after 815 am day of surgery. BRUSH YOUR TEETH MORNING OF SURGERY AND RINSE YOUR MOUTH OUT, NO CHEWING GUM CANDY OR MINTS.     CLEAR LIQUID DIET   Foods Allowed                                                                     Foods Excluded  Coffee and tea, regular and decaf                             liquids that you cannot  Plain Jell-O in any flavor                                             see through such as: Fruit ices (not with fruit pulp)                                     milk, soups, orange juice  Iced Popsicles                                    All solid food Carbonated beverages, regular and diet                                    Cranberry, grape and apple juices Sports drinks like Gatorade Lightly seasoned clear broth or consume(fat free) Sugar, honey syrup  Sample Menu Breakfast                                Lunch                                     Supper Cranberry juice                    Beef broth                            Chicken broth Jell-O                                     Grape juice  Apple juice Coffee or tea                        Jell-O                                      Popsicle                                                Coffee or tea                        Coffee or tea  _____________________________________________________________________     Take these medicines the morning of surgery with A SIP OF WATER: diltiazem if needed, diltiazem (tiazac), eye drops if needed                               You may not have any metal on your body including hair pins and             piercings  Do not wear jewelry, make-up, lotions, powders or perfumes, deodorant             Do not wear nail polish.  Do not shave  48 hours prior to surgery.              Men may shave face and neck.   Do not bring valuables to the hospital. [place].  Contacts, dentures or bridgework may not be worn into surgery.  Leave suitcase in the car. After surgery it may be brought to your room.     Patients discharged the day of surgery will not be allowed to drive home.  Name and phone number of your driver:WIFE DRIVER CELL [phone]  Special Instructions: N/A              Please read over the following fact sheets you were given: _____________________________________________________________________  [facility] - Preparing for Surgery Before surgery, you can play an important role.  Because skin is not sterile, your skin needs to be as free of germs as possible.  You can reduce the number of germs on your skin by washing with CHG (chlorahexidine gluconate) soap before surgery.  CHG is an antiseptic cleaner which kills germs and bonds with the skin to continue killing germs even after washing. Please DO NOT use if you have an allergy to CHG or antibacterial soaps.  If your skin becomes reddened/irritated stop using the CHG and inform your nurse when you arrive at Short Stay. Do not shave (including legs and underarms) for at least 48 hours prior to the first CHG shower.  You may shave your face/neck. Please follow these instructions carefully:  1.  Shower with CHG Soap the night before surgery and the  morning of Surgery.  2.  If you choose to wash your hair, wash your hair first as usual with your  normal  shampoo.  3.  After you shampoo, rinse your hair and body thoroughly to remove the  shampoo.  4.  Use CHG as you would any other liquid soap.  You can apply chg directly  to the skin and wash                        Gently with a scrungie or clean washcloth.  5.  Apply the CHG Soap to your body ONLY FROM THE NECK DOWN.   Do not use on face/ open                           Wound or open sores. Avoid contact with eyes, ears mouth and genitals (private parts).                       Wash face,  Genitals (private parts) with your normal soap.             6.  Wash thoroughly, paying special attention to the area where your surgery  will be performed.  7.  Thoroughly rinse your body with warm water from the neck down.  8.  DO NOT shower/wash with your normal soap after using and rinsing off  the CHG Soap.                9.  Pat yourself dry with a clean towel.            10.  Wear clean pajamas.            11.  Place clean sheets on your bed the night of your first shower and do not  sleep with pets. Day of Surgery : Do not apply any lotions/deodorants the morning of surgery.  Please wear clean clothes to the hospital/surgery center.  FAILURE TO FOLLOW THESE INSTRUCTIONS MAY RESULT IN THE CANCELLATION OF YOUR SURGERY PATIENT SIGNATURE_________________________________  NURSE SIGNATURE__________________________________  ________________________________________________________________________

## 2018-04-26 ENCOUNTER — Encounter (HOSPITAL_COMMUNITY): Payer: Self-pay

## 2018-04-26 ENCOUNTER — Encounter (HOSPITAL_COMMUNITY): Payer: Self-pay | Admitting: Physician Assistant

## 2018-04-26 ENCOUNTER — Encounter (HOSPITAL_COMMUNITY)
Admission: RE | Admit: 2018-04-26 | Discharge: 2018-04-26 | Disposition: A | Discharge status: Home / Self Care | Payer: Medicare Other | Source: Ambulatory Visit | Attending: Surgery | Admitting: Surgery

## 2018-04-26 DIAGNOSIS — Z01812 Encounter for preprocedural laboratory examination: Secondary | ICD-10-CM | POA: Insufficient documentation

## 2018-04-26 HISTORY — DX: Other sleep disorders: G47.8

## 2018-04-26 LAB — CBC
HCT: 37.8 % — ABNORMAL LOW (ref 39.0–52.0)
Hemoglobin: 11.8 g/dL — ABNORMAL LOW (ref 13.0–17.0)
MCH: 28.8 pg (ref 26.0–34.0)
MCHC: 31.2 g/dL (ref 30.0–36.0)
MCV: 92.2 fL (ref 80.0–100.0)
Platelets: 203 10*3/uL (ref 150–400)
RBC: 4.1 MIL/uL — ABNORMAL LOW (ref 4.22–5.81)
RDW: 13.5 % (ref 11.5–15.5)
WBC: 6.1 10*3/uL (ref 4.0–10.5)
nRBC: 0 % (ref 0.0–0.2)

## 2018-04-26 LAB — BASIC METABOLIC PANEL
Anion gap: 7 (ref 5–15)
BUN: 15 mg/dL (ref 8–23)
CO2: 24 mmol/L (ref 22–32)
Calcium: 8.8 mg/dL — ABNORMAL LOW (ref 8.9–10.3)
Chloride: 109 mmol/L (ref 98–111)
Creatinine, Ser: 1.05 mg/dL (ref 0.61–1.24)
GFR calc Af Amer: >60 mL/min (ref >60)
GFR calc non Af Amer: >60 mL/min (ref >60)
Glucose, Bld: 99 mg/dL (ref 70–99)
POTASSIUM: 4.1 mmol/L (ref 3.5–5.1)
Sodium: 140 mmol/L (ref 135–145)

## 2018-04-26 LAB — SURGICAL PCR SCREEN
MRSA, PCR: NEGATIVE
Staphylococcus aureus: NEGATIVE

## 2018-04-29 NOTE — Progress Notes (Signed)
Anesthesia Chart Review   Case:  [id number] Date/Time:  05/02/18 1400   Procedure:  FLEXIBLE SIGMOIDOSCOPY (N/A )   Anesthesia type:  Moderate Sedation   Pre-op diagnosis:  History of colon polyps   Location:  [facility]OM 02 / [facility]   Surgeon:  Ileana Roup, MD      DISCUSSION:77 yo never smoker with h/o HTN, HLD, A-fib (on Xarelto), GERD, rectal cancer, colon cancer who is scheduled for flexible sigmoidoscopy on 05/02/18 with Dr. Nadeen Landau.    Pt underwent sleep study due to increased daytime somnolence and increased episodes of A-fib 08/21/17 with no significant OSA occurrences, diagnosed with upper airway resistance syndrome.  Pt last seen by cardiology on 01/09/18, Dr. Virl Axe.  At this visit Dr. Olin Pia note reports A-fib is stable, on Xarelto, BP well controlled.  Truitt Merle, NP with Cardiology made aware of upcoming procedure, routed information to pharmacy who agreed with plan for pt to hold Xarelto for 2 days prior to procedure.   Pt can proceed with planned procedure barring acute status change.   VS: BP (!) 151/75   Pulse (!) 59   Temp 36.9 C (Oral)   Ht 5\' 10"  (1.778 m)   Wt 84.8 kg   SpO2 100%   BMI 26.83 kg/m   PROVIDERS: Rory Percy, MD is PCP  Virl Axe, MD is Cardiologist   LABS: Labs reviewed: Acceptable for surgery. (all labs ordered are listed, but only abnormal results are displayed)  Labs Reviewed  BASIC METABOLIC PANEL - Abnormal; Notable for the following components:      Result Value   Calcium 8.8 (*)    All other components within normal limits  CBC - Abnormal; Notable for the following components:   RBC 4.10 (*)    Hemoglobin 11.8 (*)    HCT 37.8 (*)    All other components within normal limits  SURGICAL PCR SCREEN    IMAGES:   EKG 01/09/18: Rate 56bpm Sinus bradycardia Otherwise normal ECG  CV: Echo 06/26/12:  1. EF 55-60%. Global wall motion and contractility are within normal limits. 2. LA mildly to moderately  dilated. 3. RV global systolic function normal.  4. No PFO 5. Mild aortic leaflet calcification. No evidence aortic regurgitation. 6. Mild mitral regurgitation. 7. No evidence of tricuspid valve regurgitation. 8. RV systolic pressure calculated at 37 mmHg.  9. Evidence of mild pulmonary hypertension. 10. No evidence pulmonic valve thickening. 11. No pericardial effusion. 12. Inferior vena cava appears normal in size. > 50% respiratory change in inferior vena cava dimensions  Past Medical History:  Diagnosis Date  . Abdominal pain, epigastric 12/14/2016  . Colon cancer ([place]) 1992   rectal stage 3  . Colon polyps   . Colon polyps   . Family history of breast cancer   . GERD (gastroesophageal reflux disease)   . HTN (hypertension)   . Hx of echocardiogram    a.  Echo (6/14): EF 55-60%, mild-mod LAE, normal RV function, mild MR, RVSP 37 mmHg, mild pulmonary hypertension    . Hypercholesterolemia 02/08/2012  . PAF (paroxysmal atrial fibrillation) ([place]) 09/26/2012  . Rectal cancer ([place]) 02/08/2012   Stage III cancer of the rectum with resection earlier in 1992, followed by combination chemotherapy with 5-FU, leucovorin and levamisole in conjunction with radiation therapy to the pelvis by Dr. Nila Nephew, and he has had no evidence of recurrence thus far. His last colonoscopy was by Dr. Benson Norway in November 2010 and for his  next one, he would like to see Dr. Hildred Laser.  Dr. Posey Rea  . Sinus bradycardia 09/26/2012  . UARS (upper airway resistance syndrome)    08-21-17 SLEEP STUDY NO CPAP NEEDED    Past Surgical History:  Procedure Laterality Date  . BIOPSY  01/10/2017   Procedure: BIOPSY;  Surgeon: Rogene Houston, MD;  [facility];  Service: Endoscopy;;  polyps @ body and fundus  . CATARACT EXTRACTION Bilateral    right(years ago) left 06/2010  . COLECTOMY N/A 05/11/2017   Procedure: OPEN COMPLETION COLECTOMY WITH ILEOSTOMY;  Surgeon: Ileana Roup, MD;   [facility];  Service: General;  Laterality: N/A;  . COLON SURGERY    . COLONOSCOPY N/A 01/10/2017   Procedure: COLONOSCOPY;  Surgeon: Rogene Houston, MD;  [facility];  Service: Endoscopy;  Laterality: N/A;  . COLONOSCOPY WITH PROPOFOL N/A 02/28/2017   Procedure: COLONOSCOPY WITH PROPOFOL WITH TATTOO;  Surgeon: Ileana Roup, MD;  [facility];  Service: General;  Laterality: N/A;  . CYSTOSCOPY WITH STENT PLACEMENT N/A 05/11/2017   Procedure: CYSTOSCOPY WITH BILATERAL STENT PLACEMENT;  Surgeon: Franchot Gallo, MD;  [facility];  Service: Urology;  Laterality: N/A;  . ESOPHAGOGASTRODUODENOSCOPY N/A 01/10/2017   Procedure: ESOPHAGOGASTRODUODENOSCOPY (EGD);  Surgeon: Rogene Houston, MD;  [facility];  Service: Endoscopy;  Laterality: N/A;  12:45-rescheduled to 9/12 @ 2:00pm per Lelon Frohlich  . ILEOSTOMY N/A 05/11/2017   Procedure: ILEOSTOMY;  Surgeon: Ileana Roup, MD;  [facility];  Service: General;  Laterality: N/A;  . POLYPECTOMY  01/10/2017   Procedure: POLYPECTOMY;  Surgeon: Rogene Houston, MD;  [facility];  Service: Endoscopy;;  ascending colon, transverse colon, and splenic flexure  . TONSILLECTOMY  AGE 77    MEDICATIONS: . acetaminophen (TYLENOL) 500 MG tablet  . Cyanocobalamin 2500 MCG TABS  . diltiazem (CARDIZEM) 30 MG tablet  . diltiazem (TIAZAC) 120 MG 24 hr capsule  . Hypromellose (ARTIFICIAL TEARS OP)  . rivaroxaban (XARELTO) 20 MG TABS tablet   No current facility-administered medications for this encounter.    Maia Plan [facility] Pre-Surgical Testing [phone] 04/29/18 12:32 PM

## 2018-04-29 NOTE — Anesthesia Preprocedure Evaluation (Deleted)
Anesthesia Evaluation    Airway        Dental   Pulmonary           Cardiovascular hypertension,      Neuro/Psych    GI/Hepatic   Endo/Other    Renal/GU      Musculoskeletal   Abdominal   Peds  Hematology   Anesthesia Other Findings   Reproductive/Obstetrics                             Anesthesia Physical Anesthesia Plan  ASA:   Anesthesia Plan:    Post-op Pain Management:    Induction:   PONV Risk Score and Plan:   Airway Management Planned:   Additional Equipment:   Intra-op Plan:   Post-operative Plan:   Informed Consent:   Plan Discussed with:   Anesthesia Plan Comments: (See PST note 04/26/18 Konrad Felix, PA-C)        Anesthesia Quick Evaluation

## 2018-05-02 ENCOUNTER — Encounter (HOSPITAL_COMMUNITY)
Admission: RE | Disposition: A | Discharge status: Home / Self Care | Payer: Self-pay | Source: Home / Self Care | Attending: Surgery

## 2018-05-02 ENCOUNTER — Other Ambulatory Visit: Payer: Self-pay

## 2018-05-02 ENCOUNTER — Encounter (HOSPITAL_COMMUNITY): Payer: Self-pay | Admitting: Emergency Medicine

## 2018-05-02 ENCOUNTER — Ambulatory Visit (HOSPITAL_COMMUNITY)
Admission: RE | Admit: 2018-05-02 | Discharge: 2018-05-02 | Disposition: A | Discharge status: Home / Self Care | Payer: Medicare Other | Attending: Surgery | Admitting: Surgery

## 2018-05-02 DIAGNOSIS — Z9049 Acquired absence of other specified parts of digestive tract: Secondary | ICD-10-CM | POA: Insufficient documentation

## 2018-05-02 DIAGNOSIS — Z9221 Personal history of antineoplastic chemotherapy: Secondary | ICD-10-CM | POA: Insufficient documentation

## 2018-05-02 DIAGNOSIS — I10 Essential (primary) hypertension: Secondary | ICD-10-CM | POA: Diagnosis not present

## 2018-05-02 DIAGNOSIS — Z86010 Personal history of colonic polyps: Secondary | ICD-10-CM | POA: Insufficient documentation

## 2018-05-02 DIAGNOSIS — Z85048 Personal history of other malignant neoplasm of rectum, rectosigmoid junction, and anus: Secondary | ICD-10-CM | POA: Insufficient documentation

## 2018-05-02 DIAGNOSIS — I272 Pulmonary hypertension, unspecified: Secondary | ICD-10-CM | POA: Diagnosis not present

## 2018-05-02 DIAGNOSIS — Z7901 Long term (current) use of anticoagulants: Secondary | ICD-10-CM | POA: Diagnosis not present

## 2018-05-02 DIAGNOSIS — Z980 Intestinal bypass and anastomosis status: Secondary | ICD-10-CM | POA: Insufficient documentation

## 2018-05-02 DIAGNOSIS — Z923 Personal history of irradiation: Secondary | ICD-10-CM | POA: Insufficient documentation

## 2018-05-02 DIAGNOSIS — Z09 Encounter for follow-up examination after completed treatment for conditions other than malignant neoplasm: Secondary | ICD-10-CM | POA: Diagnosis not present

## 2018-05-02 DIAGNOSIS — I480 Paroxysmal atrial fibrillation: Secondary | ICD-10-CM | POA: Insufficient documentation

## 2018-05-02 DIAGNOSIS — Z932 Ileostomy status: Secondary | ICD-10-CM | POA: Insufficient documentation

## 2018-05-02 HISTORY — PX: FLEXIBLE SIGMOIDOSCOPY: SHX5431

## 2018-05-02 SURGERY — SIGMOIDOSCOPY, FLEXIBLE
Anesthesia: Moderate Sedation

## 2018-05-02 MED ORDER — MIDAZOLAM HCL 2 MG/2ML IJ SOLN
INTRAMUSCULAR | Status: DC | PRN
  Administered 2018-05-02: 2 mg via INTRAVENOUS

## 2018-05-02 MED ORDER — SODIUM CHLORIDE 0.9 % IV SOLN: INTRAVENOUS | Status: DC

## 2018-05-02 MED ORDER — LACTATED RINGERS IV SOLN
INTRAVENOUS | Status: DC
  Administered 2018-05-02: 13:00:00 via INTRAVENOUS

## 2018-05-02 MED ORDER — MIDAZOLAM HCL (PF) 5 MG/ML IJ SOLN
INTRAMUSCULAR | Status: AC
  Filled 2018-05-02: qty 2

## 2018-05-02 MED ORDER — FENTANYL CITRATE (PF) 100 MCG/2ML IJ SOLN
INTRAMUSCULAR | Status: DC | PRN
  Administered 2018-05-02: 25 ug via INTRAVENOUS

## 2018-05-02 MED ORDER — FENTANYL CITRATE (PF) 100 MCG/2ML IJ SOLN
INTRAMUSCULAR | Status: AC
  Filled 2018-05-02: qty 2

## 2018-05-02 NOTE — Op Note (Signed)
[facility] Patient Name: Arthur Obrien Procedure Date: 05/02/2018 MRN: [id number] Attending MD: Ileana Roup MD, MD Date of Birth: 1941/02/21 CSN: [id number] Age: 78 Admit Type: Outpatient Procedure:                Flexible Sigmoidoscopy Indications:              High risk colon cancer surveillance: Personal                            history of colonic polyps, High risk colon cancer                            surveillance: Personal history of colon cancer Providers:                Sharon Mt. Vivion Romano MD, MD, Cleda Daub, RN,                            Charolette Child, Technician Referring MD:              Medicines:                Fentanyl 25 micrograms IV, Midazolam 2 mg IV Complications:            No immediate complications. Estimated Blood Loss:     Estimated blood loss: none. Procedure:                Pre-Anesthesia Assessment:                           - Prior to the procedure, a History and Physical                            was performed, and patient medications, allergies                            and sensitivities were reviewed. The patient's                            tolerance of previous anesthesia was reviewed.                           - The risks and benefits of the procedure and the                            sedation options and risks were discussed with the                            patient. All questions were answered and informed                            consent was obtained.                           - Patient identification and proposed procedure  were verified prior to the procedure by the                            physician, the nurse and the technician. The                            procedure was verified in the pre-procedure area in                            the endoscopy suite.                           - Pre-procedure physical examination revealed no                            contraindications to  sedation.                           - ASA Grade Assessment: II - A patient with mild                            systemic disease.                           - The anesthesia plan was to use moderate                            sedation/analgesia (conscious sedation).                           After obtaining informed consent, the scope was                            passed under direct vision. The CF-HQ190L ([id number])                            Olympus Adult Colonoscope was introduced through                            the anus and advanced to the the rectum. The                            flexible sigmoidoscopy was accomplished without                            difficulty. The patient tolerated the procedure                            well. The quality of the bowel preparation was                            adequate. Scope In: Scope Out: Findings:      The perianal and digital examinations were normal. Staple line palpable       on exam - 2-3 cm from anal verge.  The mucosa vascular pattern in the rectum was locally consisent with       mild diversion proctopathy.      The exam was otherwise without abnormality. Impression:               - Consisent with mild diversion proctopathy mucosa                            vascular pattern in the rectum.                           - The examination was otherwise normal.                           - No specimens collected. Moderate Sedation:      Moderate (conscious) sedation was administered by the endoscopy nurse       and supervised by the endoscopist. The patient's oxygen saturation,       heart rate, blood pressure and response to care were monitored. Total       physician intraservice time was 7 minutes. Recommendation:           - Continue present medications.                           - Repeat flexible sigmoidoscopy in 3 years for                            surveillance. Procedure Code(s):        --- Professional ---                            [phone], 3, Sigmoidoscopy, flexible; diagnostic,                            including collection of specimen(s) by brushing or                            washing, when performed (separate procedure) Diagnosis Code(s):        --- Professional ---                           Z86.010, Personal history of colonic polyps                           Z85.038, Personal history of other malignant                            neoplasm of large intestine CPT copyright 2018 American Medical Association. All rights reserved. The codes documented in this report are preliminary and upon coder review may  be revised to meet current compliance requirements. Nadeen Landau, MD Ileana Roup MD, MD 05/02/2018 1:49:03 PM This report has been signed electronically. Number of Addenda: 0

## 2018-05-02 NOTE — Discharge Instructions (Signed)
Flexible Sigmoidoscopy, Care After  This sheet gives you information about how to care for yourself after your procedure. Your health care provider may also give you more specific instructions. If you have problems or questions, contact your health care provider.  What can I expect after the procedure?  After the procedure, it is common to have:  · Abdominal cramping or pain.  · Bloating.  · A small amount of rectal bleeding if you had a biopsy.  Follow these instructions at home:  · Take over-the-counter and prescription medicines only as told by your health care provider.  · Do not drive for 24 hours if you received a medicine to help you relax (sedative).  · Keep all follow-up visits as told by your health care provider. This is important.  Contact a health care provider if:  · You have abdominal pain or cramping that gets worse or is not helped with medicine.  · You continue to have small amounts of rectal bleeding after 24 hours.  · You have nausea or vomiting.  · You feel weak or dizzy.  · You have a fever.  Get help right away if:  · You pass large blood clots or see a large amount of blood in the toilet after having a bowel movement.  · You have nausea or vomiting for more than 24 hours after the procedure.  This information is not intended to replace advice given to you by your health care provider. Make sure you discuss any questions you have with your health care provider.  Document Released: 04/22/2013 Document Revised: 11/05/2015 Document Reviewed: 07/17/2015  Elsevier Interactive Patient Education © 2019 Elsevier Inc[employer]

## 2018-05-02 NOTE — H&P (Signed)
CC: Here today for flexible sigmoidoscopy  HPI: Mr. Lingelbach has a hx of HTN, HLD, AFib on Xarelto - initially referred to me by Dr. Redmond Pulling for surgical management of endoscopically unresectable colon polyps.  He had a history of rectal cancer and underwent a LAR with colorectal anastomosis in 1992 followed by adjuvant chemoradiation therapy - this anastomosis was quite low and palpable on digital rectal exam. He has been told he was cured of this and in remission. He underwent a high-risk screening colonoscopy 01/10/17 with Dr. Laural Golden. There were multiple tubular adenomas found in the colon on the colonoscopy - polyps are found in the ascending colon, hepatic flexure, transverse colon and splenic flexure. By documentation there was a 1-2cm polyp in the proximal transverse colon which was carpet-like and incompletely removed - this was not tattooed; clips were placed. An additional greater than 5cm polyp was found in the splenic flexure and partially removed, this one reportedly was tattoo'd. He saw Dr. Redmond Pulling in the office. He subsequently had a CT enterography for intermittent bloating and diarrhea which showed at least a partial small bowel obstruction with a potential transition point. His history was that of intermittent partial small bowel obstruction. At baseline, he has issues with loose stool requiring lomotil and has issues with fecal incontinence.  He reported 1-2 episodes per week where he had incontinence of stool particularly when the stool is liquid and has an accident in his underwear. He did not wear diapers but does changes his daily activities if he is concerned that his stools became more liquid because of fear of incontinence.  I repeated his colonoscopy and ensured marking of both large partially resected polyps. Additional polyps noted which were small and left in situ as the other larger polyps were endoscopically unretrievable and he would need colectomy to address - these  polyps were in the vicinity of the hepatic flexure and proximal descending colon/splenic flexure. Given the fact that he is had a history of rectal cancer and now numerous polyps at least 2 of which are endoscopically unretrievable and in separate segments of colon completion proctocolectomy was recommended. A segmental resection was felt to be inadequate given the polyp burden, history, and polyp locations which would require a very large segmental resection. After performing this there would certainly be concerned regarding the blood supply to his remaining neorectum. His genetics evaluation returned negative  He was taken to the OR 05/11/17 for completion proctocolectomy with end ileostomy, small bowel resection, lysis of adhesions time 60 minutes. A small remnant rectum was left in situ just below the previous anastomosis given the amount of fibrosis in his pelvis.He recovered well postoperatively and was discharged 05/18/17 after having restarted his anticoagulation  His pathology returned with tubular adenomas with focal high-grade dysplasia. Additionally, 5 to adenomas were found. 0 22 lymph nodes. No invasive carcinoma identified. Resection margins are negative for dysplasia or malignancy. A segment of small bowel also removed and pathology on this returned chronic tach active ileitis. This was at the site of a radiation associated stricture/fibrosis  INTERVAL HX He has been doing well at home. He has been functioning well with his ileostomy. He denies any issues with it. He isn't pouching well. He has been eating well. The output has been well-controlled off antidiarrheals. His weight has been stable. He is here today for flexible sigmoidoscopy  Past Medical History:  Diagnosis Date  . Abdominal pain, epigastric 12/14/2016  . Colon cancer ([place]) 1992   rectal stage 3  .  Colon polyps   . Colon polyps   . Family history of breast cancer   . GERD (gastroesophageal reflux disease)    . HTN (hypertension)   . Hx of echocardiogram    a.  Echo (6/14): EF 55-60%, mild-mod LAE, normal RV function, mild MR, RVSP 37 mmHg, mild pulmonary hypertension    . Hypercholesterolemia 02/08/2012  . PAF (paroxysmal atrial fibrillation) ([place]) 09/26/2012  . Rectal cancer ([place]) 02/08/2012   Stage III cancer of the rectum with resection earlier in 1992, followed by combination chemotherapy with 5-FU, leucovorin and levamisole in conjunction with radiation therapy to the pelvis by Dr. Nila Nephew, and he has had no evidence of recurrence thus far. His last colonoscopy was by Dr. Benson Norway in November 2010 and for his next one, he would like to see Dr. Hildred Laser.  Dr. Posey Rea  . Sinus bradycardia 09/26/2012  . UARS (upper airway resistance syndrome)    08-21-17 SLEEP STUDY NO CPAP NEEDED    Past Surgical History:  Procedure Laterality Date  . BIOPSY  01/10/2017   Procedure: BIOPSY;  Surgeon: Rogene Houston, MD;  [facility];  Service: Endoscopy;;  polyps @ body and fundus  . CATARACT EXTRACTION Bilateral    right(years ago) left 06/2010  . COLECTOMY N/A 05/11/2017   Procedure: OPEN COMPLETION COLECTOMY WITH ILEOSTOMY;  Surgeon: Ileana Roup, MD;  [facility];  Service: General;  Laterality: N/A;  . COLON SURGERY    . COLONOSCOPY N/A 01/10/2017   Procedure: COLONOSCOPY;  Surgeon: Rogene Houston, MD;  [facility];  Service: Endoscopy;  Laterality: N/A;  . COLONOSCOPY WITH PROPOFOL N/A 02/28/2017   Procedure: COLONOSCOPY WITH PROPOFOL WITH TATTOO;  Surgeon: Ileana Roup, MD;  [facility];  Service: General;  Laterality: N/A;  . CYSTOSCOPY WITH STENT PLACEMENT N/A 05/11/2017   Procedure: CYSTOSCOPY WITH BILATERAL STENT PLACEMENT;  Surgeon: Franchot Gallo, MD;  [facility];  Service: Urology;  Laterality: N/A;  . ESOPHAGOGASTRODUODENOSCOPY N/A 01/10/2017   Procedure: ESOPHAGOGASTRODUODENOSCOPY (EGD);  Surgeon: Rogene Houston, MD;  [facility];  Service: Endoscopy;  Laterality: N/A;  12:45-rescheduled to 9/12 @ 2:00pm per Lelon Frohlich  . ILEOSTOMY N/A 05/11/2017   Procedure: ILEOSTOMY;  Surgeon: Ileana Roup, MD;  [facility];  Service: General;  Laterality: N/A;  . POLYPECTOMY  01/10/2017   Procedure: POLYPECTOMY;  Surgeon: Rogene Houston, MD;  [facility];  Service: Endoscopy;;  ascending colon, transverse colon, and splenic flexure  . TONSILLECTOMY  AGE 52    Family History  Problem Relation Age of Onset  . Hypertension Mother   . Congestive Heart Failure Mother   . Cancer Father        multiple myeloma  . Breast cancer Daughter 17       died at 38, reportedly tested neg for breast cancer genes in 2008  . Cancer Paternal Aunt        NOS  . Heart attack Neg Hx   . Stroke Neg Hx     Social:  reports that he has never smoked. He has never used smokeless tobacco. He reports that he does not drink alcohol or use drugs.  Allergies: No Known Allergies  Medications: I have reviewed the patient's current medications.  No results found for this or any previous visit (from the past 48 hour(s)).  No results found.  ROS - all of the below systems have been reviewed with the  patient and positives are indicated with bold text General: chills, fever or night sweats Eyes: blurry vision or double vision ENT: epistaxis or sore throat Allergy/Immunology: itchy/watery eyes or nasal congestion Hematologic/Lymphatic: bleeding problems, blood clots or swollen lymph nodes Endocrine: temperature intolerance or unexpected weight changes Breast: new or changing breast lumps or nipple discharge Resp: cough, shortness of breath, or wheezing CV: chest pain or dyspnea on exertion GI: as per HPI GU: dysuria, trouble voiding, or hematuria MSK: joint pain or joint stiffness Neuro: TIA or stroke symptoms Derm: pruritus and skin lesion changes Psych: anxiety and depression  PE Blood  pressure (!) 152/55, pulse (!) 54, temperature 97.6 F (36.4 C), temperature source Oral, resp. rate 13, height _0  (1.778 m), weight 84.8 kg, SpO2 99 %. Constitutional: NAD; conversant; no deformities Eyes: Moist conjunctiva; no lid lag; anicteric; PERRL Neck: Trachea midline; no thyromegaly Lungs: Normal respiratory effort; no tactile fremitus CV: RRR; no palpable thrills; no pitting edema GI: Abd soft, NT/ND; ileostomy pink and protruding nicely - R hemiabdomen; no palpable hepatosplenomegaly MSK: Normal gait; no clubbing/cyanosis Psychiatric: Appropriate affect; alert and oriented x3 Lymphatic: No palpable cervical or axillary lymphadenopathy  A/P: Arthur Obrien is an 78 y.o. male with hx of rectal cancer, endoscopically unresectable polyps, baseline issues with FI - underwent LAR 1992, adjuvant cXRT; completion proctocolectomy 05/11/17 for endoscopically unresectable polyps - remnant rectal stump in situ. Here today for surveillance flexible sigmoidoscopy  -Given remnant rectum in place, we discussed importance of surveillance. We will plan flex sig of the rectal cuff/ATZ.  -The planned procedure, material risks (including but not limited to pain, bleeding, perforation of rectum, need for additional procedures, heart attack, pneumonia, stroke, death) benefits and alternatives were discussed. His questions were answered to his satisfaction and he elected to proceed with the procedure  Sharon Mt. Dema Severin, M.D. General and Colorectal Surgery [facility]

## 2018-05-03 ENCOUNTER — Encounter (HOSPITAL_COMMUNITY): Payer: Self-pay | Admitting: Surgery

## 2018-06-24 ENCOUNTER — Other Ambulatory Visit: Payer: Self-pay | Admitting: Internal Medicine

## 2018-07-29 ENCOUNTER — Other Ambulatory Visit: Payer: Self-pay | Admitting: Internal Medicine

## 2018-07-30 NOTE — Telephone Encounter (Signed)
Pt last saw Dr Caryl Comes 01/09/18, last labs 04/26/18 Creat 1.05, age 78, weight 85.1kg, CrCl 68.67, based on CrCl pt is on appropriate dosage of Xarelto 20mg  QD.  Will refill rx.

## 2019-01-14 ENCOUNTER — Ambulatory Visit: Payer: Medicare Other | Admitting: Internal Medicine

## 2019-02-24 ENCOUNTER — Ambulatory Visit: Payer: Medicare Other | Admitting: Internal Medicine

## 2019-03-12 DIAGNOSIS — Z23 Encounter for immunization: Secondary | ICD-10-CM | POA: Diagnosis not present

## 2019-03-17 ENCOUNTER — Other Ambulatory Visit: Payer: Self-pay

## 2019-03-17 ENCOUNTER — Ambulatory Visit (INDEPENDENT_AMBULATORY_CARE_PROVIDER_SITE_OTHER): Payer: Medicare Other | Admitting: Internal Medicine

## 2019-03-17 ENCOUNTER — Encounter: Payer: Self-pay | Admitting: Internal Medicine

## 2019-03-17 VITALS — BP 126/68 | HR 92 | Ht 70.0 in | Wt 191.0 lb

## 2019-03-17 DIAGNOSIS — R001 Bradycardia, unspecified: Secondary | ICD-10-CM | POA: Diagnosis not present

## 2019-03-17 DIAGNOSIS — I10 Essential (primary) hypertension: Secondary | ICD-10-CM | POA: Diagnosis not present

## 2019-03-17 DIAGNOSIS — I480 Paroxysmal atrial fibrillation: Secondary | ICD-10-CM | POA: Diagnosis not present

## 2019-03-17 NOTE — Patient Instructions (Signed)
Medication Instructions:  Your physician recommends that you continue on your current medications as directed. Please refer to the Current Medication list given to you today.  Labwork: You will have labs drawn today: CBC and BMP  Testing/Procedures: None ordered.  Follow-Up: Your physician recommends that you schedule a follow-up appointment in:   12 months with Dr. Klein   Any Other Special Instructions Will Be Listed Below (If Applicable).     If you need a refill on your cardiac medications before your next appointment, please call your pharmacy.  

## 2019-03-17 NOTE — Progress Notes (Signed)
Patient Care Team: Rory Percy, MD as PCP - General (Family Medicine) Deboraha Sprang, MD as Consulting Physician (Clinical Cardiac Electrophysiology)   HPI  Arthur Obrien is a 78 y.o. male Seen in followup for atrial fibrillation with a moderately rapid ventricular response in the setting of sinus bradycardia.      Date Cr K Hgb  3/18    12.2  1/19 0.89  8.9  3/19 0.97  11.2  12/19 1.05 4.1 11.8        He underwent ileostomy with an end colostomy 1/19 for rectal cancer.  Course was complicated by recurrent atrial fibrillation with a rapid ventricular response.  Atrial fib burden hard to assess, as he doesn't feel it anymore   Scant palps  The patient denies chest pain, shortness of breath, nocturnal dyspnea, orthopnea or peripheral edema.  There have been  lightheadedness or syncope.    DATE TEST EF   6/14 Echo   65 % LAE 47           Thromboembolic risk factors ( age  -2, HTN-1, ) for a CHADSVASc Score of 3   Past Medical History:  Diagnosis Date  . Abdominal pain, epigastric 12/14/2016  . Colon cancer ([place]) 1992   rectal stage 3  . Colon polyps   . Colon polyps   . Family history of breast cancer   . GERD (gastroesophageal reflux disease)   . HTN (hypertension)   . Hx of echocardiogram    a.  Echo (6/14): EF 55-60%, mild-mod LAE, normal RV function, mild MR, RVSP 37 mmHg, mild pulmonary hypertension    . Hypercholesterolemia 02/08/2012  . PAF (paroxysmal atrial fibrillation) ([place]) 09/26/2012  . Rectal cancer ([place]) 02/08/2012   Stage III cancer of the rectum with resection earlier in 1992, followed by combination chemotherapy with 5-FU, leucovorin and levamisole in conjunction with radiation therapy to the pelvis by Dr. Nila Nephew, and he has had no evidence of recurrence thus far. His last colonoscopy was by Dr. Benson Norway in November 2010 and for his next one, he would like to see Dr. Hildred Laser.  Dr. Posey Rea  . Sinus bradycardia 09/26/2012  . UARS  (upper airway resistance syndrome)    08-21-17 SLEEP STUDY NO CPAP NEEDED    Past Surgical History:  Procedure Laterality Date  . BIOPSY  01/10/2017   Procedure: BIOPSY;  Surgeon: Rogene Houston, MD;  [facility];  Service: Endoscopy;;  polyps @ body and fundus  . CATARACT EXTRACTION Bilateral    right(years ago) left 06/2010  . COLECTOMY N/A 05/11/2017   Procedure: OPEN COMPLETION COLECTOMY WITH ILEOSTOMY;  Surgeon: Ileana Roup, MD;  [facility];  Service: General;  Laterality: N/A;  . COLON SURGERY    . COLONOSCOPY N/A 01/10/2017   Procedure: COLONOSCOPY;  Surgeon: Rogene Houston, MD;  [facility];  Service: Endoscopy;  Laterality: N/A;  . COLONOSCOPY WITH PROPOFOL N/A 02/28/2017   Procedure: COLONOSCOPY WITH PROPOFOL WITH TATTOO;  Surgeon: Ileana Roup, MD;  Location: [facility];  Service: General;  Laterality: N/A;  . CYSTOSCOPY WITH STENT PLACEMENT N/A 05/11/2017   Procedure: CYSTOSCOPY WITH BILATERAL STENT PLACEMENT;  Surgeon: Franchot Gallo, MD;  [facility];  Service: Urology;  Laterality: N/A;  . ESOPHAGOGASTRODUODENOSCOPY N/A 01/10/2017   Procedure: ESOPHAGOGASTRODUODENOSCOPY (EGD);  Surgeon: Rogene Houston, MD;  [facility];  Service: Endoscopy;  Laterality: N/A;  12:45-rescheduled to 9/12 @ 2:00pm per Lelon Frohlich  .  FLEXIBLE SIGMOIDOSCOPY N/A 05/02/2018   Procedure: FLEXIBLE SIGMOIDOSCOPY;  Surgeon: Ileana Roup, MD;  Location: [facility];  Service: General;  Laterality: N/A;  . ILEOSTOMY N/A 05/11/2017   Procedure: ILEOSTOMY;  Surgeon: Ileana Roup, MD;  [facility];  Service: General;  Laterality: N/A;  . POLYPECTOMY  01/10/2017   Procedure: POLYPECTOMY;  Surgeon: Rogene Houston, MD;  [facility];  Service: Endoscopy;;  ascending colon, transverse colon, and splenic flexure  . TONSILLECTOMY  AGE 59    Current Outpatient Medications  Medication Sig Dispense Refill  . acetaminophen  (TYLENOL) 500 MG tablet Take 1,000 mg by mouth every 6 (six) hours as needed for mild pain or headache.    . Cyanocobalamin 2500 MCG TABS Take 5,000 mcg by mouth daily.     Marland Kitchen diltiazem (CARDIZEM) 30 MG tablet TAKE ONE TABLET BY MOUTH EVERY 4 HOURS AS NEEDED FOR HEART PALPITATIONS/ RACING. 90 tablet 1  . diltiazem (TIAZAC) 120 MG 24 hr capsule TAKE ONE CAPSULE BY MOUTH TWICE DAILY. 180 capsule 1  . Hypromellose (ARTIFICIAL TEARS OP) Place 1 drop into both eyes every 4 (four) hours as needed (for dry eyes).     . Multiple Vitamin (MULTIVITAMIN) tablet Take 1 tablet by mouth daily.    [name] 20 MG TABS tablet TAKE ONE TABLET BY MOUTH DAILY WITH SUPPER. 30 tablet 6   No current facility-administered medications for this visit.     No Known Allergies  Review of Systems negative except from HPI and PMH  Physical Exam BP 126/68   Pulse 92   Ht 5\' 10"  (1.778 m)   Wt 191 lb (86.6 kg)   SpO2 97%   BMI 27.41 kg/m  Well developed and nourished in no acute distress HENT normal Neck supple with JVP-flat Carotids brisk and full without bruits Clear Irregularly irregular rate and rhythm with controlled ventricular response, no murmurs or gallops Abd-soft with active BS without hepatomegaly No Clubbing cyanosis edema Skin-warm and dry A & Oriented  Grossly normal sensory and motor function  ECG afib @ 92 -/08/35  Assessment and  Plan  Sinus bradycardia  Atrial fibrillation-persistent   Hypertension  Anemia  Sleep disordered breathing and daytime somnolence  Status post colectomy with a colostomy for rectal cancer 1/19    Atrial fibrillation now prob permanent  On Anticoagulation;  No bleeding issues   Continue current meds  BP well controlled

## 2019-03-18 LAB — BASIC METABOLIC PANEL
BUN/Creatinine Ratio: 11 (ref 10–24)
BUN: 11 mg/dL (ref 8–27)
CO2: 22 mmol/L (ref 20–29)
Calcium: 9.6 mg/dL (ref 8.6–10.2)
Chloride: 104 mmol/L (ref 96–106)
Creatinine, Ser: 1.03 mg/dL (ref 0.76–1.27)
GFR calc Af Amer: 79 mL/min/{1.73_m2} (ref >59)
GFR calc non Af Amer: 68 mL/min/{1.73_m2} (ref >59)
Glucose: 97 mg/dL (ref 65–99)
Potassium: 4.7 mmol/L (ref 3.5–5.2)
Sodium: 140 mmol/L (ref 134–144)

## 2019-03-18 LAB — CBC
Hematocrit: 40.6 % (ref 37.5–51.0)
Hemoglobin: 14 g/dL (ref 13.0–17.7)
MCH: 30.8 pg (ref 26.6–33.0)
MCHC: 34.5 g/dL (ref 31.5–35.7)
MCV: 89 fL (ref 79–97)
Platelets: 209 10*3/uL (ref 150–450)
RBC: 4.54 x10E6/uL (ref 4.14–5.80)
RDW: 12.9 % (ref 11.6–15.4)
WBC: 7.3 10*3/uL (ref 3.4–10.8)

## 2019-03-19 ENCOUNTER — Other Ambulatory Visit: Payer: Self-pay | Admitting: Internal Medicine

## 2019-03-19 NOTE — Telephone Encounter (Signed)
°*  STAT* If patient is at the pharmacy, call can be transferred to refill team.   1. Which medications need to be refilled? (please list name of each medication and dose if known) XARELTO 20 MG TABS tablet diltiazem (TIAZAC) 120 MG 24 hr capsule  2. Which pharmacy/location (including street and city if local pharmacy) is medication to be sent to? [facility] - [place], [place]  3. Do they need a 30 day or 90 day supply? 90 day  Patient is out of medication

## 2019-03-20 NOTE — Telephone Encounter (Signed)
Age 78, weight 86.6kg, SCr 1.03 on 03/17/19, CrCl 67mL/min. Last OV 03/17/19, afib indication

## 2019-06-23 DIAGNOSIS — Z932 Ileostomy status: Secondary | ICD-10-CM | POA: Diagnosis not present

## 2019-06-26 DIAGNOSIS — Z932 Ileostomy status: Secondary | ICD-10-CM | POA: Diagnosis not present

## 2019-06-27 ENCOUNTER — Other Ambulatory Visit: Payer: Self-pay | Admitting: Internal Medicine

## 2019-07-02 DIAGNOSIS — Z932 Ileostomy status: Secondary | ICD-10-CM | POA: Diagnosis not present

## 2019-07-16 DIAGNOSIS — Z0001 Encounter for general adult medical examination with abnormal findings: Secondary | ICD-10-CM | POA: Diagnosis not present

## 2019-07-16 DIAGNOSIS — Z0000 Encounter for general adult medical examination without abnormal findings: Secondary | ICD-10-CM | POA: Diagnosis not present

## 2019-07-16 DIAGNOSIS — Z23 Encounter for immunization: Secondary | ICD-10-CM | POA: Diagnosis not present

## 2019-07-16 DIAGNOSIS — I10 Essential (primary) hypertension: Secondary | ICD-10-CM | POA: Diagnosis not present

## 2019-07-16 DIAGNOSIS — M25551 Pain in right hip: Secondary | ICD-10-CM | POA: Diagnosis not present

## 2019-07-16 DIAGNOSIS — I480 Paroxysmal atrial fibrillation: Secondary | ICD-10-CM | POA: Diagnosis not present

## 2019-08-22 DIAGNOSIS — Z932 Ileostomy status: Secondary | ICD-10-CM | POA: Diagnosis not present

## 2019-09-20 ENCOUNTER — Other Ambulatory Visit: Payer: Self-pay | Admitting: Internal Medicine

## 2019-09-22 NOTE — Telephone Encounter (Signed)
Prescription refill request for Xarelto received.   Last office visit: Arthur Obrien 03/17/2019 Weight: 86.6 kg Age: 79 y.o. Scr: 1.03, 03/17/2019 CrCl: 68.9 ml/min   Prescription refill sent.

## 2019-10-03 DIAGNOSIS — Z932 Ileostomy status: Secondary | ICD-10-CM | POA: Diagnosis not present

## 2019-10-23 DIAGNOSIS — Z932 Ileostomy status: Secondary | ICD-10-CM | POA: Diagnosis not present

## 2019-12-14 DIAGNOSIS — I10 Essential (primary) hypertension: Secondary | ICD-10-CM | POA: Diagnosis not present

## 2019-12-14 DIAGNOSIS — K8020 Calculus of gallbladder without cholecystitis without obstruction: Secondary | ICD-10-CM | POA: Diagnosis not present

## 2019-12-14 DIAGNOSIS — R112 Nausea with vomiting, unspecified: Secondary | ICD-10-CM | POA: Diagnosis not present

## 2019-12-14 DIAGNOSIS — Z932 Ileostomy status: Secondary | ICD-10-CM | POA: Diagnosis not present

## 2019-12-14 DIAGNOSIS — K429 Umbilical hernia without obstruction or gangrene: Secondary | ICD-10-CM | POA: Diagnosis not present

## 2019-12-14 DIAGNOSIS — Z9049 Acquired absence of other specified parts of digestive tract: Secondary | ICD-10-CM | POA: Diagnosis not present

## 2019-12-14 DIAGNOSIS — R1013 Epigastric pain: Secondary | ICD-10-CM | POA: Diagnosis not present

## 2019-12-14 DIAGNOSIS — I4891 Unspecified atrial fibrillation: Secondary | ICD-10-CM | POA: Diagnosis not present

## 2019-12-14 DIAGNOSIS — I700 Atherosclerosis of aorta: Secondary | ICD-10-CM | POA: Diagnosis not present

## 2019-12-14 DIAGNOSIS — K439 Ventral hernia without obstruction or gangrene: Secondary | ICD-10-CM | POA: Diagnosis not present

## 2019-12-14 DIAGNOSIS — R109 Unspecified abdominal pain: Secondary | ICD-10-CM | POA: Diagnosis not present

## 2019-12-14 DIAGNOSIS — I2510 Atherosclerotic heart disease of native coronary artery without angina pectoris: Secondary | ICD-10-CM | POA: Diagnosis not present

## 2019-12-26 ENCOUNTER — Other Ambulatory Visit: Payer: Self-pay | Admitting: Internal Medicine

## 2019-12-30 DIAGNOSIS — Z932 Ileostomy status: Secondary | ICD-10-CM | POA: Diagnosis not present

## 2020-01-02 DIAGNOSIS — Z932 Ileostomy status: Secondary | ICD-10-CM | POA: Diagnosis not present

## 2020-01-23 DIAGNOSIS — Z932 Ileostomy status: Secondary | ICD-10-CM | POA: Diagnosis not present

## 2020-03-22 DIAGNOSIS — Z932 Ileostomy status: Secondary | ICD-10-CM | POA: Diagnosis not present

## 2020-03-30 DIAGNOSIS — Z932 Ileostomy status: Secondary | ICD-10-CM | POA: Diagnosis not present

## 2020-04-03 DIAGNOSIS — Z932 Ileostomy status: Secondary | ICD-10-CM | POA: Diagnosis not present

## 2020-04-06 DIAGNOSIS — Z932 Ileostomy status: Secondary | ICD-10-CM | POA: Diagnosis not present

## 2020-04-28 ENCOUNTER — Other Ambulatory Visit: Payer: Self-pay | Admitting: Internal Medicine

## 2020-04-28 DIAGNOSIS — I480 Paroxysmal atrial fibrillation: Secondary | ICD-10-CM

## 2020-04-28 NOTE — Telephone Encounter (Signed)
Pt last saw Dr Graciela Husbands 03/17/19, pt is overdue for follow-up. Last labs 12/14/19 Creat 1.3 at [facility] per care everywhere, age 79, weight 86.6kg, CrCl 54.59, based on CrCl pt is on appropriate dosage of Xarelto 20mg  QD.  Will refill rx once follow-up appt scheduled with Dr .  Msg sent to schedulers to make 1 yr follow-up appt for rx refill.

## 2020-04-28 NOTE — Telephone Encounter (Signed)
Patient has been scheduled to see Arthur Obrien 05/12/20 at 9:15 am, but will be out of medication before the appointment. He is requesting the refill be sent to the pharmacy today. Please advise.

## 2020-05-10 NOTE — Progress Notes (Signed)
Cardiology Office Note Date:  05/12/2020  Patient ID:  Arthur Obrien, Arthur Obrien Aug 13, 1940, MRN [id number] PCP:  Rory Percy, MD  Electrophysiologist:  Dr. Caryl Comes    Chief Complaint: annual visit  History of Present Illness: Arthur Obrien is a 80 y.o. male with history of PAFib, SB, HTN, HLD (the patient denies high cholesterol, states his PMD follows his labs/lipids, has never been told was high), colon and rectal ca (ileostomy).  He comes today to be seen for Dr. Caryl Comes, last seen by him Nov 2020, at that time, mentions AF burden hard to assess with minimal symptoms, scant palpitations despite being in AF, and thought likely had become permanent. No changes were made.  TODAY He feels well. Has not ad recurrent GI symptoms since his ER visit in August. No CP, palpitations or cardiac awareness. He is unaware of his AFib, infrequently his watch will alert him to a HR >100, though infrequently No exertional intolerances, no SOB, DOE. No dizzy spells, near syncope or syncope. No bleeding or signs of bleeding. Has not had lipids checked in a long time, well >year     Past Medical History:  Diagnosis Date   Abdominal pain, epigastric 12/14/2016   Colon cancer ([place]) 1992   rectal stage 3   Colon polyps    Colon polyps    Family history of breast cancer    GERD (gastroesophageal reflux disease)    HTN (hypertension)    Hx of echocardiogram    a.  Echo (6/14): EF 55-60%, mild-mod LAE, normal RV function, mild MR, RVSP 37 mmHg, mild pulmonary hypertension     Hypercholesterolemia 02/08/2012   PAF (paroxysmal atrial fibrillation) ([place]) 09/26/2012   Rectal cancer ([place]) 02/08/2012   Stage III cancer of the rectum with resection earlier in 1992, followed by combination chemotherapy with 5-FU, leucovorin and levamisole in conjunction with radiation therapy to the pelvis by Dr. Nila Nephew, and he has had no evidence of recurrence thus far. His last colonoscopy was by Dr.  Benson Norway in November 2010 and for his next one, he would like to see Dr. Hildred Laser.  Dr. Posey Rea   Sinus bradycardia 09/26/2012   UARS (upper airway resistance syndrome)    08-21-17 SLEEP STUDY NO CPAP NEEDED    Past Surgical History:  Procedure Laterality Date   BIOPSY  01/10/2017   Procedure: BIOPSY;  Surgeon: Rogene Houston, MD;  [facility];  Service: Endoscopy;;  polyps @ body and fundus   CATARACT EXTRACTION Bilateral    right(years ago) left 06/2010   COLECTOMY N/A 05/11/2017   Procedure: OPEN COMPLETION COLECTOMY WITH ILEOSTOMY;  Surgeon: Ileana Roup, MD;  [facility];  Service: General;  Laterality: N/A;   COLON SURGERY     COLONOSCOPY N/A 01/10/2017   Procedure: COLONOSCOPY;  Surgeon: Rogene Houston, MD;  [facility];  Service: Endoscopy;  Laterality: N/A;   COLONOSCOPY WITH PROPOFOL N/A 02/28/2017   Procedure: COLONOSCOPY WITH PROPOFOL WITH TATTOO;  Surgeon: Ileana Roup, MD;  Location: [facility];  Service: General;  Laterality: N/A;   CYSTOSCOPY WITH STENT PLACEMENT N/A 05/11/2017   Procedure: CYSTOSCOPY WITH BILATERAL STENT PLACEMENT;  Surgeon: Franchot Gallo, MD;  [facility];  Service: Urology;  Laterality: N/A;   ESOPHAGOGASTRODUODENOSCOPY N/A 01/10/2017   Procedure: ESOPHAGOGASTRODUODENOSCOPY (EGD);  Surgeon: Rogene Houston, MD;  [facility];  Service: Endoscopy;  Laterality: N/A;  12:45-rescheduled to 9/12 @ 2:00pm per Lelon Frohlich  FLEXIBLE SIGMOIDOSCOPY N/A 05/02/2018   Procedure: FLEXIBLE SIGMOIDOSCOPY;  Surgeon: Ileana Roup, MD;  Location: [facility];  Service: General;  Laterality: N/A;   ILEOSTOMY N/A 05/11/2017   Procedure: ILEOSTOMY;  Surgeon: Ileana Roup, MD;  [facility];  Service: General;  Laterality: N/A;   POLYPECTOMY  01/10/2017   Procedure: POLYPECTOMY;  Surgeon: Rogene Houston, MD;  [facility];  Service: Endoscopy;;  ascending colon,  transverse colon, and splenic flexure   TONSILLECTOMY  AGE 35    Current Outpatient Medications  Medication Sig Dispense Refill   acetaminophen (TYLENOL) 500 MG tablet Take 1,000 mg by mouth every 6 (six) hours as needed for mild pain or headache.     B Complex Vitamins (VITAMIN B COMPLEX) TABS Take 1 tablet by mouth daily.     Hypromellose (ARTIFICIAL TEARS OP) Place 1 drop into both eyes every 4 (four) hours as needed (for dry eyes).      Multiple Vitamin (MULTIVITAMIN) tablet Take 1 tablet by mouth daily.     diltiazem (CARDIZEM) 30 MG tablet TAKE ONE TABLET BY MOUTH EVERY 4 HOURS AS NEEDED FOR HEART PALPITATIONS/ RACING. 90 tablet 0   diltiazem (TIAZAC) 120 MG 24 hr capsule Take 1 capsule (120 mg total) by mouth 2 (two) times daily. 180 capsule 2   rivaroxaban (XARELTO) 20 MG TABS tablet TAKE ONE TABLET BY MOUTH DAILY WITH SUPPER. 90 tablet 2   No current facility-administered medications for this visit.    Allergies:   Patient has no known allergies.   Social History:  The patient  reports that he has never smoked. He has never used smokeless tobacco. He reports that he does not drink alcohol and does not use drugs.   Family History:  The patient's family history includes Breast cancer (age of onset: 4) in his daughter; Cancer in his father and paternal aunt; Congestive Heart Failure in his mother; Hypertension in his mother.  ROS:  Please see the history of present illness. All other systems are reviewed and otherwise negative.   PHYSICAL EXAM:  VS:  BP 124/60    Pulse (!) 127    Ht 5\' 10"  (1.778 m)    Wt 186 lb 12.8 oz (84.7 kg)    SpO2 98%    BMI 26.80 kg/m  BMI: Body mass index is 26.8 kg/m. Well nourished, well developed, in no acute distress  HEENT: normocephalic, atraumatic  Neck: no JVD, carotid bruits or masses Cardiac:  irreg-irreg; no significant murmurs are appreciated no rubs, or gallops Lungs:  CTA b/l, no wheezing, rhonchi or rales  Abd: soft,  non-tender MS: no deformity, age appropriate atrophy Ext:   no edema  Skin: warm and dry, no rash Neuro:  No gross deficits appreciated Psych: euthymic mood, full affect   EKG:  Done today and reviewed by myself AFib 127bpm, no changes other then faster rate  06/26/12: TTE LVEF 55-60%, LA mild-mod dilated Mild AV calcification, mild AI Mild MR RV 27mmHg Mild p.HTN   Recent Labs: No results found for requested labs within last 8760 hours.  No results found for requested labs within last 8760 hours.   CrCl cannot be calculated (Patient's most recent lab result is older than the maximum 21 days allowed.).   Wt Readings from Last 3 Encounters:  05/12/20 186 lb 12.8 oz (84.7 kg)  03/17/19 191 lb (86.6 kg)  05/02/18 187 lb (84.8 kg)     Other studies reviewed: Additional studies/records reviewed today  include: summarized above  ASSESSMENT AND PLAN:  1. Paroxysmal Afib     CHA2DS2Vasc is at least 3, on Xarelto, appropriately dosed     No symptoms of bradycardia  After sitting a few minutes with me, auscultation of his HR is in the 80's Will have him wear a 3 day monitor to better assess HR control.       2. HTN     Looks OK, no changes    Disposition: will get labs today, including lipids, see him back in 58mo, pending his monitor findings, sooner if needed.    Current medicines are reviewed at length with the patient today.  The patient did not have any concerns regarding medicines.  Venetia Night, PA-C 05/12/2020 9:44 AM     [address] [place] [address] [phone] (office)  [phone] (fax)

## 2020-05-12 ENCOUNTER — Encounter: Payer: Self-pay | Admitting: Radiology

## 2020-05-12 ENCOUNTER — Ambulatory Visit: Payer: Medicare Other | Admitting: Physician Assistant

## 2020-05-12 ENCOUNTER — Ambulatory Visit (INDEPENDENT_AMBULATORY_CARE_PROVIDER_SITE_OTHER): Payer: Medicare Other

## 2020-05-12 ENCOUNTER — Other Ambulatory Visit: Payer: Self-pay

## 2020-05-12 ENCOUNTER — Encounter: Payer: Self-pay | Admitting: Physician Assistant

## 2020-05-12 VITALS — BP 124/60 | HR 127 | Ht 70.0 in | Wt 186.8 lb

## 2020-05-12 DIAGNOSIS — I10 Essential (primary) hypertension: Secondary | ICD-10-CM | POA: Diagnosis not present

## 2020-05-12 DIAGNOSIS — I4821 Permanent atrial fibrillation: Secondary | ICD-10-CM | POA: Diagnosis not present

## 2020-05-12 LAB — COMPREHENSIVE METABOLIC PANEL
ALT: 12 IU/L (ref 0–44)
AST: 13 IU/L (ref 0–40)
Albumin/Globulin Ratio: 2 (ref 1.2–2.2)
Albumin: 4.2 g/dL (ref 3.6–4.6)
Alkaline Phosphatase: 91 IU/L (ref 44–121)
BUN/Creatinine Ratio: 11 (ref 10–24)
BUN: 12 mg/dL (ref 8–27)
Bilirubin Total: 0.3 mg/dL (ref 0.0–1.2)
CO2: 23 mmol/L (ref 20–29)
Calcium: 9.3 mg/dL (ref 8.6–10.2)
Chloride: 105 mmol/L (ref 96–106)
Creatinine, Ser: 1.13 mg/dL (ref 0.76–1.27)
GFR calc Af Amer: 70 mL/min/{1.73_m2} (ref >59)
GFR calc non Af Amer: 61 mL/min/{1.73_m2} (ref >59)
Globulin, Total: 2.1 g/dL (ref 1.5–4.5)
Glucose: 98 mg/dL (ref 65–99)
Potassium: 4.4 mmol/L (ref 3.5–5.2)
Sodium: 140 mmol/L (ref 134–144)
Total Protein: 6.3 g/dL (ref 6.0–8.5)

## 2020-05-12 LAB — LIPID PANEL
Chol/HDL Ratio: 3.5 ratio (ref 0.0–5.0)
Cholesterol, Total: 148 mg/dL (ref 100–199)
HDL: 42 mg/dL (ref >39)
LDL Chol Calc (NIH): 71 mg/dL (ref 0–99)
Triglycerides: 209 mg/dL — ABNORMAL HIGH (ref 0–149)
VLDL Cholesterol Cal: 35 mg/dL (ref 5–40)

## 2020-05-12 LAB — CBC
Hematocrit: 42.1 % (ref 37.5–51.0)
Hemoglobin: 14 g/dL (ref 13.0–17.7)
MCH: 30.6 pg (ref 26.6–33.0)
MCHC: 33.3 g/dL (ref 31.5–35.7)
MCV: 92 fL (ref 79–97)
Platelets: 232 10*3/uL (ref 150–450)
RBC: 4.58 x10E6/uL (ref 4.14–5.80)
RDW: 12.7 % (ref 11.6–15.4)
WBC: 8 10*3/uL (ref 3.4–10.8)

## 2020-05-12 MED ORDER — DILTIAZEM HCL ER BEADS 120 MG PO CP24: 120.0000 mg | ORAL_CAPSULE | Freq: Two times a day (BID) | ORAL | 2 refills | Status: DC

## 2020-05-12 MED ORDER — RIVAROXABAN 20 MG PO TABS: ORAL_TABLET | ORAL | 2 refills | Status: DC

## 2020-05-12 MED ORDER — DILTIAZEM HCL 30 MG PO TABS: ORAL_TABLET | ORAL | 0 refills | Status: DC

## 2020-05-12 NOTE — Patient Instructions (Addendum)
Medication Instructions:   Your physician recommends that you continue on your current medications as directed. Please refer to the Current Medication list given to you today.  *If you need a refill on your cardiac medications before your next appointment, please call your pharmacy*   Lab Work:  CMET LIPIDS AND CBC TODAY    If you have labs (blood work) drawn today and your tests are completely normal, you will receive your results only by: Marland Kitchen MyChart Message (if you have MyChart) OR . A paper copy in the mail If you have any lab test that is abnormal or we need to change your treatment, we will call you to review the results.   Testing/Procedures:Your physician has recommended that you wear an event monitor. Event monitors are medical devices that record the heart's electrical activity. Doctors most often [place] these monitors to diagnose arrhythmias. Arrhythmias are problems with the speed or rhythm of the heartbeat. The monitor is a small, portable device. You can wear one while you do your normal daily activities. This is usually used to diagnose what is causing palpitations/syncope (passing out).   Follow-Up: At [facility], you and your health needs are our priority.  As part of our continuing mission to provide you with exceptional heart care, we have created designated Provider Care Teams.  These Care Teams include your primary Cardiologist (physician) and Advanced Practice Providers (APPs -  Physician Assistants and Nurse Practitioners) who all work together to provide you with the care you need, when you need it.  We recommend signing up for the patient portal called "MyChart".  Sign up information is provided on this After Visit Summary.  MyChart is used to connect with patients for Virtual Visits (Telemedicine).  Patients are able to view lab/test results, encounter notes, upcoming appointments, etc.  Non-urgent messages can be sent to your provider as well.   To learn more about  what you can do with MyChart, go to [email or web address].    Your next appointment:   6 month(s)  The format for your next appointment:   In Person  Provider:   Tommye Standard, PA-C   Other Instructions  [place] Monitor Instructions    Your physician has requested you wear your ZIO patch monitor___3____days.   This is a single patch monitor.  [employer] supplies one patch monitor per enrollment.  Additional stickers are not available.   Please do not apply patch if you will be having a Nuclear Stress Test, Echocardiogram, Cardiac CT, MRI, or Chest Xray during the time frame you would be wearing the monitor. The patch cannot be worn during these tests.  You cannot remove and re-apply the ZIO XT patch monitor.   Your ZIO patch monitor will be sent USPS Priority mail from [facility] directly to your home address. The monitor may also be mailed to a PO BOX if home delivery is not available.   It may take 3-5 days to receive your monitor after you have been enrolled.   Once you have received you monitor, please review enclosed instructions.  Your monitor has already been registered assigning a specific monitor serial # to you.   Applying the monitor   Shave hair from upper left chest.   Hold abrader disc by orange tab.  Rub abrader in 40 strokes over left upper chest as indicated in your monitor instructions.   Clean area with 4 enclosed alcohol pads .  Use all pads to assure are is cleaned thoroughly.  Let dry.   Apply patch as indicated in monitor instructions.  Patch will be place under collarbone on left side of chest with arrow pointing upward.   Rub patch adhesive wings for 2 minutes.Remove white label marked "1".  Remove white label marked "2".  Rub patch adhesive wings for 2 additional minutes.   While looking in a mirror, press and release button in center of patch.  A small green light will flash 3-4 times .  This will be your only indicator the monitor  has been turned on.     Do not shower for the first 24 hours.  You may shower after the first 24 hours.   Press button if you feel a symptom. You will hear a small click.  Record Date, Time and Symptom in the Patient Log Book.   When you are ready to remove patch, follow instructions on last 2 pages of Patient Log Book.  Stick patch monitor onto last page of Patient Log Book.   Place Patient Log Book in [place] box.  Use locking tab on box and tape box closed securely.  The [employer] has [employer] on it.  Please place in mailbox as soon as possible.  Your physician should have your test results approximately 7 days after the monitor has been mailed back to [facility].   Call [place] at [phone] if you have questions regarding your ZIO XT patch monitor.  Call them immediately if you see an orange light blinking on your monitor.   If your monitor falls off in less than 4 days contact our Monitor department at [phone].  If your monitor becomes loose or falls off after 4 days call [employer] at [phone] for suggestions on securing your monitor.

## 2020-05-12 NOTE — Progress Notes (Signed)
Enrolled patient for a 3 day Zio XT monitor to be mailed to patients home  

## 2020-05-15 DIAGNOSIS — I4821 Permanent atrial fibrillation: Secondary | ICD-10-CM | POA: Diagnosis not present

## 2020-05-24 ENCOUNTER — Telehealth: Payer: Self-pay | Admitting: Internal Medicine

## 2020-05-24 DIAGNOSIS — I4821 Permanent atrial fibrillation: Secondary | ICD-10-CM | POA: Diagnosis not present

## 2020-05-24 NOTE — Telephone Encounter (Signed)
[employer] calling "end of service" report. Aware already in our system and will ensure MD is aware to review result.

## 2020-05-24 NOTE — Telephone Encounter (Signed)
Nicolette from [facility] calling with abnormal zio patch results.

## 2020-05-26 ENCOUNTER — Telehealth: Payer: Self-pay | Admitting: *Deleted

## 2020-05-26 ENCOUNTER — Other Ambulatory Visit: Payer: Self-pay | Admitting: *Deleted

## 2020-05-26 MED ORDER — METOPROLOL SUCCINATE ER 50 MG PO TB24: 50.0000 mg | ORAL_TABLET | Freq: Every day | ORAL | 3 refills | Status: DC

## 2020-05-26 NOTE — Telephone Encounter (Signed)
Spoke with patient aware of monitor results and new medication start Metoprolol Succinate 50 mg once a day. 90 day sent to [facility]

## 2020-05-26 NOTE — Telephone Encounter (Signed)
-----   Message from Deputy, [place] sent at 05/26/2020  4:36 PM EST ----- Please call the patient, monitor notes his HR tends to stay fast, opvg HR 112, would like that slowed some, ideally 90's or slower. Please add Metoprolol succ 50mg  daily.

## 2020-06-23 ENCOUNTER — Ambulatory Visit: Payer: Medicare Other | Admitting: Internal Medicine

## 2020-06-23 ENCOUNTER — Other Ambulatory Visit: Payer: Self-pay

## 2020-06-23 VITALS — BP 130/70 | HR 83 | Ht 70.0 in | Wt 191.6 lb

## 2020-06-23 DIAGNOSIS — I4891 Unspecified atrial fibrillation: Secondary | ICD-10-CM | POA: Diagnosis not present

## 2020-06-23 NOTE — Progress Notes (Signed)
Patient Care Team: Rory Percy, MD as PCP - General (Family Medicine) Deboraha Sprang, MD as Consulting Physician (Clinical Cardiac Electrophysiology)   HPI  Arthur Obrien is a 80 y.o. male Seen in followup for atrial fibrillation with a moderately rapid ventricular response in the setting of sinus bradycardia. He underwent ileostomy with an end colostomy 1/19 for rectal cancer.  Course was complicated by recurrent atrial fibrillation with a rapid ventricular response.  Saw RU-PA 1/22.  Smart watch with say that his heart rate was faster than 100.  Undertook an event recorder which showed an average heart rate of about 111 with some rates over 200.  She started him on [place]ck metoprolol succinate which has "slowed him down "other than this, he denies problems with exercise tolerance chest pain nocturnal dyspnea orthopnea or peripheral edema.  He has no palpitations.  He has no bleeding on his anticoagulation     Date Cr K Hgb  3/18    12.2  1/19 0.89  8.9  3/19 0.97  11.2  12/19 1.05 4.1 11.8  1/22 1.13 4.4 14.0   The patient denies chest pain, shortness of breath, nocturnal dyspnea, orthopnea or peripheral edema.  There have been no palpitations, lightheadedness or syncope.    DATE TEST EF   6/14 Echo   65 % LAE 47           Thromboembolic risk factors ( age  -2, HTN-1, ) for a CHADSVASc Score of 3   Past Medical History:  Diagnosis Date  . Abdominal pain, epigastric 12/14/2016  . Colon cancer ([place]) 1992   rectal stage 3  . Colon polyps   . Colon polyps   . Family history of breast cancer   . GERD (gastroesophageal reflux disease)   . HTN (hypertension)   . Hx of echocardiogram    a.  Echo (6/14): EF 55-60%, mild-mod LAE, normal RV function, mild MR, RVSP 37 mmHg, mild pulmonary hypertension    . Hypercholesterolemia 02/08/2012  . PAF (paroxysmal atrial fibrillation) ([place]) 09/26/2012  . Rectal cancer ([place]) 02/08/2012   Stage III cancer of the rectum with resection  earlier in 1992, followed by combination chemotherapy with 5-FU, leucovorin and levamisole in conjunction with radiation therapy to the pelvis by Dr. Nila Nephew, and he has had no evidence of recurrence thus far. His last colonoscopy was by Dr. Benson Norway in November 2010 and for his next one, he would like to see Dr. Hildred Laser.  Dr. Posey Rea  . Sinus bradycardia 09/26/2012  . UARS (upper airway resistance syndrome)    08-21-17 SLEEP STUDY NO CPAP NEEDED    Past Surgical History:  Procedure Laterality Date  . BIOPSY  01/10/2017   Procedure: BIOPSY;  Surgeon: Rogene Houston, MD;  [facility];  Service: Endoscopy;;  polyps @ body and fundus  . CATARACT EXTRACTION Bilateral    right(years ago) left 06/2010  . COLECTOMY N/A 05/11/2017   Procedure: OPEN COMPLETION COLECTOMY WITH ILEOSTOMY;  Surgeon: Ileana Roup, MD;  [facility];  Service: General;  Laterality: N/A;  . COLON SURGERY    . COLONOSCOPY N/A 01/10/2017   Procedure: COLONOSCOPY;  Surgeon: Rogene Houston, MD;  [facility];  Service: Endoscopy;  Laterality: N/A;  . COLONOSCOPY WITH PROPOFOL N/A 02/28/2017   Procedure: COLONOSCOPY WITH PROPOFOL WITH TATTOO;  Surgeon: Ileana Roup, MD;  [facility];  Service: General;  Laterality: N/A;  . CYSTOSCOPY WITH STENT PLACEMENT N/A  05/11/2017   Procedure: CYSTOSCOPY WITH BILATERAL STENT PLACEMENT;  Surgeon: Franchot Gallo, MD;  [facility];  Service: Urology;  Laterality: N/A;  . ESOPHAGOGASTRODUODENOSCOPY N/A 01/10/2017   Procedure: ESOPHAGOGASTRODUODENOSCOPY (EGD);  Surgeon: Rogene Houston, MD;  [facility];  Service: Endoscopy;  Laterality: N/A;  12:45-rescheduled to 9/12 @ 2:00pm per Lelon Frohlich  . FLEXIBLE SIGMOIDOSCOPY N/A 05/02/2018   Procedure: FLEXIBLE SIGMOIDOSCOPY;  Surgeon: Ileana Roup, MD;  Location: [facility];  Service: General;  Laterality: N/A;  . ILEOSTOMY N/A 05/11/2017   Procedure: ILEOSTOMY;   Surgeon: Ileana Roup, MD;  [facility];  Service: General;  Laterality: N/A;  . POLYPECTOMY  01/10/2017   Procedure: POLYPECTOMY;  Surgeon: Rogene Houston, MD;  [facility];  Service: Endoscopy;;  ascending colon, transverse colon, and splenic flexure  . TONSILLECTOMY  AGE 57    Current Outpatient Medications  Medication Sig Dispense Refill  . acetaminophen (TYLENOL) 500 MG tablet Take 1,000 mg by mouth every 6 (six) hours as needed for mild pain or headache.    . B Complex Vitamins (VITAMIN B COMPLEX) TABS Take 1 tablet by mouth daily.    Marland Kitchen diltiazem (CARDIZEM) 30 MG tablet TAKE ONE TABLET BY MOUTH EVERY 4 HOURS AS NEEDED FOR HEART PALPITATIONS/ RACING. 90 tablet 0  . diltiazem (TIAZAC) 120 MG 24 hr capsule Take 1 capsule (120 mg total) by mouth 2 (two) times daily. 180 capsule 2  . famotidine (PEPCID) 20 MG tablet Take 20 mg by mouth as needed for heartburn or indigestion.    . Hypromellose (ARTIFICIAL TEARS OP) Place 1 drop into both eyes every 4 (four) hours as needed (for dry eyes).     . metoprolol succinate (TOPROL-XL) 50 MG 24 hr tablet Take 1 tablet (50 mg total) by mouth daily. Take with or immediately following a meal. 90 tablet 3  . Multiple Vitamin (MULTIVITAMIN) tablet Take 1 tablet by mouth daily.    . rivaroxaban (XARELTO) 20 MG TABS tablet TAKE ONE TABLET BY MOUTH DAILY WITH SUPPER. 90 tablet 2   No current facility-administered medications for this visit.    No Known Allergies  Review of Systems negative except from HPI and PMH  Physical Exam BP 130/70   Pulse 83   Ht 5\' 10"  (1.778 m)   Wt 191 lb 9.6 oz (86.9 kg)   SpO2 96%   BMI 27.49 kg/m  Well developed and nourished in no acute distress HENT normal Neck supple with JVP-  Flat  Clear Regular rate and rhythm, no murmurs or gallops Abd-soft with active BS No Clubbing cyanosis edema Skin-warm and dry A & Oriented  Grossly normal sensory and motor function  ECG atrial  fibrillation at 83 Intervals-/08/34  Assessment and  Plan  Sinus bradycardia  Atrial fibrillation-permanent//RVR   Hypertension  Anemia  Sleep disordered breathing and daytime somnolence  Status post colectomy with a colostomy for rectal cancer 1/19    Atrial fib,. Now permanent with RVR there is some fatigue since the beginning of his metoprolol succinate; not sure if it is related to that his heart rate is more normal or the drug itself; suspect it is the latter.  Hence, have given her prescriptions for atenolol 50, bisoprolol 5 metoprolol tartrate 50 twice daily to try as alternatives.  Once we find a beta-blocker that he is tolerating better we will repeat his Zio patch to assess effectiveness  On Anticoagulation;  No bleeding issues

## 2020-06-23 NOTE — Patient Instructions (Signed)
Medication Instructions:  Your physician has recommended you make the following change in your medication:   You are being given three prescriptions for different beta blockers to try.  You may take these in any order. DO NOT TAKE MORE THAN ONE BETA BLOCKER AT A TIME. Choose one beta blocker to start with. If after two weeks, you feel the medication is working well for you, continue that current medication. If it does not work well for you, try another prescription for a beta blocker at that time. You may continue that current beta blocker at that time if it works well for you. If it does not, repeat these instructions for the third beta blocker.   When you find a beta blocker that works well for you, call the office and we will send in a prescription for you.                1. Atenolol 50mg  tablet             2. Metoprolol Tartrate 50mg  tablet - twice daily             3. Bisoprolol 5mg  tablet    You may take these in any order you please.    Please call us with any questions.  *If you need a refill on your cardiac medications before your next appointment, please call your pharmacy*   Lab Work: None ordered.  If you have labs (blood work) drawn today and your tests are completely normal, you will receive your results only by: Marland Kitchen MyChart Message (if you have MyChart) OR . A paper copy in the mail If you have any lab test that is abnormal or we need to change your treatment, we will call you to review the results.   Testing/Procedures: None ordered.    Follow-Up: At [facility], you and your health needs are our priority.  As part of our continuing mission to provide you with exceptional heart care, we have created designated Provider Care Teams.  These Care Teams include your primary Cardiologist (physician) and Advanced Practice Providers (APPs -  Physician Assistants and Nurse Practitioners) who all work together to provide you with the care you need, when you need it.  We  recommend signing up for the patient portal called "MyChart".  Sign up information is provided on this After Visit Summary.  MyChart is used to connect with patients for Virtual Visits (Telemedicine).  Patients are able to view lab/test results, encounter notes, upcoming appointments, etc.  Non-urgent messages can be sent to your provider as well.   To learn more about what you can do with MyChart, go to [email or web address].    Your next appointment:   12 months with Dr Caryl Comes   Other Instructions Please let us know which medication (beta blocker) works best for you so we may repeat 3 day Zio monitor.  You may call [phone] or send Dr Caryl Comes a MyChart message.

## 2020-07-19 ENCOUNTER — Telehealth: Payer: Self-pay | Admitting: Internal Medicine

## 2020-07-19 NOTE — Telephone Encounter (Signed)
Patient is calling to talk with dr. Caryl Comes or nurse. Please call back

## 2020-07-20 ENCOUNTER — Other Ambulatory Visit: Payer: Self-pay | Admitting: Physician Assistant

## 2020-07-20 NOTE — Telephone Encounter (Signed)
Follow up:     Patient retuning nurse call concering medication.

## 2020-07-20 NOTE — Telephone Encounter (Signed)
Rx #90 with 3 RF

## 2020-07-20 NOTE — Telephone Encounter (Signed)
Spoke with pt who is requesting refill of his Atenolol 50mg .  Pt advised refill request has been received from pharmacy and will send to pharmacy as requested.  Pt states he feels Atenolol is working well.

## 2020-08-30 ENCOUNTER — Telehealth: Payer: Self-pay | Admitting: *Deleted

## 2020-08-30 NOTE — Telephone Encounter (Signed)
Clinical pharmacist to review Xarelto 

## 2020-08-30 NOTE — Telephone Encounter (Signed)
   [facility] Pre-operative Risk Assessment    Patient Name: Arthur Obrien  DOB: November 12, 1940  MRN: [id number]   HEARTCARE STAFF: - Please ensure there is not already an duplicate clearance open for this procedure. - Under Visit Info/Reason for Call, type in Other and utilize the format Clearance MM/DD/YY or Clearance TBD. Do not use dashes or single digits. - If request is for dental extraction, please clarify the # of teeth to be extracted.  Request for surgical clearance:  1. What type of surgery is being performed?  FLEXIBLE SIGMOIDOSCOPY    2. When is this surgery scheduled?  TBD   3. What type of clearance is required (medical clearance vs. Pharmacy clearance to hold med vs. Both)?  BOTH  4. Are there any medications that need to be held prior to surgery and how long? [place] AND PER NOTE, MOST LIKELY NEED LOVENOX BRIDGE?   5. Practice name and name of physician performing surgery?  [facility] / DR. Harrell Gave Obrien   6. What is the office phone number?  [phone]   7.   What is the office fax number?  [phone]  8.   Anesthesia type (None, local, MAC, general) ?  GENERAL   Arthur Obrien 08/30/2020, 4:23 PM  _________________________________________________________________   (provider comments below)

## 2020-08-31 NOTE — Telephone Encounter (Signed)
   Name: Arthur Obrien  DOB: [birth year]  MRN: [id number]   Primary Cardiologist: None  Chart reviewed as part of pre-operative protocol coverage. Patient was contacted 08/31/2020 in reference to pre-operative risk assessment for pending surgery as outlined below.  Arthur Obrien was last seen on 06/23/20 by Dr. Caryl Comes.  Since that day, Arthur Obrien has done well. He is able to complete more 4.0 METS without chest pain.   Per our clinical pharmacist: Per office protocol, patient can hold Xarelto for 1 days prior to procedure.    Patient will not need bridging with Lovenox (enoxaparin) around procedure.  Pt does state this procedure is not scheduled until Jan 2023. He will need an updated clearance prior to Jan 2023.  Therefore, based on ACC/AHA guidelines, the patient would be at acceptable risk for the planned procedure without further cardiovascular testing.   The patient was advised that if he develops new symptoms prior to surgery to contact our office to arrange for a follow-up visit, and he verbalized understanding.  I will route this recommendation to the requesting party via Epic fax function and remove from pre-op pool. Please call with questions.  [place], PA 08/31/2020, 10:27 AM

## 2020-08-31 NOTE — Telephone Encounter (Signed)
Patient with diagnosis of atrial fibrillation on Xarelto for anticoagulation.    Procedure: flexible sigmoidoscopy Date of procedure: TBD   CHA2DS2-VASc Score = 3  This indicates a 3.2% annual risk of stroke. The patient's score is based upon: CHF History: No HTN History: Yes Diabetes History: No Stroke History: No Vascular Disease History: No Age Score: 2 Gender Score: 0    CrCl 63 Platelet count 232.  Per office protocol, patient can hold Xarelto for 1 days prior to procedure.    Patient will not need bridging with Lovenox (enoxaparin) around procedure.

## 2020-09-17 ENCOUNTER — Other Ambulatory Visit: Payer: Self-pay | Admitting: Physician Assistant

## 2020-09-17 MED ORDER — DILTIAZEM HCL ER BEADS 120 MG PO CP24: 120.0000 mg | ORAL_CAPSULE | Freq: Two times a day (BID) | ORAL | 2 refills | Status: DC

## 2021-02-03 ENCOUNTER — Other Ambulatory Visit: Payer: Self-pay | Admitting: Internal Medicine

## 2021-02-03 ENCOUNTER — Other Ambulatory Visit: Payer: Self-pay | Admitting: Physician Assistant

## 2021-02-03 DIAGNOSIS — I4821 Permanent atrial fibrillation: Secondary | ICD-10-CM

## 2021-02-03 NOTE — Telephone Encounter (Signed)
Prescription refill request for Xarelto received.  Indication: Afib  Last office visit:06/23/20 Arthur Obrien)  Weight: 86.9kg Age: 80 Scr: 1.13 (05/12/20) CrCl: 61.34ml/min  Appropriate dose and refill sent to requested pharmacy.

## 2021-07-06 ENCOUNTER — Encounter (INDEPENDENT_AMBULATORY_CARE_PROVIDER_SITE_OTHER): Payer: Self-pay | Admitting: *Deleted

## 2021-07-14 ENCOUNTER — Other Ambulatory Visit: Payer: Self-pay

## 2021-07-14 ENCOUNTER — Ambulatory Visit: Payer: Medicare Other | Admitting: Internal Medicine

## 2021-07-14 ENCOUNTER — Encounter: Payer: Self-pay | Admitting: Internal Medicine

## 2021-07-14 VITALS — BP 124/72 | HR 85 | Ht 70.0 in | Wt 189.4 lb

## 2021-07-14 DIAGNOSIS — I4821 Permanent atrial fibrillation: Secondary | ICD-10-CM | POA: Diagnosis not present

## 2021-07-14 DIAGNOSIS — R001 Bradycardia, unspecified: Secondary | ICD-10-CM | POA: Diagnosis not present

## 2021-07-14 NOTE — Progress Notes (Signed)
Patient Care Team: ?Rory Percy, MD as PCP - General (Family Medicine) ?Deboraha Sprang, MD as Consulting Physician (Clinical Cardiac Electrophysiology) ? ? ?HPI ? ?Arthur Obrien is a 81 y.o. male ?Seen in followup for atrial fibrillation now permanent with a moderately rapid ventricular response in the setting of sinus bradycardia. ?He underwent ileostomy with an end colostomy 1/19 for rectal cancer.  ? ?Saw RU-PA 1/22.  Smart watch with say that his heart rate was faster than 100.  Undertook an event recorder which showed an average heart rate of about 111 with some rates over 200.  She started him on  metoprolol succinate which "slowed him down " but not sure of benefit on HR,  tried at last visit on atenolol, bisoprolol &  metoprolol tartrate ?   ? ?Date Cr K Hgb  ?3/18    12.2  ?1/19 0.89  8.9  ?3/19 0.97  11.2  ?12/19 1.05 4.1 11.8  ?1/22 1.13 4.4 14.0  ?     ?  ?The patient denies chest pain, shortness of breath, nocturnal dyspnea, orthopnea or peripheral edema.  There have been no palpitations, lightheadedness or syncope.  Complains of .  ? ?DATE TEST EF   ?6/14 Echo   65 % LAE 47  ?     ? ?  ? ?Thromboembolic risk factors ( age  -2, HTN-1, ) for a CHADSVASc Score of 3 ? ? ?Past Medical History:  ?Diagnosis Date  ? Abdominal pain, epigastric 12/14/2016  ? Colon cancer ([place]) 1992  ? rectal stage 3  ? Colon polyps   ? Colon polyps   ? Family history of breast cancer   ? GERD (gastroesophageal reflux disease)   ? HTN (hypertension)   ? Hx of echocardiogram   ? a.  Echo (6/14):  EF 55-60%, mild-mod LAE, normal RV function, mild MR, RVSP 37 mmHg, mild pulmonary hypertension    ? Hypercholesterolemia 02/08/2012  ? PAF (paroxysmal atrial fibrillation) ([place]) 09/26/2012  ? Rectal cancer ([place]) 02/08/2012  ? Stage III cancer of the rectum with resection earlier in 1992, followed by combination chemotherapy with 5-FU, leucovorin and levamisole in conjunction with radiation therapy to the pelvis by Dr. Nila Nephew,  and he has had no evidence of recurrence thus far. His last colonoscopy was by Dr. Benson Norway in November 2010 and for his next one, he would like to see Dr. Hildred Laser.  Dr. Posey Rea  ? Sinus bradycardia 09/26/2012  ? UARS (upper airway resistance syndrome)   ? 08-21-17 SLEEP STUDY NO CPAP NEEDED  ? ? ?Past Surgical History:  ?Procedure Laterality Date  ? BIOPSY  01/10/2017  ? Procedure: BIOPSY;  Surgeon: Rogene Houston, MD;  [facility];  Service: Endoscopy;;  polyps @ body and fundus  ? CATARACT EXTRACTION Bilateral   ? right(years ago) left 06/2010  ? COLECTOMY N/A 05/11/2017  ? Procedure: OPEN COMPLETION COLECTOMY WITH ILEOSTOMY;  Surgeon: Ileana Roup, MD;  [facility];  Service: General;  Laterality: N/A;  ? COLON SURGERY    ? COLONOSCOPY N/A 01/10/2017  ? Procedure: COLONOSCOPY;  Surgeon: Rogene Houston, MD;  [facility];  Service: Endoscopy;  Laterality: N/A;  ? COLONOSCOPY WITH PROPOFOL N/A 02/28/2017  ? Procedure: COLONOSCOPY WITH PROPOFOL WITH TATTOO;  Surgeon: Ileana Roup, MD;  [facility]: [facility];  Service: General;  Laterality: N/A;  ? [place] N/A 05/11/2017  ? Procedure: CYSTOSCOPY WITH BILATERAL STENT PLACEMENT;  Surgeon:  Franchot Gallo, MD;  [facility];  Service: Urology;  Laterality: N/A;  ? ESOPHAGOGASTRODUODENOSCOPY N/A 01/10/2017  ? Procedure: ESOPHAGOGASTRODUODENOSCOPY (EGD);  Surgeon: Rogene Houston, MD;  [facility];  Service: Endoscopy;  Laterality: N/A;  12:45-rescheduled to 9/12 @ 2:00pm per Lelon Frohlich  ? FLEXIBLE SIGMOIDOSCOPY N/A 05/02/2018  ? Procedure: FLEXIBLE SIGMOIDOSCOPY;  Surgeon: Ileana Roup, MD;  [facility]: [facility];  Service: General;  Laterality: N/A;  ? ILEOSTOMY N/A 05/11/2017  ? Procedure: ILEOSTOMY;  Surgeon: Ileana Roup, MD;  [facility];  Service: General;  Laterality: N/A;  ? POLYPECTOMY  01/10/2017  ? Procedure: POLYPECTOMY;  Surgeon: Rogene Houston, MD;   [facility];  Service: Endoscopy;;  ascending colon, transverse colon, and splenic flexure  ? TONSILLECTOMY  AGE 36  ? ? ?Current Outpatient Medications  ?Medication Sig Dispense Refill  ? acetaminophen (TYLENOL) 500 MG tablet Take 1,000 mg by mouth every 6 (six) hours as needed for mild pain or headache.    ? atenolol (TENORMIN) 50 MG tablet TAKE ONE TABLET BY MOUTH EVERY DAY 90 tablet 1  ? Cyanocobalamin (VITAMIN B-12) 2500 MCG SUBL Place under the tongue.    ? DENTA 5000 PLUS 1.1 % CREA dental cream Take by mouth.    ? diltiazem (CARDIZEM) 30 MG tablet TAKE ONE TABLET BY MOUTH EVERY 4 HOURS AS NEEDED FOR HEART PALPITATIONS/RACING 90 tablet 2  ? diltiazem (TIAZAC) 120 MG 24 hr capsule Take 1 capsule (120 mg total) by mouth 2 (two) times daily. 180 capsule 2  ? famotidine (PEPCID) 20 MG tablet Take 20 mg by mouth as needed for heartburn or indigestion.    ? Hypromellose (ARTIFICIAL TEARS OP) Place 1 drop into both eyes every 4 (four) hours as needed (for dry eyes).     ? Multiple Vitamin (MULTIVITAMIN) tablet Take 1 tablet by mouth daily.    ? XARELTO 20 MG TABS tablet TAKE ONE TABLET BY MOUTH DAILY WITH SUPPER 90 tablet 2  ? ?No current facility-administered medications for this visit.  ? ? ?No Known Allergies ? ?Review of Systems negative except from HPI and PMH ? ?Physical Exam ?BP 124/72 (BP Location: Left Arm, Patient Position: Sitting, Cuff Size: Large)   Pulse 85   Ht '5\' 10"'$  (1.778 m)   Wt 189 lb 6.4 oz (85.9 kg)   SpO2 98%   BMI 27.18 kg/m?  ?Well developed and nourished in no acute distress ?HENT normal ?Neck supple with JVP-  flat  ?Clear ?Irregularly Irregular rate and rhythm with controlled  ventricular response  no murmurs or gallops ?Abd-soft with active BS ?No Clubbing cyanosis edema ?Skin-warm and dry ?A & Oriented  Grossly normal sensory and motor function ? ?ECG atrial fibrillation at 85 ?Intervals-/08/35 ? ? ?Assessment and  Plan ? ?Sinus bradycardia ? ?Atrial  fibrillation-permanent//RVR  ? ?Hypertension ? ?Anemia ? ?Sleep disordered breathing and daytime somnolence ? ?Status post colectomy with a colostomy for rectal cancer 1/19 ? ?  ?Permanent atrial fibrillation tolerating the atenolol much better than the metoprolol.  No bleeding on Xarelto continue at 20 mg. ? ?Blood pressure well controlled on the diltiazem 120 and the atenolol 50. ? ? ? ? ?

## 2021-07-14 NOTE — Patient Instructions (Signed)
Medication Instructions:  ?Your physician recommends that you continue on your current medications as directed. Please refer to the Current Medication list given to you today. ? ?*If you need a refill on your cardiac medications before your next appointment, please call your pharmacy* ? ? ?Lab Work: ?CBC and BMET by PCP ? ?If you have labs (blood work) drawn today and your tests are completely normal, you will receive your results only by: ?MyChart Message (if you have MyChart) OR ?A paper copy in the mail ?If you have any lab test that is abnormal or we need to change your treatment, we will call you to review the results. ? ? ?Testing/Procedures: ?Your physician recommends that you continue on your current medications as directed. Please refer to the Current Medication list given to you today. ? ? ? ?Follow-Up: ?At [facility], you and your health needs are our priority.  As part of our continuing mission to provide you with exceptional heart care, we have created designated Provider Care Teams.  These Care Teams include your primary Cardiologist (physician) and Advanced Practice Providers (APPs -  Physician Assistants and Nurse Practitioners) who all work together to provide you with the care you need, when you need it. ? ?We recommend signing up for the patient portal called "MyChart".  Sign up information is provided on this After Visit Summary.  MyChart is used to connect with patients for Virtual Visits (Telemedicine).  Patients are able to view lab/test results, encounter notes, upcoming appointments, etc.  Non-urgent messages can be sent to your provider as well.   ?To learn more about what you can do with MyChart, go to [email or web address].   ? ?Your next appointment:   ?12 months with Dr Caryl Comes ?

## 2021-08-02 ENCOUNTER — Other Ambulatory Visit: Payer: Self-pay | Admitting: Physician Assistant

## 2021-08-08 ENCOUNTER — Other Ambulatory Visit (INDEPENDENT_AMBULATORY_CARE_PROVIDER_SITE_OTHER): Payer: Self-pay

## 2021-08-08 ENCOUNTER — Encounter (INDEPENDENT_AMBULATORY_CARE_PROVIDER_SITE_OTHER): Payer: Self-pay | Admitting: Gastroenterology

## 2021-08-08 ENCOUNTER — Telehealth: Payer: Self-pay | Admitting: *Deleted

## 2021-08-08 ENCOUNTER — Ambulatory Visit (INDEPENDENT_AMBULATORY_CARE_PROVIDER_SITE_OTHER): Payer: Medicare Other | Admitting: Gastroenterology

## 2021-08-08 ENCOUNTER — Encounter (INDEPENDENT_AMBULATORY_CARE_PROVIDER_SITE_OTHER): Payer: Self-pay

## 2021-08-08 VITALS — BP 135/82 | HR 83 | Temp 97.6°F | Ht 70.0 in | Wt 187.7 lb

## 2021-08-08 DIAGNOSIS — R1013 Epigastric pain: Secondary | ICD-10-CM | POA: Diagnosis not present

## 2021-08-08 DIAGNOSIS — C20 Malignant neoplasm of rectum: Secondary | ICD-10-CM | POA: Diagnosis not present

## 2021-08-08 DIAGNOSIS — D126 Benign neoplasm of colon, unspecified: Secondary | ICD-10-CM | POA: Diagnosis not present

## 2021-08-08 NOTE — Patient Instructions (Signed)
Schedule EGD and flexible sigmoidoscopy ?

## 2021-08-08 NOTE — Telephone Encounter (Signed)
Patient previously cleared 08/2020. Last seen by Dr. Caryl Comes 07/14/21 with no concerns reported and no change to medical therapy.  ?

## 2021-08-08 NOTE — H&P (View-Only) (Signed)
Maylon Peppers, M.D. ?Gastroenterology & Hepatology ?[facility] ?[address] ?[place], [place] [address] ?Primary Care Physician: ?Rory Percy, MD ?[address] ?[place] [address] ? ?Referring MD: PCP, Nadeen Landau, MD ? ?Chief Complaint: Rectal cancer surveillance ? ?History of Present Illness: ?Arthur Obrien is a 81 y.o. male with Pmh rectal cancer, s/p resection CRTx, history of multiple colonic polyps status post total proctocolectomy and end ileostomy, hypertension, GERD, A-fib, hyperlipidemia, who presents for evaluation of rectal cancer surveillance. ? ?Patient underwent management of rectal cancer in 1992 with LAR followed by chemoradiation therapy.  Underwent surveillance colonoscopy after this and was found to have multiple polyps throughout the colon large-size, there were some polyps that could not be completely removed and he had to be referred for surgical resection, as well as management of intermittent small bowel obstructions.  On 05/11/2017, patient underwent total proctocolectomy and end ileostomy with small bowel resection and lysis of additions on by Dr. Dema Severin for management of unresectable colonic polyps multiple polyps in his colon.  Patient had presence of tubular adenomas with focal high-grade dysplasia and 5 tubular adenomas were present in the rest of the specimen that was resected.  There was no presence of malignancy in the resected tissue and 22 lymph nodes were negative for malignancy.  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  He underwent a flexible sigmoidoscopy on 05/02/2018 which showed presence of diversion proctitis.  He was recommended to have a repeat flexible sigmoidoscopy in 3 years to evaluate the rectal stump. ? ?Per notes, the patient had unremarkable genetic testing for colorectal cancer syndromes. ? ?Patient was referred to our office for  evaluation of abdominal pain and to perform surveillance of the rectal cuff. ? ?Patient reports that he has presented occasional episodes of abdominal pain (3-4 times in the last year). He reports that he has presented pain in the epigastric area that does not radiate anywhere else. He has taken Pepcid for the last 3 months and has not presented any more episodes of pain since then. 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 Had some nausea and vomiting when the episodes where present but these are not frequent. ? ?The patient denies having any nausea, vomiting, fever, chills, hematochezia, melena, hematemesis, abdominal distention, abdominal pain, diarrhea, jaundice, pruritus or weight loss. ? ?Last EGD: 2018  ?- Normal esophagus. ?- Z-line regular, 38 cm from the incisors. ?- 2 cm hiatal hernia. ?- A few gastric polyps. Biopsied - fundic gland polyp ?- Normal duodenal bulb and second portion of the duodenum. ?Last flex sig: as above ? ?FHx: neg for any gastrointestinal/liver disease, father multiple myeloma, daughter breast cancer ?Social: neg smoking, alcohol or illicit drug use ?Surgical: appendectomy, rectal cancer resection and proctocolectomy with end jejunostomy, tonsillectomy ? ?Past Medical History: ?Past Medical History:  ?Diagnosis Date  ? Abdominal pain, epigastric 12/14/2016  ? Colon cancer ([place]) 1992  ? rectal stage 3  ? Colon polyps   ? Colon polyps   ? Family history of breast cancer   ? GERD (gastroesophageal reflux disease)   ? HTN (hypertension)   ? Hx of echocardiogram   ? a.  Echo (6/14):  EF 55-60%, mild-mod LAE, normal RV function, mild MR, RVSP 37 mmHg, mild pulmonary hypertension    ? Hypercholesterolemia 02/08/2012  ? PAF (paroxysmal atrial fibrillation) ([place]) 09/26/2012  ?  Rectal cancer ([place]) 02/08/2012  ? Stage III cancer of the rectum with resection earlier in 1992, followed by combination chemotherapy with 5-FU, leucovorin and levamisole in conjunction  with radiation therapy to the pelvis by Dr. Nila Nephew, and he has had no evidence of recurrence thus far. His last colonoscopy was by Dr. Benson Norway in November 2010 and for his next one, he would like to see Dr. Hildred Laser.  Dr. Posey Rea  ? Sinus bradycardia 09/26/2012  ? UARS (upper airway resistance syndrome)   ? 08-21-17 SLEEP STUDY NO CPAP NEEDED  ? ? ?Past Surgical History: ?Past Surgical History:  ?Procedure Laterality Date  ? BIOPSY  01/10/2017  ? Procedure: BIOPSY;  Surgeon: Rogene Houston, MD;  [facility];  Service: Endoscopy;;  polyps @ body and fundus  ? CATARACT EXTRACTION Bilateral   ? right(years ago) left 06/2010  ? COLECTOMY N/A 05/11/2017  ? Procedure: OPEN COMPLETION COLECTOMY WITH ILEOSTOMY;  Surgeon: Ileana Roup, MD;  [facility]: [facility];  Service: General;  Laterality: N/A;  ? COLON SURGERY    ? COLONOSCOPY N/A 01/10/2017  ? Procedure: COLONOSCOPY;  Surgeon: Rogene Houston, MD;  [facility];  Service: Endoscopy;  Laterality: N/A;  ? COLONOSCOPY WITH PROPOFOL N/A 02/28/2017  ? Procedure: COLONOSCOPY WITH PROPOFOL WITH TATTOO;  Surgeon: Ileana Roup, MD;  Location: [facility];  Service: General;  Laterality: N/A;  ? [place] N/A 05/11/2017  ? Procedure: CYSTOSCOPY WITH BILATERAL STENT PLACEMENT;  Surgeon: Franchot Gallo, MD;  [facility]: [facility];  Service: Urology;  Laterality: N/A;  ? ESOPHAGOGASTRODUODENOSCOPY N/A 01/10/2017  ? Procedure: ESOPHAGOGASTRODUODENOSCOPY (EGD);  Surgeon: Rogene Houston, MD;  [facility];  Service: Endoscopy;  Laterality: N/A;  12:45-rescheduled to 9/12 @ 2:00pm per Lelon Frohlich  ? FLEXIBLE SIGMOIDOSCOPY N/A 05/02/2018  ? Procedure: FLEXIBLE SIGMOIDOSCOPY;  Surgeon: Ileana Roup, MD;  Location: [facility];  Service: General;  Laterality: N/A;  ? ILEOSTOMY N/A 05/11/2017  ? Procedure: ILEOSTOMY;  Surgeon: Ileana Roup, MD;  [facility];  Service: General;  Laterality: N/A;   ? POLYPECTOMY  01/10/2017  ? Procedure: POLYPECTOMY;  Surgeon: Rogene Houston, MD;  [facility];  Service: Endoscopy;;  ascending colon, transverse colon, and splenic flexure  ? TONSILLECTOMY  AGE 70  ? ? ?Family History: ?Family History  ?Problem Relation Age of Onset  ? Hypertension Mother   ? Congestive Heart Failure Mother   ? Cancer Father   ?     multiple myeloma  ? Breast cancer Daughter 13  ?     died at 26, reportedly tested neg for breast cancer genes in 2008  ? Cancer Paternal Aunt   ?     NOS  ? Heart attack Neg Hx   ? Stroke Neg Hx   ? ? ?Social History: ?Social History  ? ?Tobacco Use  ?Smoking Status Never  ? Passive exposure: Never  ?Smokeless Tobacco Never  ? ?Social History  ? ?Substance and Sexual Activity  ?Alcohol Use No  ? ?Social History  ? ?Substance and Sexual Activity  ?Drug Use No  ? ? ?Allergies: ?No Known Allergies ? ?Medications: ?Current Outpatient Medications  ?Medication Sig Dispense Refill  ? acetaminophen (TYLENOL) 500 MG tablet Take 1,000 mg by mouth every 6 (six) hours as needed for mild pain or headache.    ? atenolol (TENORMIN) 50 MG tablet TAKE ONE TABLET BY MOUTH EVERY DAY 90 tablet 1  ?  Cyanocobalamin (VITAMIN B-12) 2500 MCG SUBL Place under the tongue.    ? DENTA 5000 PLUS 1.1 % CREA dental cream Take by mouth.    ? diltiazem (CARDIZEM) 30 MG tablet TAKE ONE TABLET BY MOUTH EVERY 4 HOURS AS NEEDED FOR HEART PALPITATIONS/RACING 90 tablet 2  ? famotidine (PEPCID) 20 MG tablet Take 20 mg by mouth daily.    ? Hypromellose (ARTIFICIAL TEARS OP) Place 1 drop into both eyes every 4 (four) hours as needed (for dry eyes).     ? Multiple Vitamin (MULTIVITAMIN) tablet Take 1 tablet by mouth daily.    ? TIADYLT ER 120 MG 24 hr capsule TAKE ONE CAPSULE BY MOUTH TWICE DAILY 180 capsule 2  ? XARELTO 20 MG TABS tablet TAKE ONE TABLET BY MOUTH DAILY WITH SUPPER 90 tablet 2  ? ?No current facility-administered medications for this visit.  ? ? ?Review of Systems: ?GENERAL:  negative for malaise, night sweats ?HEENT: No changes in hearing or vision, no nose bleeds or other nasal problems. ?NECK: Negative for lumps, goiter, pain and significant neck swelling ?RESPIRATORY: Negati

## 2021-08-08 NOTE — Telephone Encounter (Signed)
I was able to reach the pt on alternate # on file. I have reviewed that he has been cleared. I also have reviewed the recommendations ok to hold Xarelto x 2 days prior and he will resume Xarelto once felt safe by Dr. Jenetta Downer. I did advise pt of the recommendations from pre op provider today Christen Bame, NP; that if he develops any new symptoms prior to his procedure he needs to call our office for an appt.  ? ?Pt has verbalized understanding to all parts of this conversation.  ?I will update the pre op provider that I have s/w the pt.  ?

## 2021-08-08 NOTE — Telephone Encounter (Signed)
Patient with diagnosis of afib on Xarelto for anticoagulation.   ? ?Procedure: FLEX SIGMOID & UPPER ENDOSCOPY ?Date of procedure: 08/26/21 ? ?CHA2DS2-VASc Score = 3  ?This indicates a 3.2% annual risk of stroke. ?The patient's score is based upon: ?CHF History: 0 ?HTN History: 1 ?Diabetes History: 0 ?Stroke History: 0 ?Vascular Disease History: 0 ?Age Score: 2 ?Gender Score: 0 ?  ?CrCl 1m/min ?Platelet count 196K ? ?Per office protocol, patient can hold Xarelto for 2 days prior to procedure as requested. ? ?

## 2021-08-08 NOTE — Progress Notes (Signed)
Arthur Obrien, M.D. ?Gastroenterology & Hepatology ?[facility] ?[address] ?[place], [place] [address] ?Primary Care Physician: ?Rory Percy, MD ?[address] ?[place] [address] ? ?Referring MD: PCP, Nadeen Landau, MD ? ?Chief Complaint: Rectal cancer surveillance ? ?History of Present Illness: ?Arthur Obrien is a 81 y.o. male with Pmh rectal cancer, s/p resection CRTx, history of multiple colonic polyps status post total proctocolectomy and end ileostomy, hypertension, GERD, A-fib, hyperlipidemia, who presents for evaluation of rectal cancer surveillance. ? ?Patient underwent management of rectal cancer in 1992 with LAR followed by chemoradiation therapy.  Underwent surveillance colonoscopy after this and was found to have multiple polyps throughout the colon large-size, there were some polyps that could not be completely removed and he had to be referred for surgical resection, as well as management of intermittent small bowel obstructions.  On 05/11/2017, patient underwent total proctocolectomy and end ileostomy with small bowel resection and lysis of additions on by Dr. Dema Severin for management of unresectable colonic polyps multiple polyps in his colon.  Patient had presence of tubular adenomas with focal high-grade dysplasia and 5 tubular adenomas were present in the rest of the specimen that was resected.  There was no presence of malignancy in the resected tissue and 22 lymph nodes were negative for malignancy.  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  He underwent a flexible sigmoidoscopy on 05/02/2018 which showed presence of diversion proctitis.  He was recommended to have a repeat flexible sigmoidoscopy in 3 years to evaluate the rectal stump. ? ?Per notes, the patient had unremarkable genetic testing for colorectal cancer syndromes. ? ?Patient was referred to our office for  evaluation of abdominal pain and to perform surveillance of the rectal cuff. ? ?Patient reports that he has presented occasional episodes of abdominal pain (3-4 times in the last year). He reports that he has presented pain in the epigastric area that does not radiate anywhere else. He has taken Pepcid for the last 3 months and has not presented any more episodes of pain since then. 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 Had some nausea and vomiting when the episodes where present but these are not frequent. ? ?The patient denies having any nausea, vomiting, fever, chills, hematochezia, melena, hematemesis, abdominal distention, abdominal pain, diarrhea, jaundice, pruritus or weight loss. ? ?Last EGD: 2018  ?- Normal esophagus. ?- Z-line regular, 38 cm from the incisors. ?- 2 cm hiatal hernia. ?- A few gastric polyps. Biopsied - fundic gland polyp ?- Normal duodenal bulb and second portion of the duodenum. ?Last flex sig: as above ? ?FHx: neg for any gastrointestinal/liver disease, father multiple myeloma, daughter breast cancer ?Social: neg smoking, alcohol or illicit drug use ?Surgical: appendectomy, rectal cancer resection and proctocolectomy with end jejunostomy, tonsillectomy ? ?Past Medical History: ?Past Medical History:  ?Diagnosis Date  ? Abdominal pain, epigastric 12/14/2016  ? Colon cancer ([place]) 1992  ? rectal stage 3  ? Colon polyps   ? Colon polyps   ? Family history of breast cancer   ? GERD (gastroesophageal reflux disease)   ? HTN (hypertension)   ? Hx of echocardiogram   ? a.  Echo (6/14):  EF 55-60%, mild-mod LAE, normal RV function, mild MR, RVSP 37 mmHg, mild pulmonary hypertension    ? Hypercholesterolemia 02/08/2012  ? PAF (paroxysmal atrial fibrillation) ([place]) 09/26/2012  ?  Rectal cancer ([place]) 02/08/2012  ? Stage III cancer of the rectum with resection earlier in 1992, followed by combination chemotherapy with 5-FU, leucovorin and levamisole in conjunction  with radiation therapy to the pelvis by Dr. Nila Nephew, and he has had no evidence of recurrence thus far. His last colonoscopy was by Dr. Benson Norway in November 2010 and for his next one, he would like to see Dr. Hildred Laser.  Dr. Posey Rea  ? Sinus bradycardia 09/26/2012  ? UARS (upper airway resistance syndrome)   ? 08-21-17 SLEEP STUDY NO CPAP NEEDED  ? ? ?Past Surgical History: ?Past Surgical History:  ?Procedure Laterality Date  ? BIOPSY  01/10/2017  ? Procedure: BIOPSY;  Surgeon: Rogene Houston, MD;  [facility];  Service: Endoscopy;;  polyps @ body and fundus  ? CATARACT EXTRACTION Bilateral   ? right(years ago) left 06/2010  ? COLECTOMY N/A 05/11/2017  ? Procedure: OPEN COMPLETION COLECTOMY WITH ILEOSTOMY;  Surgeon: Ileana Roup, MD;  [facility];  Service: General;  Laterality: N/A;  ? COLON SURGERY    ? COLONOSCOPY N/A 01/10/2017  ? Procedure: COLONOSCOPY;  Surgeon: Rogene Houston, MD;  [facility];  Service: Endoscopy;  Laterality: N/A;  ? COLONOSCOPY WITH PROPOFOL N/A 02/28/2017  ? Procedure: COLONOSCOPY WITH PROPOFOL WITH TATTOO;  Surgeon: Ileana Roup, MD;  Location: [facility];  Service: General;  Laterality: N/A;  ? [place] N/A 05/11/2017  ? Procedure: CYSTOSCOPY WITH BILATERAL STENT PLACEMENT;  Surgeon: Franchot Gallo, MD;  [facility];  Service: Urology;  Laterality: N/A;  ? ESOPHAGOGASTRODUODENOSCOPY N/A 01/10/2017  ? Procedure: ESOPHAGOGASTRODUODENOSCOPY (EGD);  Surgeon: Rogene Houston, MD;  [facility];  Service: Endoscopy;  Laterality: N/A;  12:45-rescheduled to 9/12 @ 2:00pm per Lelon Frohlich  ? FLEXIBLE SIGMOIDOSCOPY N/A 05/02/2018  ? Procedure: FLEXIBLE SIGMOIDOSCOPY;  Surgeon: Ileana Roup, MD;  Location: [facility];  Service: General;  Laterality: N/A;  ? ILEOSTOMY N/A 05/11/2017  ? Procedure: ILEOSTOMY;  Surgeon: Ileana Roup, MD;  [facility];  Service: General;  Laterality: N/A;   ? POLYPECTOMY  01/10/2017  ? Procedure: POLYPECTOMY;  Surgeon: Rogene Houston, MD;  [facility];  Service: Endoscopy;;  ascending colon, transverse colon, and splenic flexure  ? TONSILLECTOMY  AGE 70  ? ? ?Family History: ?Family History  ?Problem Relation Age of Onset  ? Hypertension Mother   ? Congestive Heart Failure Mother   ? Cancer Father   ?     multiple myeloma  ? Breast cancer Daughter 13  ?     died at 26, reportedly tested neg for breast cancer genes in 2008  ? Cancer Paternal Aunt   ?     NOS  ? Heart attack Neg Hx   ? Stroke Neg Hx   ? ? ?Social History: ?Social History  ? ?Tobacco Use  ?Smoking Status Never  ? Passive exposure: Never  ?Smokeless Tobacco Never  ? ?Social History  ? ?Substance and Sexual Activity  ?Alcohol Use No  ? ?Social History  ? ?Substance and Sexual Activity  ?Drug Use No  ? ? ?Allergies: ?No Known Allergies ? ?Medications: ?Current Outpatient Medications  ?Medication Sig Dispense Refill  ? acetaminophen (TYLENOL) 500 MG tablet Take 1,000 mg by mouth every 6 (six) hours as needed for mild pain or headache.    ? atenolol (TENORMIN) 50 MG tablet TAKE ONE TABLET BY MOUTH EVERY DAY 90 tablet 1  ?  Cyanocobalamin (VITAMIN B-12) 2500 MCG SUBL Place under the tongue.    ? DENTA 5000 PLUS 1.1 % CREA dental cream Take by mouth.    ? diltiazem (CARDIZEM) 30 MG tablet TAKE ONE TABLET BY MOUTH EVERY 4 HOURS AS NEEDED FOR HEART PALPITATIONS/RACING 90 tablet 2  ? famotidine (PEPCID) 20 MG tablet Take 20 mg by mouth daily.    ? Hypromellose (ARTIFICIAL TEARS OP) Place 1 drop into both eyes every 4 (four) hours as needed (for dry eyes).     ? Multiple Vitamin (MULTIVITAMIN) tablet Take 1 tablet by mouth daily.    ? TIADYLT ER 120 MG 24 hr capsule TAKE ONE CAPSULE BY MOUTH TWICE DAILY 180 capsule 2  ? XARELTO 20 MG TABS tablet TAKE ONE TABLET BY MOUTH DAILY WITH SUPPER 90 tablet 2  ? ?No current facility-administered medications for this visit.  ? ? ?Review of Systems: ?GENERAL:  negative for malaise, night sweats ?HEENT: No changes in hearing or vision, no nose bleeds or other nasal problems. ?NECK: Negative for lumps, goiter, pain and significant neck swelling ?RESPIRATORY: Negati

## 2021-08-08 NOTE — Telephone Encounter (Signed)
? ?  Pre-operative Risk Assessment  ?  ?Patient Name: Arthur Obrien  ?DOB: Apr 29, 1941 ?MRN: [id number]  ? ?  ? ?Request for Surgical Clearance   ? ?Procedure:   FLEX SIGMOID & UPPER ENDOSCOPY ? ?Date of Surgery:  Clearance 08/26/21                              ?   ?Surgeon:  DR. DANIEL CASTANEDA ?Surgeon's Group or Practice Name:  [facility] ?Phone number:  [phone] ?Fax number:  [phone] ?  ?Type of Clearance Requested:   ?- Medical  ?- Pharmacy:  Hold Rivaroxaban (Xarelto) x 2 DAYS PRIOR ?  ?Type of Anesthesia:  MAC ?  ?Additional requests/questions:   ? ?Signed, ?Julaine Hua   ?08/08/2021, 3:43 PM  ? ?

## 2021-08-08 NOTE — Telephone Encounter (Signed)
? ?  Primary Cardiologist: None ? ?Chart reviewed as part of pre-operative protocol coverage. Given past medical history and time since last visit, based on ACC/AHA guidelines, Arthur Obrien would be at acceptable risk for the planned procedure without further cardiovascular testing.  ? ?Patient was advised that if he develops new symptoms prior to surgery to contact our office to arrange a follow-up appointment.  He verbalized understanding. ? ?I will route this recommendation to the requesting party via Epic fax function and remove from pre-op pool. ? ?Please call with questions. ? ?Emmaline Life, NP-C ? ?  ?08/08/2021, 4:29 PM ?[facility] ?[address], [address] ?Office [phone] Fax [phone] ? ? ?

## 2021-08-08 NOTE — Telephone Encounter (Signed)
Thanks. ?Arthur Obrien, please let the patient know about these recs ?

## 2021-08-09 ENCOUNTER — Encounter (INDEPENDENT_AMBULATORY_CARE_PROVIDER_SITE_OTHER): Payer: Self-pay

## 2021-08-23 NOTE — Patient Instructions (Signed)
? ? ? ? ? ? ? ? ? ? Arthur Obrien ? 08/23/2021  ?  ? '@PREFPERIOPPHARMACY'$ @ ? ? Your procedure is scheduled on  09/06/2021. ? ? Report to [facility] at  1245  A.M. ? ? Call this number if you have problems the morning of surgery: ? [phone] ? ? Remember: ? Follow the diet and prp instructions given to you by the office. ?  ? Take these medicines the morning of surgery with A SIP OF WATER   ?   ?                  atenolol, diltiazem, pepcid, tiadget. ?  ? Do not wear jewelry, make-up or nail polish. ? Do not wear lotions, powders, or perfumes, or deodorant. ? Do not shave 48 hours prior to surgery.  Men may shave face and neck. ? Do not bring valuables to the hospital. ? [facility] is not responsible for any belongings or valuables. ? ?Contacts, dentures or bridgework may not be worn into surgery.  Leave your suitcase in the car.  After surgery it may be brought to your room. ? ?For patients admitted to the hospital, discharge time will be determined by your treatment team. ? ? ?Please read over the following fact sheets that you were given. ?Anesthesia Post-op Instructions and Care and Recovery After Surgery ?  ? ? ? Upper Endoscopy, Adult, Care After ?This sheet gives you information about how to care for yourself after your procedure. Your health care provider may also give you more specific instructions. If you have problems or questions, contact your health care provider. ?What can I expect after the procedure? ?After the procedure, it is common to have: ?A sore throat. ?Mild stomach pain or discomfort. ?Bloating. ?Nausea. ?Follow these instructions at home: ? ?Follow instructions from your health care provider about what to eat or drink after your procedure. ?Return to your normal activities as told by your health care provider. Ask your health care provider what activities are safe for you. ?Take over-the-counter and prescription medicines only as told by your health care provider. ?If you were given a  sedative during the procedure, it can affect you for several hours. Do not drive or operate machinery until your health care provider says that it is safe. ?Keep all follow-up visits as told by your health care provider. This is important. ?Contact a health care provider if you have: ?A sore throat that lasts longer than one day. ?Trouble swallowing. ?Get help right away if: ?You vomit blood or your vomit looks like coffee grounds. ?You have: ?A fever. ?Bloody, black, or tarry stools. ?A severe sore throat or you cannot swallow. ?Difficulty breathing. ?Severe pain in your chest or abdomen. ?Summary ?After the procedure, it is common to have a sore throat, mild stomach discomfort, bloating, and nausea. ?If you were given a sedative during the procedure, it can affect you for several hours. Do not drive or operate machinery until your health care provider says that it is safe. ?Follow instructions from your health care provider about what to eat or drink after your procedure. ?Return to your normal activities as told by your health care provider. ?This information is not intended to replace advice given to you by your health care provider. Make sure you discuss any questions you have with your health care provider. ?Document Revised: 02/21/2019 Document Reviewed: 09/17/2017 ?Elsevier Patient Education ? [place]. ?Flexible Sigmoidoscopy, Care After ?This sheet gives you information  about how to care for yourself after your procedure. Your health care provider may also give you more specific instructions. If you have problems or questions, contact your health care provider. ?What can I expect after the procedure? ?After the procedure, it is common to have: ?Cramping or pain in your abdomen. ?Bloating. ?A small amount of blood with your bowel movements. This may happen if a sample of tissue was removed for testing (biopsy). ?Follow these instructions at home: ?Eating and drinking ? ?Drink enough fluid to keep  your urine pale yellow. ?Follow instructions from your health care provider about eating or drinking restrictions. ?Resume your normal diet as instructed by your health care provider. Avoid heavy or fried foods that are hard to digest. ?Activity ? ?If you were given a medicine to help you relax (sedative) during the procedure, it can affect you for several hours. Do not drive or operate machinery until your health care provider says that it is safe. ?Rest as told by your health care provider. ?Return to your normal activities as told by your health care provider. Ask your health care provider what activities are safe for you. ?General instructions ?Take over-the-counter and prescription medicines only as told by your health care provider. ?Try walking around when you have cramps or feel bloated. ?Keep all follow-up visits as told by your health care provider. This is important. ?Contact a health care provider if: ?You have pain or cramping in your abdomen that gets worse or is not helped with medicine. ?You have a small amount of bleeding from your rectum that continues after 24 hours. ?You have nausea or vomiting. ?You feel weak or dizzy. ?You develop a fever. ?Get help right away if: ?You pass large blood clots or see a large amount of blood in the toilet after having a bowel movement. ?You have severe pain in your abdomen. ?You have nausea or vomiting for more than 24 hours after the procedure. ?Summary ?After the procedure, you may have cramping or pain in your abdomen or you may have bloating. If you had a sample of tissue removed (biopsy), you may have a small amount of blood with your bowel movements. ?Resume your normal diet as instructed by your health care provider. Avoid heavy or fried foods that are hard to digest. ?Try walking around when you have cramps or feel bloated. ?Get help right away if you pass large blood clots or see a large amount of blood in the toilet after having a bowel movement. ?This  information is not intended to replace advice given to you by your health care provider. Make sure you discuss any questions you have with your health care provider. ?Document Revised: 04/14/2019 Document Reviewed: 04/14/2019 ?Elsevier Patient Education ? [place]. ?Colonoscopy, Adult, Care After ?The following information offers guidance on how to care for yourself after your procedure. Your health care provider may also give you more specific instructions. If you have problems or questions, contact your health care provider. ?What can I expect after the procedure? ?After the procedure, it is common to have: ?A small amount of blood in your stool for 24 hours after the procedure. ?Some gas. ?Mild cramping or bloating of your abdomen. ?Follow these instructions at home: ?Eating and drinking ? ?Drink enough fluid to keep your urine pale yellow. ?Follow instructions from your health care provider about eating or drinking restrictions. ?Resume your normal diet as told by your health care provider. Avoid heavy or fried foods that are hard to digest. ?  Activity ?Rest as told by your health care provider. ?Avoid sitting for a long time without moving. Get up to take short walks every 1-2 hours. This is important to improve blood flow and breathing. Ask for help if you feel weak or unsteady. ?Return to your normal activities as told by your health care provider. Ask your health care provider what activities are safe for you. ?Managing cramping and bloating ? ?Try walking around when you have cramps or feel bloated. ?If directed, apply heat to your abdomen as told by your health care provider. Use the heat source that your health care provider recommends, such as a moist heat pack or a heating pad. ?Place a towel between your skin and the heat source. ?Leave the heat on for 20-30 minutes. ?Remove the heat if your skin turns bright red. This is especially important if you are unable to feel pain, heat, or cold. You  have a greater risk of getting burned. ?General instructions ?If you were given a sedative during the procedure, it can affect you for several hours. Do not drive or operate machinery until your health care

## 2021-08-24 ENCOUNTER — Encounter (HOSPITAL_COMMUNITY)
Admission: RE | Admit: 2021-08-24 | Discharge: 2021-08-24 | Disposition: A | Discharge status: Home / Self Care | Payer: Medicare Other | Source: Ambulatory Visit | Attending: Gastroenterology | Admitting: Gastroenterology

## 2021-08-26 ENCOUNTER — Other Ambulatory Visit: Payer: Self-pay

## 2021-08-26 ENCOUNTER — Encounter (HOSPITAL_COMMUNITY): Payer: Self-pay

## 2021-09-01 ENCOUNTER — Other Ambulatory Visit: Payer: Self-pay

## 2021-09-01 MED ORDER — ATENOLOL 50 MG PO TABS: 50.0000 mg | ORAL_TABLET | Freq: Every day | ORAL | 3 refills | Status: DC

## 2021-09-02 ENCOUNTER — Encounter (HOSPITAL_COMMUNITY)
Admission: RE | Admit: 2021-09-02 | Discharge: 2021-09-02 | Disposition: A | Discharge status: Home / Self Care | Payer: Medicare Other | Source: Ambulatory Visit | Attending: Gastroenterology | Admitting: Gastroenterology

## 2021-09-06 ENCOUNTER — Ambulatory Visit (HOSPITAL_COMMUNITY)
Admission: RE | Admit: 2021-09-06 | Discharge: 2021-09-06 | Disposition: A | Discharge status: Home / Self Care | Payer: Medicare Other | Attending: Gastroenterology | Admitting: Gastroenterology

## 2021-09-06 ENCOUNTER — Encounter (HOSPITAL_COMMUNITY): Payer: Self-pay | Admitting: Gastroenterology

## 2021-09-06 ENCOUNTER — Ambulatory Visit (HOSPITAL_BASED_OUTPATIENT_CLINIC_OR_DEPARTMENT_OTHER): Payer: Medicare Other | Admitting: Anesthesiology

## 2021-09-06 ENCOUNTER — Encounter (HOSPITAL_COMMUNITY)
Admission: RE | Disposition: A | Discharge status: Home / Self Care | Payer: Self-pay | Source: Home / Self Care | Attending: Gastroenterology

## 2021-09-06 ENCOUNTER — Ambulatory Visit (HOSPITAL_COMMUNITY): Payer: Medicare Other | Admitting: Anesthesiology

## 2021-09-06 DIAGNOSIS — Z79899 Other long term (current) drug therapy: Secondary | ICD-10-CM | POA: Diagnosis not present

## 2021-09-06 DIAGNOSIS — R1013 Epigastric pain: Secondary | ICD-10-CM

## 2021-09-06 DIAGNOSIS — K3189 Other diseases of stomach and duodenum: Secondary | ICD-10-CM

## 2021-09-06 DIAGNOSIS — I480 Paroxysmal atrial fibrillation: Secondary | ICD-10-CM | POA: Diagnosis not present

## 2021-09-06 DIAGNOSIS — Z08 Encounter for follow-up examination after completed treatment for malignant neoplasm: Secondary | ICD-10-CM

## 2021-09-06 DIAGNOSIS — K219 Gastro-esophageal reflux disease without esophagitis: Secondary | ICD-10-CM | POA: Insufficient documentation

## 2021-09-06 DIAGNOSIS — Z85038 Personal history of other malignant neoplasm of large intestine: Secondary | ICD-10-CM | POA: Diagnosis not present

## 2021-09-06 DIAGNOSIS — Z7901 Long term (current) use of anticoagulants: Secondary | ICD-10-CM | POA: Diagnosis not present

## 2021-09-06 DIAGNOSIS — Z9221 Personal history of antineoplastic chemotherapy: Secondary | ICD-10-CM | POA: Insufficient documentation

## 2021-09-06 DIAGNOSIS — Z923 Personal history of irradiation: Secondary | ICD-10-CM | POA: Diagnosis not present

## 2021-09-06 DIAGNOSIS — Z1211 Encounter for screening for malignant neoplasm of colon: Secondary | ICD-10-CM | POA: Insufficient documentation

## 2021-09-06 DIAGNOSIS — K6289 Other specified diseases of anus and rectum: Secondary | ICD-10-CM | POA: Insufficient documentation

## 2021-09-06 DIAGNOSIS — I4891 Unspecified atrial fibrillation: Secondary | ICD-10-CM

## 2021-09-06 DIAGNOSIS — I10 Essential (primary) hypertension: Secondary | ICD-10-CM | POA: Diagnosis not present

## 2021-09-06 HISTORY — PX: ESOPHAGOGASTRODUODENOSCOPY (EGD) WITH PROPOFOL: SHX5813

## 2021-09-06 HISTORY — PX: FLEXIBLE SIGMOIDOSCOPY: SHX5431

## 2021-09-06 HISTORY — PX: BIOPSY: SHX5522

## 2021-09-06 SURGERY — ESOPHAGOGASTRODUODENOSCOPY (EGD) WITH PROPOFOL
Anesthesia: General

## 2021-09-06 MED ORDER — PROPOFOL 10 MG/ML IV BOLUS
INTRAVENOUS | Status: DC | PRN
  Administered 2021-09-06: 80 mg via INTRAVENOUS

## 2021-09-06 MED ORDER — LACTATED RINGERS IV SOLN: INTRAVENOUS | Status: DC | PRN

## 2021-09-06 MED ORDER — PROPOFOL 500 MG/50ML IV EMUL
INTRAVENOUS | Status: DC | PRN
  Administered 2021-09-06: 200 ug/kg/min via INTRAVENOUS

## 2021-09-06 MED ORDER — LIDOCAINE 2% (20 MG/ML) 5 ML SYRINGE
INTRAMUSCULAR | Status: DC | PRN
  Administered 2021-09-06: 50 mg via INTRAVENOUS

## 2021-09-06 MED ORDER — PHENYLEPHRINE 80 MCG/ML (10ML) SYRINGE FOR IV PUSH (FOR BLOOD PRESSURE SUPPORT)
PREFILLED_SYRINGE | INTRAVENOUS | Status: DC | PRN
  Administered 2021-09-06 (×3): 80 ug via INTRAVENOUS

## 2021-09-06 NOTE — Op Note (Signed)
[facility] ?Patient Name: Arthur Obrien ?Procedure Date: 09/06/2021 2:09 PM ?MRN: [id number] ?Date of Birth: 1940/05/04 ?Attending MD: Maylon Peppers ,  ?CSN: [id number] ?Age: 81 ?Admit Type: Outpatient ?Procedure:                Upper GI endoscopy ?Indications:              Epigastric abdominal pain ?Providers:                Maylon Peppers, Rosina Lowenstein, RN, Caprice Kluver ?Referring MD:              ?Medicines:                Monitored Anesthesia Care ?Complications:            No immediate complications. ?Estimated Blood Loss:     Estimated blood loss: none. ?Procedure:                Pre-Anesthesia Assessment: ?                          - Prior to the procedure, a History and Physical  ?                          was performed, and patient medications, allergies  ?                          and sensitivities were reviewed. The patient's  ?                          tolerance of previous anesthesia was reviewed. ?                          - The risks and benefits of the procedure and the  ?                          sedation options and risks were discussed with the  ?                          patient. All questions were answered and informed  ?                          consent was obtained. ?                          After obtaining informed consent, the endoscope was  ?                          passed under direct vision. Throughout the  ?                          procedure, the patient's blood pressure, pulse, and  ?                          oxygen saturations were monitored continuously. The  ?                          [id number]-[id number] ([id number]) scope  was introduced through the  ?                          mouth, and advanced to the second part of duodenum.  ?                          The upper GI endoscopy was accomplished without  ?                          difficulty. The patient tolerated the procedure  ?                          well. ?Scope In: ?Scope Out: 2:25:57 PM ?Findings: ?     The examined esophagus was  normal. ?     A few localized small erosions with no stigmata of recent bleeding were  ?     found in the gastric body. Biopsies were taken with a cold forceps for  ?     Helicobacter pylori testing. ?     The examined duodenum was normal. ?Impression:               - Normal esophagus. ?                          - Erosive gastropathy with no stigmata of recent  ?                          bleeding. Biopsied. ?                          - Normal examined duodenum. ?Moderate Sedation: ?     Per Anesthesia Care ?Recommendation:           - Discharge patient to home (ambulatory). ?                          - Resume previous diet. ?                          - Await pathology results. ?                          - Continue present medications. ?                          - H. pylori serology ?Procedure Code(s):        --- Professional --- ?                          [phone], Esophagogastroduodenoscopy, flexible,  ?                          transoral; with biopsy, single or multiple ?Diagnosis Code(s):        --- Professional --- ?                          K31.89, Other diseases of stomach and duodenum ?  R10.13, Epigastric pain ?CPT copyright 2019 American Medical Association. All rights reserved. ?The codes documented in this report are preliminary and upon coder review may  ?be revised to meet current compliance requirements. ?Maylon Peppers, MD ?Maylon Peppers,  ?09/06/2021 2:29:35 PM ?This report has been signed electronically. ?Number of Addenda: 0 ?

## 2021-09-06 NOTE — Transfer of Care (Signed)
Immediate Anesthesia Transfer of Care Note ? ?Patient: Arthur Obrien ? ?Procedure(s) Performed: ESOPHAGOGASTRODUODENOSCOPY (EGD) WITH PROPOFOL ?FLEXIBLE SIGMOIDOSCOPY ?BIOPSY ? ?Patient Location: Short Stay ? ?Anesthesia Type:MAC ? ?Level of Consciousness: awake, alert , oriented and patient cooperative ? ?Airway & Oxygen Therapy: Patient Spontanous Breathing and Patient connected to nasal cannula oxygen ? ?Post-op Assessment: Report given to RN, Post -op Vital signs reviewed and stable and Patient moving all extremities ? ?Post vital signs: Reviewed and stable ? ?Last Vitals:  ?Vitals Value Taken Time  ?BP    ?Temp 36.4 ?C 09/06/21 1439  ?Pulse 85 09/06/21 1439  ?Resp 18 09/06/21 1439  ?SpO2 96 % 09/06/21 1439  ? ? ?Last Pain:  ?Vitals:  ? 09/06/21 1439  ?TempSrc: Oral  ?PainSc: 0-No pain  ?   ? ?Patients Stated Pain Goal: 6 (09/06/21 1439) ? ?Complications: No notable events documented. ?

## 2021-09-06 NOTE — Op Note (Signed)
[facility] ?Patient Name: Arthur Obrien ?Procedure Date: 09/06/2021 2:08 PM ?MRN: [id number] ?Date of Birth: Sep 26, 1940 ?Attending MD: Maylon Peppers ,  ?CSN: [id number] ?Age: 81 ?Admit Type: Outpatient ?Procedure:                Flexible Sigmoidoscopy ?Indications:              High risk colon cancer surveillance: Personal  ?                          history of colon cancer ?Providers:                Maylon Peppers, Rosina Lowenstein, RN, Caprice Kluver ?Referring MD:              ?Medicines:                Monitored Anesthesia Care ?Complications:            No immediate complications. ?Estimated Blood Loss:     Estimated blood loss: none. ?Procedure:                Pre-Anesthesia Assessment: ?                          - Prior to the procedure, a History and Physical  ?                          was performed, and patient medications, allergies  ?                          and sensitivities were reviewed. The patient's  ?                          tolerance of previous anesthesia was reviewed. ?                          - The risks and benefits of the procedure and the  ?                          sedation options and risks were discussed with the  ?                          patient. All questions were answered and informed  ?                          consent was obtained. ?                          - ASA Grade Assessment: II - A patient with mild  ?                          systemic disease. ?                          After obtaining informed consent, the scope was  ?                          passed under direct vision. The  [id number]-[id number]  ?                          ([id number]) was introduced through the anus and  ?                          advanced to the the rectum. The flexible  ?                          sigmoidoscopy was accomplished without difficulty.  ?                          The patient tolerated the procedure well. The  ?                          quality of the bowel preparation was excellent. ?Scope In: 2:30:29  PM ?Scope Out: 2:32:03 PM ?Total Procedure Duration: 0 hours 1 minute 34 seconds  ?Findings: ?     The perianal and digital rectal examinations were normal. ?     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     length. 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 ?Impression:               - Hartmann's pouch, characterized by healthy  ?                          appearing mucosa - changes of diversion proctitis. ?                          - No specimens collected. ?Moderate Sedation: ?     Per Anesthesia Care ?Recommendation:           - Discharge patient to home (ambulatory). ?                          - Resume previous diet. ?                          - Repeat flexible sigmoidoscopy in 5 years for  ?                          surveillance. ?Procedure Code(s):        --- Professional --- ?                          G0104, 52, Colorectal cancer screening; flexible  ?                          sigmoidoscopy ?Diagnosis Code(s):        --- Professional --- ?                          L87.564, Personal history of other malignant  ?                          neoplasm of large intestine ?CPT copyright 2019  American Medical Association. All rights reserved. ?The codes documented in this report are preliminary and upon coder review may  ?be revised to meet current compliance requirements. ?Maylon Peppers, MD ?Maylon Peppers,  ?09/06/2021 2:39:20 PM ?This report has been signed electronically. ?Number of Addenda: 0 ?

## 2021-09-06 NOTE — Interval H&P Note (Signed)
History and Physical Interval Note: ? ?09/06/2021 ?12:57 PM ? ?Arthur Obrien  has presented today for surgery, with the diagnosis of Epiagastric Pain hx of colon polyps.  The various methods of treatment have been discussed with the patient and family. After consideration of risks, benefits and other options for treatment, the patient has consented to  Procedure(s) with comments: ?ESOPHAGOGASTRODUODENOSCOPY (EGD) WITH PROPOFOL (N/A) - 100 ASA 2 ?FLEXIBLE SIGMOIDOSCOPY (N/A) as a surgical intervention.  The patient's history has been reviewed, patient examined, no change in status, stable for surgery.  I have reviewed the patient's chart and labs.  Questions were answered to the patient's satisfaction.   ? ? ?[name] ? ? ?

## 2021-09-06 NOTE — Anesthesia Preprocedure Evaluation (Signed)
Anesthesia Evaluation  ?Patient identified by MRN, date of birth, ID band ?Patient awake ? ? ? ?Reviewed: ?Allergy & Precautions, H&P , NPO status , Patient's Chart, lab work & pertinent test results, reviewed documented beta blocker date and time  ? ?Airway ?Mallampati: II ? ?TM Distance: >3 FB ?Neck ROM: full ? ? ? Dental ?no notable dental hx. ? ?  ?Pulmonary ?neg pulmonary ROS,  ?  ?Pulmonary exam normal ?breath sounds clear to auscultation ? ? ? ? ? ? Cardiovascular ?Exercise Tolerance: Good ?hypertension, + dysrhythmias Atrial Fibrillation  ?Rhythm:irregular Rate:Normal ? ? ?  ?Neuro/Psych ?negative neurological ROS ? negative psych ROS  ? GI/Hepatic ?Neg liver ROS, GERD  Medicated,  ?Endo/Other  ?negative endocrine ROS ? Renal/GU ?negative Renal ROS  ?negative genitourinary ?  ?Musculoskeletal ? ? Abdominal ?  ?Peds ? Hematology ?negative hematology ROS ?(+)   ?Anesthesia Other Findings ? ? Reproductive/Obstetrics ?negative OB ROS ? ?  ? ? ? ? ? ? ? ? ? ? ? ? ? ?  ?  ? ? ? ? ? ? ? ? ?Anesthesia Physical ?Anesthesia Plan ? ?ASA: 3 ? ?Anesthesia Plan: General  ? ?Post-op Pain Management:   ? ?Induction:  ? ?PONV Risk Score and Plan: Propofol infusion ? ?Airway Management Planned:  ? ?Additional Equipment:  ? ?Intra-op Plan:  ? ?Post-operative Plan:  ? ?Informed Consent: I have reviewed the patients History and Physical, chart, labs and discussed the procedure including the risks, benefits and alternatives for the proposed anesthesia with the patient or authorized representative who has indicated his/her understanding and acceptance.  ? ? ? ?Dental Advisory Given ? ?Plan Discussed with: CRNA ? ?Anesthesia Plan Comments:   ? ? ? ? ? ? ?Anesthesia Quick Evaluation ? ?

## 2021-09-06 NOTE — Discharge Instructions (Addendum)
You are being discharged to home.  ?Resume your previous diet.  ?We are waiting for your pathology results.  ?Continue your present medications.  ?Restart Xarelto tonight. ?Your physician has recommended a repeat flexible sigmoidoscopy in five years for surveillance.  ? ? ?

## 2021-09-07 LAB — H. PYLORI ANTIBODY, IGG: H Pylori IgG: 0.47 Index Value (ref 0.00–0.79)

## 2021-09-08 LAB — SURGICAL PATHOLOGY

## 2021-09-08 NOTE — Anesthesia Postprocedure Evaluation (Signed)
Anesthesia Post Note ? ?Patient: Arthur Obrien ? ?Procedure(s) Performed: ESOPHAGOGASTRODUODENOSCOPY (EGD) WITH PROPOFOL ?FLEXIBLE SIGMOIDOSCOPY ?BIOPSY ? ?Patient location during evaluation: Phase II ?Anesthesia Type: General ?Level of consciousness: awake ?Pain management: pain level controlled ?Vital Signs Assessment: post-procedure vital signs reviewed and stable ?Respiratory status: spontaneous breathing and respiratory function stable ?Cardiovascular status: blood pressure returned to baseline and stable ?Postop Assessment: no headache and no apparent nausea or vomiting ?Anesthetic complications: no ?Comments: Late entry ? ? ?No notable events documented. ? ? ?Last Vitals:  ?Vitals:  ? 09/06/21 1439 09/06/21 1501  ?BP:  114/76  ?Pulse: 85   ?Resp: 18   ?Temp: (!) 36.4 ?C   ?SpO2: 96%   ?  ?Last Pain:  ?Vitals:  ? 09/07/21 1122  ?TempSrc:   ?PainSc: 0-No pain  ? ? ?  ?  ?  ?  ?  ?  ? ?Louann Sjogren ? ? ? ? ?

## 2021-09-14 ENCOUNTER — Encounter (HOSPITAL_COMMUNITY): Payer: Self-pay | Admitting: Gastroenterology

## 2021-10-13 DIAGNOSIS — Z933 Colostomy status: Secondary | ICD-10-CM | POA: Diagnosis not present

## 2021-10-13 DIAGNOSIS — Z932 Ileostomy status: Secondary | ICD-10-CM | POA: Diagnosis not present

## 2021-10-13 DIAGNOSIS — Z85038 Personal history of other malignant neoplasm of large intestine: Secondary | ICD-10-CM | POA: Diagnosis not present

## 2021-11-30 ENCOUNTER — Other Ambulatory Visit: Payer: Self-pay | Admitting: *Deleted

## 2021-11-30 DIAGNOSIS — I4821 Permanent atrial fibrillation: Secondary | ICD-10-CM

## 2021-11-30 MED ORDER — RIVAROXABAN 20 MG PO TABS: ORAL_TABLET | ORAL | 1 refills | Status: DC

## 2021-11-30 NOTE — Telephone Encounter (Signed)
Xarelto '20mg'$  refill request received. Pt is 81 years old, weight-83.9kg, Crea-1.29 on 07/20/2021 via [facility] from [facility], last seen by Dr. Caryl Comes on 07/14/2021, Diagnosis-Afib, CrCl-51.43m/min; Dose is appropriate based on dosing criteria. Will send in refill to requested pharmacy.

## 2021-12-02 DIAGNOSIS — Z1329 Encounter for screening for other suspected endocrine disorder: Secondary | ICD-10-CM | POA: Diagnosis not present

## 2022-04-10 DIAGNOSIS — R040 Epistaxis: Secondary | ICD-10-CM | POA: Diagnosis not present

## 2022-05-02 ENCOUNTER — Other Ambulatory Visit: Payer: Self-pay | Admitting: *Deleted

## 2022-05-02 DIAGNOSIS — I4821 Permanent atrial fibrillation: Secondary | ICD-10-CM

## 2022-05-02 MED ORDER — RIVAROXABAN 20 MG PO TABS: ORAL_TABLET | ORAL | 0 refills | Status: DC

## 2022-05-02 NOTE — Telephone Encounter (Signed)
Prescription refill request for Xarelto received.   Indication: afib  Last office visit: Caryl Comes 07/14/2021 Weight: 83.9 kg  Age: 82 yo Scr: 1.29, 07/20/2021 CrCl: 51 ml/min   Refill sent

## 2022-05-10 ENCOUNTER — Other Ambulatory Visit: Payer: Self-pay

## 2022-05-10 MED ORDER — DILTIAZEM HCL ER BEADS 120 MG PO CP24: 120.0000 mg | ORAL_CAPSULE | Freq: Two times a day (BID) | ORAL | 0 refills | Status: DC

## 2022-05-23 DIAGNOSIS — Z932 Ileostomy status: Secondary | ICD-10-CM | POA: Diagnosis not present

## 2022-05-25 DIAGNOSIS — Z932 Ileostomy status: Secondary | ICD-10-CM | POA: Diagnosis not present

## 2022-06-10 DIAGNOSIS — Z932 Ileostomy status: Secondary | ICD-10-CM | POA: Diagnosis not present

## 2022-06-27 DIAGNOSIS — Z932 Ileostomy status: Secondary | ICD-10-CM | POA: Diagnosis not present

## 2022-06-27 DIAGNOSIS — Z933 Colostomy status: Secondary | ICD-10-CM | POA: Diagnosis not present

## 2022-06-27 DIAGNOSIS — Z85038 Personal history of other malignant neoplasm of large intestine: Secondary | ICD-10-CM | POA: Diagnosis not present

## 2022-06-29 DIAGNOSIS — Z932 Ileostomy status: Secondary | ICD-10-CM | POA: Diagnosis not present

## 2022-07-19 DIAGNOSIS — Z1329 Encounter for screening for other suspected endocrine disorder: Secondary | ICD-10-CM | POA: Diagnosis not present

## 2022-07-19 DIAGNOSIS — K219 Gastro-esophageal reflux disease without esophagitis: Secondary | ICD-10-CM | POA: Diagnosis not present

## 2022-07-19 DIAGNOSIS — Z0001 Encounter for general adult medical examination with abnormal findings: Secondary | ICD-10-CM | POA: Diagnosis not present

## 2022-07-19 DIAGNOSIS — I10 Essential (primary) hypertension: Secondary | ICD-10-CM | POA: Diagnosis not present

## 2022-07-19 DIAGNOSIS — I480 Paroxysmal atrial fibrillation: Secondary | ICD-10-CM | POA: Diagnosis not present

## 2022-07-19 DIAGNOSIS — R946 Abnormal results of thyroid function studies: Secondary | ICD-10-CM | POA: Diagnosis not present

## 2022-07-25 DIAGNOSIS — Z1389 Encounter for screening for other disorder: Secondary | ICD-10-CM | POA: Diagnosis not present

## 2022-07-25 DIAGNOSIS — Z0000 Encounter for general adult medical examination without abnormal findings: Secondary | ICD-10-CM | POA: Diagnosis not present

## 2022-07-25 DIAGNOSIS — R030 Elevated blood-pressure reading, without diagnosis of hypertension: Secondary | ICD-10-CM | POA: Diagnosis not present

## 2022-07-26 DIAGNOSIS — R946 Abnormal results of thyroid function studies: Secondary | ICD-10-CM | POA: Diagnosis not present

## 2022-07-26 DIAGNOSIS — E038 Other specified hypothyroidism: Secondary | ICD-10-CM | POA: Diagnosis not present

## 2022-07-26 DIAGNOSIS — E041 Nontoxic single thyroid nodule: Secondary | ICD-10-CM | POA: Diagnosis not present

## 2022-08-02 ENCOUNTER — Other Ambulatory Visit: Payer: Self-pay | Admitting: *Deleted

## 2022-08-02 ENCOUNTER — Other Ambulatory Visit: Payer: Self-pay

## 2022-08-02 DIAGNOSIS — I4821 Permanent atrial fibrillation: Secondary | ICD-10-CM

## 2022-08-02 MED ORDER — ATENOLOL 50 MG PO TABS: 50.0000 mg | ORAL_TABLET | Freq: Every day | ORAL | 0 refills | Status: DC

## 2022-08-02 MED ORDER — DILTIAZEM HCL ER BEADS 120 MG PO CP24: 120.0000 mg | ORAL_CAPSULE | Freq: Two times a day (BID) | ORAL | 0 refills | Status: DC

## 2022-08-02 MED ORDER — RIVAROXABAN 20 MG PO TABS: ORAL_TABLET | ORAL | 2 refills | Status: DC

## 2022-08-02 NOTE — Telephone Encounter (Signed)
Labs received from PCP - Scr 1.37 on 07/19/22.  Pt scheduled appt with Dr Caryl Comes on 11/03/22.   Appropriate dose. Refill sent.

## 2022-08-02 NOTE — Telephone Encounter (Signed)
Received labs from PCP, [facility]  via fax. Placed in pt's chart under media tab

## 2022-08-02 NOTE — Addendum Note (Signed)
Addended by: Leonidas Romberg on: 08/02/2022 02:30 PM   Modules accepted: Orders

## 2022-08-02 NOTE — Telephone Encounter (Signed)
Prescription refill request for Xarelto received.  Indication: Afib  Last office visit: 07/14/21 Caryl Comes)  Weight: 83.9kg Age: 82 Scr: 1.29 (07/20/21) CrCl:   Office visit and labs overdue. Called pt and left message x 3. Will await for pt to return call prior to refilling medication.

## 2022-08-07 DIAGNOSIS — Z932 Ileostomy status: Secondary | ICD-10-CM | POA: Diagnosis not present

## 2022-08-10 DIAGNOSIS — Z932 Ileostomy status: Secondary | ICD-10-CM | POA: Diagnosis not present

## 2022-08-10 DIAGNOSIS — Z933 Colostomy status: Secondary | ICD-10-CM | POA: Diagnosis not present

## 2022-08-10 DIAGNOSIS — Z85038 Personal history of other malignant neoplasm of large intestine: Secondary | ICD-10-CM | POA: Diagnosis not present

## 2022-09-26 DIAGNOSIS — Z85038 Personal history of other malignant neoplasm of large intestine: Secondary | ICD-10-CM | POA: Diagnosis not present

## 2022-09-26 DIAGNOSIS — Z933 Colostomy status: Secondary | ICD-10-CM | POA: Diagnosis not present

## 2022-09-26 DIAGNOSIS — Z932 Ileostomy status: Secondary | ICD-10-CM | POA: Diagnosis not present

## 2022-10-31 ENCOUNTER — Ambulatory Visit: Payer: Medicare Other | Attending: Internal Medicine | Admitting: Internal Medicine

## 2022-10-31 VITALS — BP 112/70 | HR 48 | Ht 70.0 in | Wt 188.6 lb

## 2022-10-31 DIAGNOSIS — I4821 Permanent atrial fibrillation: Secondary | ICD-10-CM | POA: Diagnosis not present

## 2022-10-31 DIAGNOSIS — R001 Bradycardia, unspecified: Secondary | ICD-10-CM | POA: Diagnosis not present

## 2022-10-31 LAB — BASIC METABOLIC PANEL
BUN/Creatinine Ratio: 11 (ref 10–24)
BUN: 13 mg/dL (ref 8–27)
CO2: 19 mmol/L — ABNORMAL LOW (ref 20–29)
Calcium: 9.1 mg/dL (ref 8.6–10.2)
Chloride: 106 mmol/L (ref 96–106)
Creatinine, Ser: 1.14 mg/dL (ref 0.76–1.27)
Glucose: 93 mg/dL (ref 70–99)
Potassium: 4.5 mmol/L (ref 3.5–5.2)
Sodium: 140 mmol/L (ref 134–144)
eGFR: 63 mL/min/{1.73_m2} (ref >59)

## 2022-10-31 MED ORDER — DILTIAZEM HCL 30 MG PO TABS: ORAL_TABLET | ORAL | 1 refills | Status: AC

## 2022-10-31 MED ORDER — RIVAROXABAN 20 MG PO TABS: 20.0000 mg | ORAL_TABLET | Freq: Every day | ORAL | 3 refills | Status: DC

## 2022-10-31 MED ORDER — DILTIAZEM HCL ER BEADS 120 MG PO CP24: 120.0000 mg | ORAL_CAPSULE | Freq: Two times a day (BID) | ORAL | 3 refills | Status: DC

## 2022-10-31 NOTE — Progress Notes (Signed)
Patient Care Team: Selinda Flavin, MD as PCP - General (Family Medicine) Duke Salvia, MD as Consulting Physician (Clinical Cardiac Electrophysiology)   HPI  Arthur Obrien is a 82 y.o. male Seen in followup for atrial fibrillation now permanent with a moderately rapid ventricular response in the setting of sinus bradycardia. He underwent ileostomy with an end colostomy 1/19 for rectal cancer.      Found that atenolol was most beneficial.  Also on diltiazem.  Exertional heart rates are about 110.  Resting rates are in the 70s and 80s     Date Cr K Hgb  3/18    12.2  1/19 0.89  8.9  3/19 0.97  11.2  12/19 1.05 4.1 11.8  1/22 1.13 4.4 14.0  3/23 1.29 4.6   3/27   14.1    The patient denies chest pain, shortness of breath, nocturnal dyspnea, orthopnea or peripheral edema.  There have been no palpitations, lightheadedness or syncope.  Complains of .   DATE TEST EF   6/14 Echo   65 % LAE 47           Thromboembolic risk factors ( age  -2, HTN-1, ) for a CHADSVASc Score of 3   Past Medical History:  Diagnosis Date   Abdominal pain, epigastric 12/14/2016   Colon cancer (HCC) 1992   rectal stage 3   Colon polyps    Colon polyps    Family history of breast cancer    GERD (gastroesophageal reflux disease)    HTN (hypertension)    Hx of echocardiogram    a.  Echo (6/14):  EF 55-60%, mild-mod LAE, normal RV function, mild MR, RVSP 37 mmHg, mild pulmonary hypertension     Hypercholesterolemia 02/08/2012   PAF (paroxysmal atrial fibrillation) (HCC) 09/26/2012   Rectal cancer (HCC) 02/08/2012   Stage III cancer of the rectum with resection earlier in 1992, followed by combination chemotherapy with 5-FU, leucovorin and levamisole in conjunction with radiation therapy to the pelvis by Dr. Kathrin Greathouse, and he has had no evidence of recurrence thus far. His last colonoscopy was by Dr. Elmyra Ricks in November 2010 and for his next one, he would like to see Dr. Lionel December.   Dr. Carley Hammed   Sinus bradycardia 09/26/2012   UARS (upper airway resistance syndrome)    08-21-17 SLEEP STUDY NO CPAP NEEDED    Past Surgical History:  Procedure Laterality Date   BIOPSY  01/10/2017   Procedure: BIOPSY;  Surgeon: Malissa Hippo, MD;  [facility];  Service: Endoscopy;;  polyps @ body and fundus   BIOPSY  09/06/2021   Procedure: BIOPSY;  Surgeon: Dolores Frame, MD;  [facility];  Service: Gastroenterology;;   CATARACT EXTRACTION Bilateral    right(years ago) left 06/2010   COLECTOMY N/A 05/11/2017   Procedure: OPEN COMPLETION COLECTOMY WITH ILEOSTOMY;  Surgeon: Andria Meuse, MD;  [facility];  Service: General;  Laterality: N/A;   COLON SURGERY     COLONOSCOPY N/A 01/10/2017   Procedure: COLONOSCOPY;  Surgeon: Malissa Hippo, MD;  [facility];  Service: Endoscopy;  Laterality: N/A;   COLONOSCOPY WITH PROPOFOL N/A 02/28/2017   Procedure: COLONOSCOPY WITH PROPOFOL WITH TATTOO;  Surgeon: Andria Meuse, MD;  Location: [facility];  Service: General;  Laterality: N/A;   CYSTOSCOPY WITH STENT PLACEMENT N/A 05/11/2017   Procedure: CYSTOSCOPY WITH BILATERAL STENT PLACEMENT;  Surgeon: Marcine Matar, MD;  [facility];  Service: Urology;  Laterality: N/A;   ESOPHAGOGASTRODUODENOSCOPY N/A 01/10/2017   Procedure: ESOPHAGOGASTRODUODENOSCOPY (EGD);  Surgeon: Malissa Hippo, MD;  [facility];  Service: Endoscopy;  Laterality: N/A;  12:45-rescheduled to 9/12 @ 2:00pm per Dewayne Hatch   ESOPHAGOGASTRODUODENOSCOPY (EGD) WITH PROPOFOL N/A 09/06/2021   Procedure: ESOPHAGOGASTRODUODENOSCOPY (EGD) WITH PROPOFOL;  Surgeon: Dolores Frame, MD;  [facility];  Service: Gastroenterology;  Laterality: N/A;  100 ASA 2   FLEXIBLE SIGMOIDOSCOPY N/A 05/02/2018   Procedure: FLEXIBLE SIGMOIDOSCOPY;  Surgeon: Andria Meuse, MD;  Location: [facility];  Service: General;  Laterality: N/A;   FLEXIBLE  SIGMOIDOSCOPY N/A 09/06/2021   Procedure: Arnell Sieving;  Surgeon: Marguerita Merles, Reuel Boom, MD;  [facility];  Service: Gastroenterology;  Laterality: N/A;   ILEOSTOMY N/A 05/11/2017   Procedure: ILEOSTOMY;  Surgeon: Andria Meuse, MD;  [facility];  Service: General;  Laterality: N/A;   POLYPECTOMY  01/10/2017   Procedure: POLYPECTOMY;  Surgeon: Malissa Hippo, MD;  [facility];  Service: Endoscopy;;  ascending colon, transverse colon, and splenic flexure   TONSILLECTOMY  AGE 40    Current Outpatient Medications  Medication Sig Dispense Refill   cetirizine (ZYRTEC ALLERGY) 10 MG tablet Take 10 mg by mouth daily.     acetaminophen (TYLENOL) 500 MG tablet Take 1,000 mg by mouth every 6 (six) hours as needed for mild pain or headache.     atenolol (TENORMIN) 50 MG tablet Take 1 tablet (50 mg total) by mouth daily. Pt needs appt for # 90 day supply or any future refills. 30 tablet 0   Cyanocobalamin (VITAMIN B-12) 2500 MCG SUBL Place 2,500 mcg under the tongue daily.     DENTA 5000 PLUS 1.1 % CREA dental cream Place 1 application. onto teeth at bedtime.     diltiazem (CARDIZEM) 30 MG tablet TAKE ONE TABLET BY MOUTH EVERY 4 HOURS AS NEEDED FOR HEART PALPITATIONS/RACING 90 tablet 2   diltiazem (TIADYLT ER) 120 MG 24 hr capsule Take 1 capsule (120 mg total) by mouth 2 (two) times daily. Patient needs appt for any future refills. Please call office to schedule appt. 60 capsule 0   famotidine (PEPCID) 20 MG tablet Take 20 mg by mouth daily.     Hypromellose (ARTIFICIAL TEARS OP) Place 1 drop into both eyes every 4 (four) hours as needed (for dry eyes).      Multiple Vitamin (MULTIVITAMIN) tablet Take 1 tablet by mouth daily.     rivaroxaban (XARELTO) 20 MG TABS tablet TAKE ONE TABLET BY MOUTH DAILY WITH SUPPER 30 tablet 2   No current facility-administered medications for this visit.    No Known Allergies  Review of Systems negative except from HPI and  PMH  Physical Exam BP 112/70   Pulse (!) 48   Ht 5\' 10"  (1.778 m)   Wt 188 lb 9.6 oz (85.5 kg)   SpO2 96%   BMI 27.06 kg/m  Well developed and nourished in no acute distress HENT normal Neck supple with JVP-flat Carotids brisk and full without bruits Clear Irregularly irregular rate and rhythm with controlled ventricular response, no murmurs or gallops Abd-soft with active BS without hepatomegaly No Clubbing cyanosis edema Skin-warm and dry A & Oriented  Grossly normal sensory and motor function  ECG atrial fib @ 85    Assessment and  Plan Atrial fibrillation-permanent//RVR   Hypertension  Anemia  Sleep disordered breathing and daytime somnolence  Status post colectomy with a colostomy for rectal cancer 1/19  BP well-controlled on the atenolol diltiazem  A-fib with well-controlled heart rate wise on the Tenormin and the Tiazac  No bleeding.  Continue Xarelto estimated GFR (CKD) from blood work a year ago was 55.  Will repeat BMP at

## 2022-10-31 NOTE — Patient Instructions (Signed)
Medication Instructions:  Your physician recommends that you continue on your current medications as directed. Please refer to the Current Medication list given to you today.  *If you need a refill on your cardiac medications before your next appointment, please call your pharmacy*   Lab Work: None ordered.  If you have labs (blood work) drawn today and your tests are completely normal, you will receive your results only by: MyChart Message (if you have MyChart) OR A paper copy in the mail If you have any lab test that is abnormal or we need to change your treatment, we will call you to review the results.   Testing/Procedures: BMET today   Follow-Up: At [facility], you and your health needs are our priority.  As part of our continuing mission to provide you with exceptional heart care, we have created designated Provider Care Teams.  These Care Teams include your primary Cardiologist (physician) and Advanced Practice Providers (APPs -  Physician Assistants and Nurse Practitioners) who all work together to provide you with the care you need, when you need it.  We recommend signing up for the patient portal called "MyChart".  Sign up information is provided on this After Visit Summary.  MyChart is used to connect with patients for Virtual Visits (Telemedicine).  Patients are able to view lab/test results, encounter notes, upcoming appointments, etc.  Non-urgent messages can be sent to your provider as well.   To learn more about what you can do with MyChart, go to [email or web address].    Your next appointment:   12 months with Dr Graciela Husbands

## 2022-11-03 ENCOUNTER — Ambulatory Visit: Payer: Medicare Other | Admitting: Internal Medicine

## 2022-11-10 DIAGNOSIS — Z933 Colostomy status: Secondary | ICD-10-CM | POA: Diagnosis not present

## 2022-11-10 DIAGNOSIS — Z85038 Personal history of other malignant neoplasm of large intestine: Secondary | ICD-10-CM | POA: Diagnosis not present

## 2022-11-10 DIAGNOSIS — Z932 Ileostomy status: Secondary | ICD-10-CM | POA: Diagnosis not present

## 2022-11-25 ENCOUNTER — Other Ambulatory Visit: Payer: Self-pay | Admitting: Internal Medicine

## 2022-11-28 ENCOUNTER — Other Ambulatory Visit: Payer: Self-pay | Admitting: *Deleted

## 2023-03-15 DIAGNOSIS — H578A2 Foreign body sensation, left eye: Secondary | ICD-10-CM | POA: Diagnosis not present

## 2023-03-15 DIAGNOSIS — H04123 Dry eye syndrome of bilateral lacrimal glands: Secondary | ICD-10-CM | POA: Diagnosis not present

## 2023-05-22 ENCOUNTER — Ambulatory Visit (HOSPITAL_COMMUNITY)
Admission: RE | Admit: 2023-05-22 | Discharge: 2023-05-22 | Disposition: A | Discharge status: Home / Self Care | Payer: Medicare Other | Source: Ambulatory Visit | Attending: Nurse Practitioner | Admitting: Nurse Practitioner

## 2023-05-22 DIAGNOSIS — Z432 Encounter for attention to ileostomy: Secondary | ICD-10-CM | POA: Diagnosis not present

## 2023-05-22 DIAGNOSIS — L24B3 Irritant contact dermatitis related to fecal or urinary stoma or fistula: Secondary | ICD-10-CM | POA: Insufficient documentation

## 2023-05-22 DIAGNOSIS — C189 Malignant neoplasm of colon, unspecified: Secondary | ICD-10-CM | POA: Diagnosis not present

## 2023-05-22 DIAGNOSIS — Z932 Ileostomy status: Secondary | ICD-10-CM | POA: Diagnosis present

## 2023-05-22 NOTE — Progress Notes (Signed)
[facility]   Reason for visit:  RUQ Ileostomy, long term.  Needs to get established with ostomy clinic for supplies and ongoing skin issues.  HPI:  Colon cancer with resection and ileostomy Past Medical History:  Diagnosis Date   Abdominal pain, epigastric 12/14/2016   Colon cancer (HCC) 1992   rectal stage 3   Colon polyps    Colon polyps    Family history of breast cancer    GERD (gastroesophageal reflux disease)    HTN (hypertension)    Hx of echocardiogram    a.  Echo (6/14):  EF 55-60%, mild-mod LAE, normal RV function, mild MR, RVSP 37 mmHg, mild pulmonary hypertension     Hypercholesterolemia 02/08/2012   PAF (paroxysmal atrial fibrillation) (HCC) 09/26/2012   Rectal cancer (HCC) 02/08/2012   Stage III cancer of the rectum with resection earlier in 1992, followed by combination chemotherapy with 5-FU, leucovorin and levamisole in conjunction with radiation therapy to the pelvis by Dr. Kathrin Greathouse, and he has had no evidence of recurrence thus far. His last colonoscopy was by Dr. Elmyra Ricks in November 2010 and for his next one, he would like to see Dr. Lionel December.  Dr. Carley Hammed   Sinus bradycardia 09/26/2012   UARS (upper airway resistance syndrome)    08-21-17 SLEEP STUDY NO CPAP NEEDED   Family History  Problem Relation Age of Onset   Hypertension Mother    Congestive Heart Failure Mother    Cancer Father        multiple myeloma   Breast cancer Daughter 35       died at 51, reportedly tested neg for breast cancer genes in 2008   Cancer Paternal Aunt        NOS   Heart attack Neg Hx    Stroke Neg Hx    No Known Allergies Current Outpatient Medications  Medication Sig Dispense Refill Last Dose/Taking   acetaminophen (TYLENOL) 500 MG tablet Take 1,000 mg by mouth every 6 (six) hours as needed for mild pain or headache.      atenolol (TENORMIN) 50 MG tablet TAKE ONE TABLET BY MOUTH EVERY DAY 90 tablet 3    cetirizine (ZYRTEC ALLERGY) 10 MG tablet  Take 10 mg by mouth daily.      Cyanocobalamin (VITAMIN B-12) 2500 MCG SUBL Place 2,500 mcg under the tongue daily.      DENTA 5000 PLUS 1.1 % CREA dental cream Place 1 application. onto teeth at bedtime.      diltiazem (CARDIZEM) 30 MG tablet TAKE ONE TABLET BY MOUTH EVERY 4 HOURS AS NEEDED FOR HEART PALPITATIONS/RACING 90 tablet 1    diltiazem (TIADYLT ER) 120 MG 24 hr capsule Take 1 capsule (120 mg total) by mouth 2 (two) times daily. 180 capsule 3    famotidine (PEPCID) 20 MG tablet Take 20 mg by mouth daily.      Hypromellose (ARTIFICIAL TEARS OP) Place 1 drop into both eyes every 4 (four) hours as needed (for dry eyes).       Multiple Vitamin (MULTIVITAMIN) tablet Take 1 tablet by mouth daily.      rivaroxaban (XARELTO) 20 MG TABS tablet Take 1 tablet (20 mg total) by mouth daily with supper. TAKE ONE TABLET BY MOUTH DAILY WITH SUPPER 90 tablet 3    No current facility-administered medications for this encounter.   ROS  Review of Systems  Gastrointestinal:        History colon cancer RUQ ileostomy  Skin:  Positive for color change.       Peristomal redness  Psychiatric/Behavioral: Negative.    All other systems reviewed and are negative.  Vital signs:  BP 137/78 (BP Location: Right Arm)   Pulse 73   Temp 98.4 F (36.9 C) (Oral)   Resp 18   SpO2 98%  Exam:  Physical Exam Vitals reviewed.  Constitutional:      Appearance: Normal appearance.  Cardiovascular:     Rate and Rhythm: Normal rate and regular rhythm.     Pulses: Normal pulses.     Heart sounds: Normal heart sounds.  Pulmonary:     Effort: Pulmonary effort is normal.     Breath sounds: Normal breath sounds.  Abdominal:     Palpations: Abdomen is soft.  Musculoskeletal:        General: Normal range of motion.  Skin:    General: Skin is warm and dry.     Findings: Erythema present.  Neurological:     Mental Status: He is alert and oriented to person, place, and time. Mental status is at baseline.   Psychiatric:        Mood and Affect: Mood normal.        Behavior: Behavior normal.     Stoma type/location:  RUQ ileostomy Stomal assessment/size:  1 3/8" budded pink and moist Peristomal assessment:  intact, occasional redness Treatment options for stomal/peristomal skin: stoma powder skin prep  1 piece convex pouch Output: liquid brown stool Ostomy pouching: 1pc.convex  Education provided:  Due to occasional leaks and loss of seal, I recommend adding barrier strips. The tape he uses irritates his skin at times.  Patient's insurance has switched to [place] for billing  [phone]  through [place].  He needs updated prescription for supplies.     Impression/dx  Ileostomy Contact dermatitis Discussion  See above Will update supplies Adding barrier strips Plan  See back as needed.     Visit time: 55 minutes.   Mike Gip FNP-BC

## 2023-05-22 NOTE — Discharge Instructions (Addendum)
[facility] know that the clinic will handle supplies [phone] phone   Mike Gip [phone]  fax [email or web address]

## 2023-05-29 ENCOUNTER — Other Ambulatory Visit (HOSPITAL_COMMUNITY): Payer: Self-pay | Admitting: Nurse Practitioner

## 2023-05-29 DIAGNOSIS — L24B3 Irritant contact dermatitis related to fecal or urinary stoma or fistula: Secondary | ICD-10-CM | POA: Insufficient documentation

## 2023-05-29 DIAGNOSIS — Z432 Encounter for attention to ileostomy: Secondary | ICD-10-CM | POA: Insufficient documentation

## 2023-07-19 DIAGNOSIS — I10 Essential (primary) hypertension: Secondary | ICD-10-CM | POA: Diagnosis not present

## 2023-07-19 DIAGNOSIS — R946 Abnormal results of thyroid function studies: Secondary | ICD-10-CM | POA: Diagnosis not present

## 2023-07-24 DIAGNOSIS — D649 Anemia, unspecified: Secondary | ICD-10-CM | POA: Diagnosis not present

## 2023-07-24 DIAGNOSIS — D529 Folate deficiency anemia, unspecified: Secondary | ICD-10-CM | POA: Diagnosis not present

## 2023-07-24 DIAGNOSIS — D519 Vitamin B12 deficiency anemia, unspecified: Secondary | ICD-10-CM | POA: Diagnosis not present

## 2023-07-26 DIAGNOSIS — D649 Anemia, unspecified: Secondary | ICD-10-CM | POA: Diagnosis not present

## 2023-07-26 DIAGNOSIS — Z0000 Encounter for general adult medical examination without abnormal findings: Secondary | ICD-10-CM | POA: Diagnosis not present

## 2023-07-26 DIAGNOSIS — I4891 Unspecified atrial fibrillation: Secondary | ICD-10-CM | POA: Diagnosis not present

## 2023-07-26 DIAGNOSIS — E038 Other specified hypothyroidism: Secondary | ICD-10-CM | POA: Diagnosis not present

## 2023-07-31 ENCOUNTER — Other Ambulatory Visit: Payer: Self-pay | Admitting: Internal Medicine

## 2023-08-02 ENCOUNTER — Other Ambulatory Visit: Payer: Self-pay | Admitting: Internal Medicine

## 2023-08-28 DIAGNOSIS — R300 Dysuria: Secondary | ICD-10-CM | POA: Diagnosis not present

## 2023-08-28 DIAGNOSIS — R310 Gross hematuria: Secondary | ICD-10-CM | POA: Diagnosis not present

## 2023-08-28 DIAGNOSIS — L918 Other hypertrophic disorders of the skin: Secondary | ICD-10-CM | POA: Diagnosis not present

## 2023-08-30 DIAGNOSIS — R310 Gross hematuria: Secondary | ICD-10-CM | POA: Diagnosis not present

## 2023-08-30 DIAGNOSIS — Z85038 Personal history of other malignant neoplasm of large intestine: Secondary | ICD-10-CM | POA: Diagnosis not present

## 2023-10-31 ENCOUNTER — Other Ambulatory Visit: Payer: Self-pay | Admitting: Internal Medicine

## 2023-10-31 DIAGNOSIS — I4821 Permanent atrial fibrillation: Secondary | ICD-10-CM

## 2023-10-31 MED ORDER — ATENOLOL 50 MG PO TABS: 50.0000 mg | ORAL_TABLET | Freq: Every day | ORAL | 3 refills | Status: AC

## 2023-10-31 NOTE — Telephone Encounter (Signed)
 Prescription refill request for Xarelto  received.  Indication: AF Last office visit: 10/31/22  GORMAN Sage MD Weight: 85.5kg Age: 83 Scr: 1.14 on 10/31/22 CrCl: 57.29  Based on above findings Xarelto  20mg  daily is the appropriate dose.  Patient is past due for appt with MD and labs.  Message sent to scheduler to make appt.  Refill approved x 1.

## 2023-11-14 NOTE — Progress Notes (Unsigned)
 Cardiology Office Note Date:  11/14/2023  Patient ID:  Arthur Obrien December 12, 1940, MRN [id number] PCP:  Kayla Drivers, MD  Electrophysiologist:  Dr. Fernande    Chief Complaint:  *** annual visit  History of Present Illness: Arthur Obrien is a 83 y.o. male with history of  HTN, HLD (the patient denies high cholesterol, states his PMD follows his labs/lipids, has never been told was high),  colon and rectal ca (ileostomy). Sinus bradycardia AFib > now permanent  He saw Dr. Fernande 10/31/22, Exertional heart rates are about 110. Resting rates are in the 70s and 80s  Denied symptoms No changes were made  TODAY  *** xarelto , dose, bleeding, labs *** rates *** symptoms *** brady?  Past Medical History:  Diagnosis Date   Abdominal pain, epigastric 12/14/2016   Colon cancer (HCC) 1992   rectal stage 3   Colon polyps    Colon polyps    Family history of breast cancer    GERD (gastroesophageal reflux disease)    HTN (hypertension)    Hx of echocardiogram    a.  Echo (6/14):  EF 55-60%, mild-mod LAE, normal RV function, mild MR, RVSP 37 mmHg, mild pulmonary hypertension     Hypercholesterolemia 02/08/2012   PAF (paroxysmal atrial fibrillation) (HCC) 09/26/2012   Rectal cancer (HCC) 02/08/2012   Stage III cancer of the rectum with resection earlier in 1992, followed by combination chemotherapy with 5-FU, leucovorin and levamisole in conjunction with radiation therapy to the pelvis by Dr. Malachi Runner, and he has had no evidence of recurrence thus far. His last colonoscopy was by Dr. Victory Hu in November 2010 and for his next one, he would like to see Dr. Claudis Rivet.  Dr. Boby   Sinus bradycardia 09/26/2012   UARS (upper airway resistance syndrome)    08-21-17 SLEEP STUDY NO CPAP NEEDED    Past Surgical History:  Procedure Laterality Date   BIOPSY  01/10/2017   Procedure: BIOPSY;  Surgeon: Rivet Claudis PENNER, MD;  [facility];  Service: Endoscopy;;  polyps @  body and fundus   BIOPSY  09/06/2021   Procedure: BIOPSY;  Surgeon: Eartha Angelia Sieving, MD;  [facility];  Service: Gastroenterology;;   CATARACT EXTRACTION Bilateral    right(years ago) left 06/2010   COLECTOMY N/A 05/11/2017   Procedure: OPEN COMPLETION COLECTOMY WITH ILEOSTOMY;  Surgeon: Teresa Lonni HERO, MD;  [facility]: [facility];  Service: General;  Laterality: N/A;   COLON SURGERY     COLONOSCOPY N/A 01/10/2017   Procedure: COLONOSCOPY;  Surgeon: Rivet Claudis PENNER, MD;  [facility];  Service: Endoscopy;  Laterality: N/A;   COLONOSCOPY WITH PROPOFOL  N/A 02/28/2017   Procedure: COLONOSCOPY WITH PROPOFOL  WITH TATTOO;  Surgeon: Teresa Lonni HERO, MD;  Location: [facility];  Service: General;  Laterality: N/A;   CYSTOSCOPY WITH STENT PLACEMENT N/A 05/11/2017   Procedure: CYSTOSCOPY WITH BILATERAL STENT PLACEMENT;  Surgeon: Matilda Senior, MD;  [facility];  Service: Urology;  Laterality: N/A;   ESOPHAGOGASTRODUODENOSCOPY N/A 01/10/2017   Procedure: ESOPHAGOGASTRODUODENOSCOPY (EGD);  Surgeon: Rivet Claudis PENNER, MD;  [facility];  Service: Endoscopy;  Laterality: N/A;  12:45-rescheduled to 9/12 @ 2:00pm per Jenkins   ESOPHAGOGASTRODUODENOSCOPY (EGD) WITH PROPOFOL  N/A 09/06/2021   Procedure: ESOPHAGOGASTRODUODENOSCOPY (EGD) WITH PROPOFOL ;  Surgeon: Eartha Angelia Sieving, MD;  [facility];  Service: Gastroenterology;  Laterality: N/A;  100 ASA 2   FLEXIBLE SIGMOIDOSCOPY N/A 05/02/2018   Procedure: FLEXIBLE SIGMOIDOSCOPY;  Surgeon: Teresa Lonni HERO, MD;  Location: [facility];  Service: General;  Laterality: N/A;   FLEXIBLE SIGMOIDOSCOPY N/A 09/06/2021   Procedure: ENID MORIN;  Surgeon: Eartha Flavors, Toribio, MD;  [facility];  Service: Gastroenterology;  Laterality: N/A;   ILEOSTOMY N/A 05/11/2017   Procedure: ILEOSTOMY;  Surgeon: Teresa Lonni HERO, MD;  [facility];  Service: General;  Laterality: N/A;    POLYPECTOMY  01/10/2017   Procedure: POLYPECTOMY;  Surgeon: Golda Claudis PENNER, MD;  [facility];  Service: Endoscopy;;  ascending colon, transverse colon, and splenic flexure   TONSILLECTOMY  AGE 55    Current Outpatient Medications  Medication Sig Dispense Refill   acetaminophen  (TYLENOL ) 500 MG tablet Take 1,000 mg by mouth every 6 (six) hours as needed for mild pain or headache.     atenolol  (TENORMIN ) 50 MG tablet Take 1 tablet (50 mg total) by mouth daily. 90 tablet 3   cetirizine (ZYRTEC ALLERGY) 10 MG tablet Take 10 mg by mouth daily.     Cyanocobalamin  (VITAMIN B-12) 2500 MCG SUBL Place 2,500 mcg under the tongue daily.     DENTA 5000 PLUS 1.1 % CREA dental cream Place 1 application. onto teeth at bedtime.     diltiazem  (CARDIZEM ) 30 MG tablet TAKE ONE TABLET BY MOUTH EVERY 4 HOURS AS NEEDED FOR HEART PALPITATIONS/RACING 90 tablet 1   diltiazem  (TIAZAC ) 120 MG 24 hr capsule TAKE 1 CAPSULE BY MOUTH TWICE DAILY 30 capsule 0   famotidine (PEPCID) 20 MG tablet Take 20 mg by mouth daily.     Hypromellose (ARTIFICIAL TEARS OP) Place 1 drop into both eyes every 4 (four) hours as needed (for dry eyes).      Multiple Vitamin (MULTIVITAMIN) tablet Take 1 tablet by mouth daily.     rivaroxaban  (XARELTO ) 20 MG TABS tablet TAKE ONE TABLET BY MOUTH EVERY DAY WITH SUPPER 90 tablet 0   No current facility-administered medications for this visit.    Allergies:   Patient has no known allergies.   Social History:  The patient  reports that he has never smoked. He has never been exposed to tobacco smoke. He has never used smokeless tobacco. He reports that he does not drink alcohol  and does not use drugs.   Family History:  The patient's family history includes Breast cancer (age of onset: 85) in his daughter; Cancer in his father and paternal aunt; Congestive Heart Failure in his mother; Hypertension in his mother.  ROS:  Please see the history of present illness. All other systems are  reviewed and otherwise negative.   PHYSICAL EXAM:  VS:  There were no vitals taken for this visit. BMI: There is no height or weight on file to calculate BMI. Well nourished, well developed, in no acute distress  HEENT: normocephalic, atraumatic  Neck: no JVD, carotid bruits or masses Cardiac:  *** irreg-irreg; no significant murmurs are appreciated no rubs, or gallops Lungs:  *** CTA b/l, no wheezing, rhonchi or rales  Abd: soft, non-tender MS: no deformity, age appropriate atrophy Ext: ***  no edema  Skin: warm and dry, no rash Neuro:  No gross deficits appreciated Psych: euthymic mood, full affect   EKG:  Done today and reviewed by myself ***  06/26/12: TTE LVEF 55-60%, LA mild-mod dilated Mild AV calcification, mild AI Mild MR RV Mild p.HTN   Recent Labs: No results found for requested labs within last 365 days.  No results found for requested labs within last  365 days.   CrCl cannot be calculated (Patient's most recent lab result is older than the maximum 21 days allowed.).   Wt Readings from Last 3 Encounters:  10/31/22 188 lb 9.6 oz (85.5 kg)  09/06/21 185 lb (83.9 kg)  08/26/21 187 lb 11.2 oz (85.1 kg)     Other studies reviewed: Additional studies/records reviewed today include: summarized above  ASSESSMENT AND PLAN:  1. Permanent Afib     CHA2DS2Vasc is at least 3, on Xarelto , *** appropriately dosed     *** No symptoms of bradycardia     ***  2. HTN     *** Looks OK     *** no changes    Disposition: ***  sooner if needed.    Current medicines are reviewed at length with the patient today.  The patient did not have any concerns regarding medicines.  Bonney Charlies Arthur, PA-C 11/14/2023 8:28 AM     [facility] [address] [place] [address] [phone] (office)  [phone] (fax)

## 2023-11-15 ENCOUNTER — Ambulatory Visit: Attending: Physician Assistant | Admitting: Physician Assistant

## 2023-11-15 ENCOUNTER — Encounter: Payer: Self-pay | Admitting: Physician Assistant

## 2023-11-15 VITALS — BP 126/74 | HR 79 | Ht 70.0 in | Wt 189.2 lb

## 2023-11-15 DIAGNOSIS — I4821 Permanent atrial fibrillation: Secondary | ICD-10-CM

## 2023-11-15 DIAGNOSIS — I10 Essential (primary) hypertension: Secondary | ICD-10-CM

## 2023-11-15 DIAGNOSIS — D6869 Other thrombophilia: Secondary | ICD-10-CM

## 2023-11-15 NOTE — Patient Instructions (Addendum)
 Medication Instructions:   Your physician recommends that you continue on your current medications as directed. Please refer to the Current Medication list given to you today.   *If you need a refill on your cardiac medications before your next appointment, please call your pharmacy*   Lab Work:  NONE ORDERED  TODAY ( MOST RECENT LABWORK REQUESTED FROM PRIMARY )   If you have labs (blood work) drawn today and your tests are completely normal, you will receive your results only by: MyChart Message (if you have MyChart) OR A paper copy in the mail If you have any lab test that is abnormal or we need to change your treatment, we will call you to review the results.   Testing/Procedures: NONE ORDERED  TODAY    Follow-Up: At [facility], you and your health needs are our priority.  As part of our continuing mission to provide you with exceptional heart care, our providers are all part of one team.  This team includes your primary Cardiologist (physician) and Advanced Practice Providers or APPs (Physician Assistants and Nurse Practitioners) who all work together to provide you with the care you need, when you need it.   Your next appointment:    1 year(s)   Provider:    Charlies Arthur, PA-C   We recommend signing up for the patient portal called MyChart.  Sign up information is provided on this After Visit Summary.  MyChart is used to connect with patients for Virtual Visits (Telemedicine).  Patients are able to view lab/test results, encounter notes, upcoming appointments, etc.  Non-urgent messages can be sent to your provider as well.   To learn more about what you can do with MyChart, go to [email or web address].   Other Instructions

## 2023-12-05 ENCOUNTER — Other Ambulatory Visit: Payer: Self-pay | Admitting: Internal Medicine

## 2024-01-31 ENCOUNTER — Other Ambulatory Visit: Payer: Self-pay | Admitting: Internal Medicine

## 2024-01-31 DIAGNOSIS — I4821 Permanent atrial fibrillation: Secondary | ICD-10-CM

## 2024-01-31 NOTE — Telephone Encounter (Signed)
 Prescription refill request for Xarelto  received.  Indication: afib  Last office visit: 11/15/2023 Weight: 85.8 kg  Age: 83 yo  Scr: 1.14, 10/31/2022 CrCl:

## 2024-03-03 ENCOUNTER — Other Ambulatory Visit: Payer: Self-pay | Admitting: Physician Assistant

## 2024-06-06 ENCOUNTER — Other Ambulatory Visit: Payer: Self-pay

## 2024-06-06 ENCOUNTER — Telehealth: Payer: Self-pay | Admitting: Physician Assistant

## 2024-06-06 DIAGNOSIS — I4821 Permanent atrial fibrillation: Secondary | ICD-10-CM

## 2024-06-06 MED ORDER — RIVAROXABAN 20 MG PO TABS: 20.0000 mg | ORAL_TABLET | Freq: Every day | ORAL | 1 refills | Status: AC

## 2024-06-06 NOTE — Telephone Encounter (Signed)
" °*  STAT* If patient is at the pharmacy, call can be transferred to refill team.   1. Which medications need to be refilled? (please list name of each medication and dose if known)   XARELTO  20 MG TABS tablet   2. Would you like to learn more about the convenience, safety, & potential cost savings by using the [facility]?   3. Are you open to using the [facility] (Type [facility]. ).  4. Which pharmacy/location (including street and city if local pharmacy) is medication to be sent to?  [facility] - [place], [place] - [address]   5. Do they need a 30 day or 90 day supply?   30 day  Caller [facility]) stated patient is out of this medication.    "
